# Patient Record
Sex: Female | Born: 1985 | Race: Black or African American | Hispanic: No | Marital: Single | Smoking: Current some day smoker
Health system: Southern US, Community
[De-identification: ages and names within clinical notes are randomized; demographics above are authoritative.]

## PROBLEM LIST (undated history)

## (undated) DIAGNOSIS — N920 Excessive and frequent menstruation with regular cycle: Secondary | ICD-10-CM

## (undated) DIAGNOSIS — N62 Hypertrophy of breast: Secondary | ICD-10-CM

## (undated) DIAGNOSIS — M545 Low back pain, unspecified: Secondary | ICD-10-CM

## (undated) DIAGNOSIS — Z1371 Encounter for nonprocreative screening for genetic disease carrier status: Secondary | ICD-10-CM

## (undated) DIAGNOSIS — Z9189 Other specified personal risk factors, not elsewhere classified: Secondary | ICD-10-CM

## (undated) DIAGNOSIS — U071 COVID-19: Secondary | ICD-10-CM

## (undated) DIAGNOSIS — F419 Anxiety disorder, unspecified: Secondary | ICD-10-CM

## (undated) DIAGNOSIS — D649 Anemia, unspecified: Secondary | ICD-10-CM

## (undated) DIAGNOSIS — G8929 Other chronic pain: Secondary | ICD-10-CM

## (undated) DIAGNOSIS — F329 Major depressive disorder, single episode, unspecified: Secondary | ICD-10-CM

## (undated) DIAGNOSIS — N83209 Unspecified ovarian cyst, unspecified side: Secondary | ICD-10-CM

## (undated) DIAGNOSIS — F32A Depression, unspecified: Secondary | ICD-10-CM

## (undated) HISTORY — DX: Low back pain, unspecified: M54.50

## (undated) HISTORY — DX: Anemia, unspecified: D64.9

## (undated) HISTORY — DX: Hypertrophy of breast: N62

## (undated) HISTORY — DX: Excessive and frequent menstruation with regular cycle: N92.0

## (undated) HISTORY — DX: Other chronic pain: G89.29

## (undated) HISTORY — DX: Other specified personal risk factors, not elsewhere classified: Z91.89

## (undated) HISTORY — DX: Encounter for nonprocreative screening for genetic disease carrier status: Z13.71

## (undated) HISTORY — DX: Anxiety disorder, unspecified: F41.9

## (undated) HISTORY — DX: Depression, unspecified: F32.A

---
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
## 2005-02-02 DIAGNOSIS — N83209 Unspecified ovarian cyst, unspecified side: Secondary | ICD-10-CM

## 2005-02-02 HISTORY — DX: Unspecified ovarian cyst, unspecified side: N83.209

## 2005-02-02 HISTORY — PX: LAPAROTOMY: SHX154

## 2005-12-14 ENCOUNTER — Inpatient Hospital Stay: Payer: Self-pay | Admitting: Unknown Physician Specialty

## 2005-12-14 IMAGING — US US OB < 14 WEEKS - US OB TV
1 series · 17 of 28 positions shown · non-contrast
Comparison: none

REASON FOR EXAM: Abdominal pain, eval ectopic pregnancy       rm 11
COMMENTS:

[Series 1: us ob < 14 weeks - us ob tv · 17 of 78 slices shown]
[im 1/78]
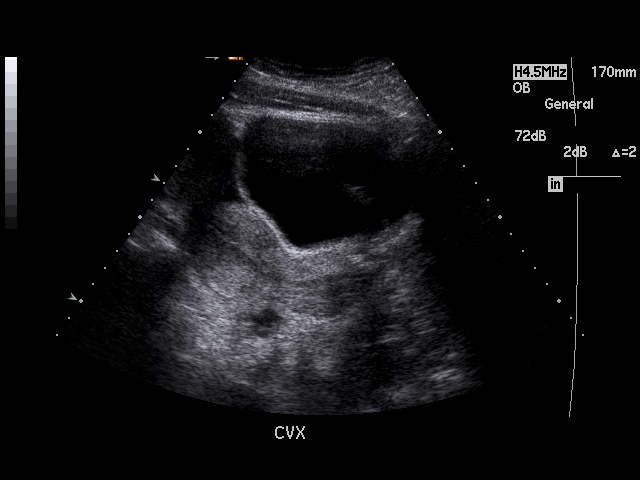
[im 6/78]
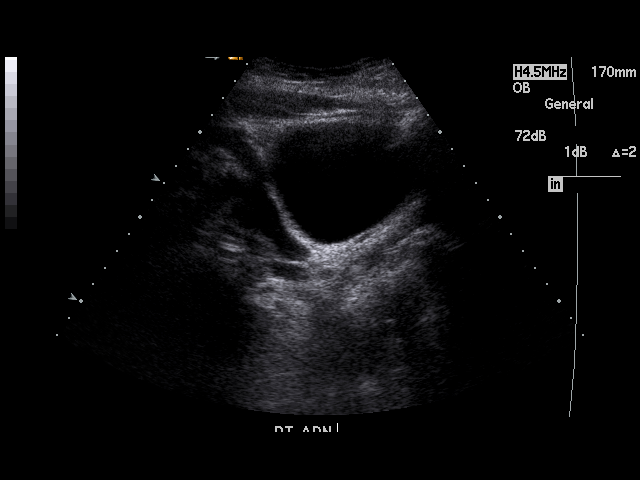
[im 12/78]
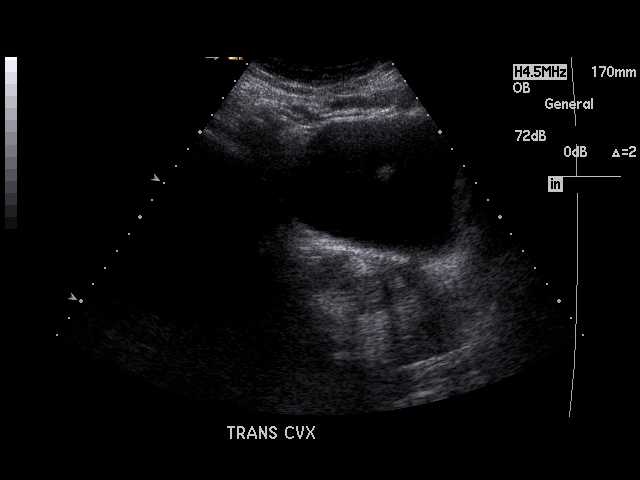
[im 15/78]
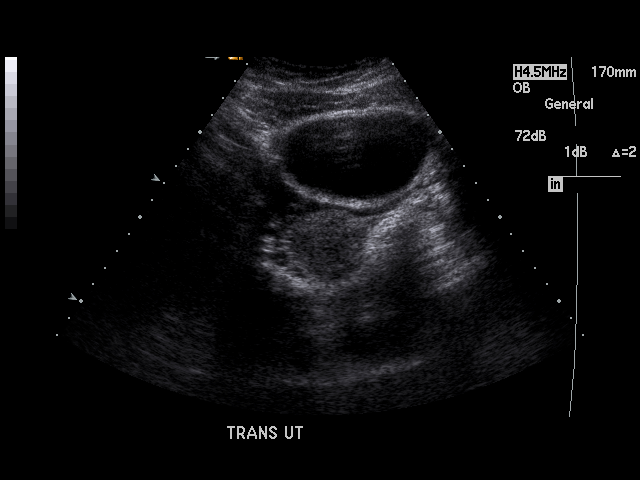
[im 20/78]
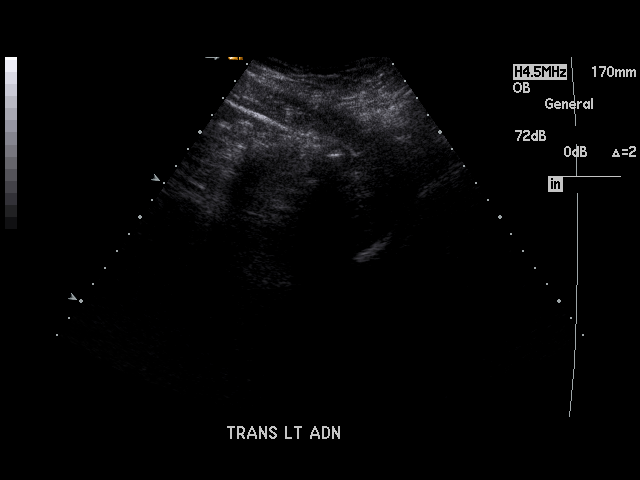
[im 26/78]
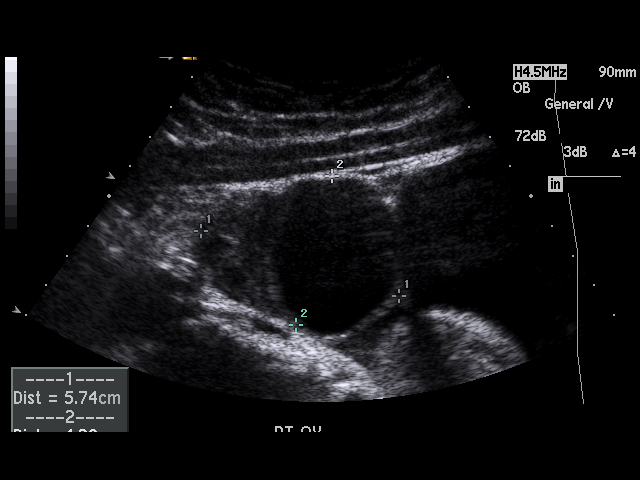
[im 29/78]
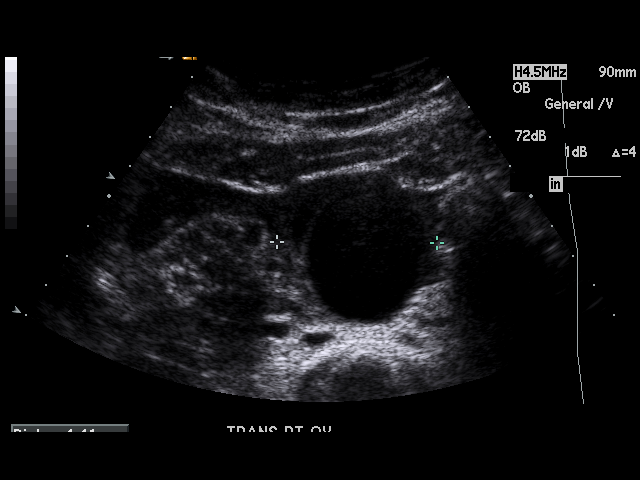
[im 35/78]
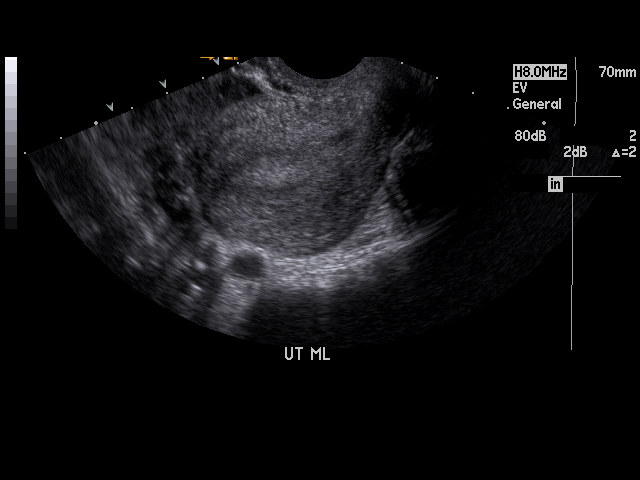
[im 40/78]
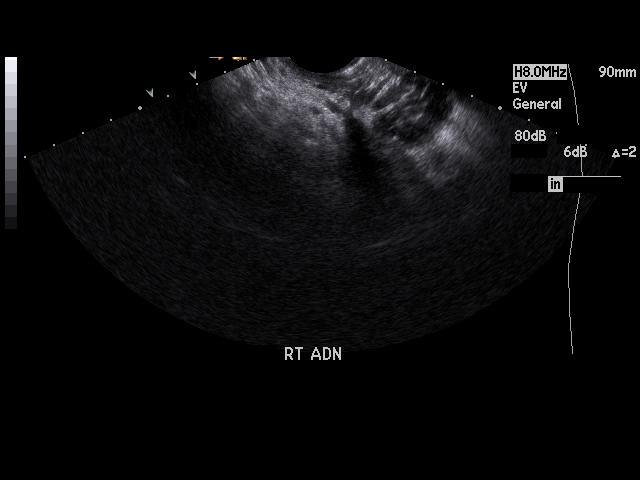
[im 43/78]
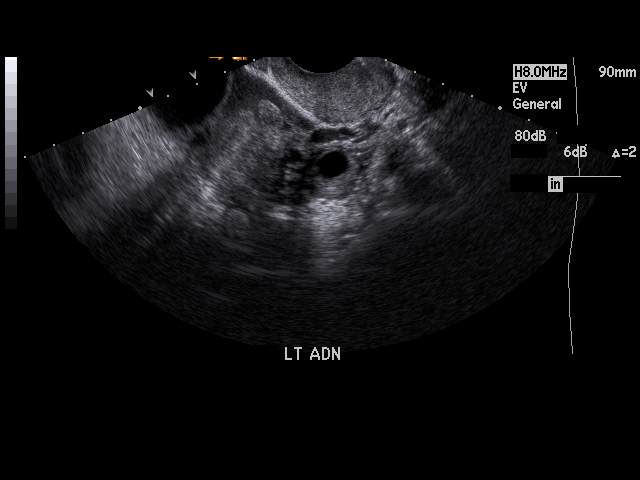
[im 49/78]
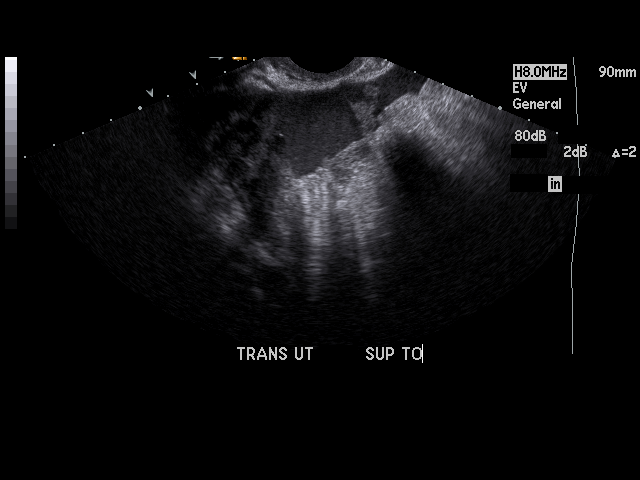
[im 52/78]
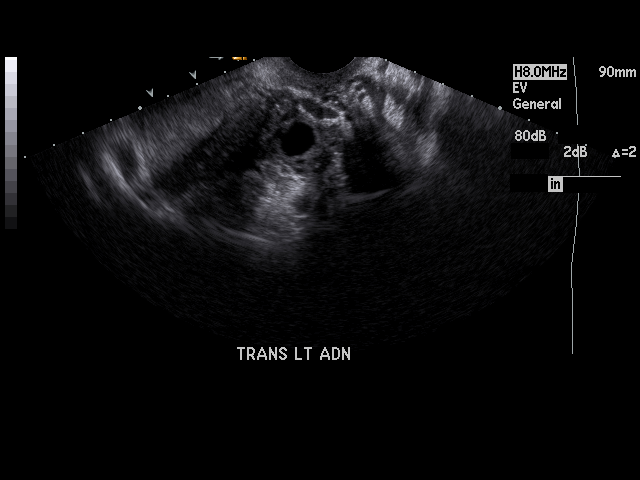
[im 58/78]
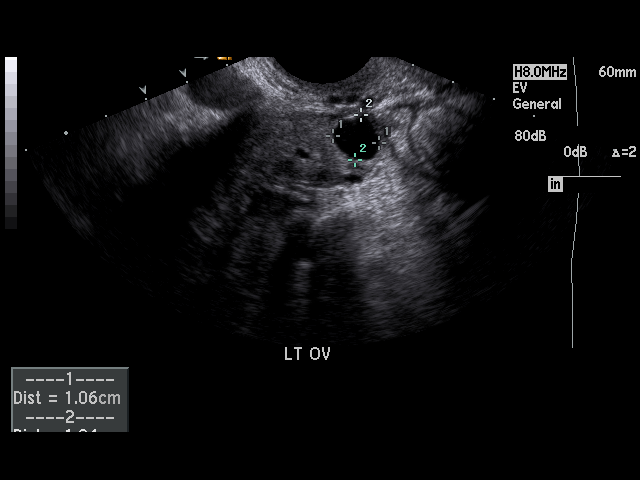
[im 63/78]
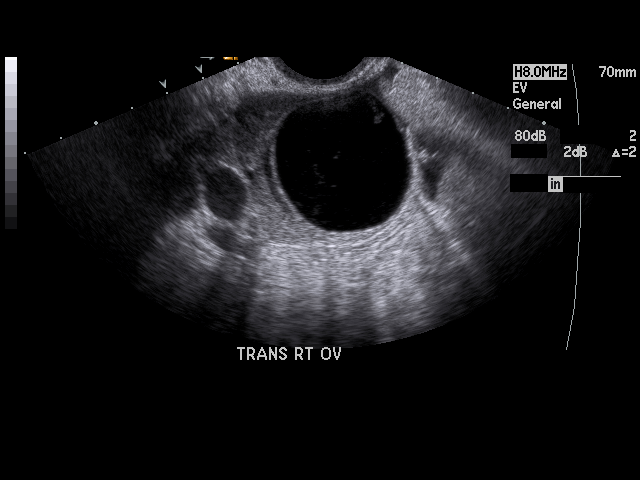
[im 66/78]
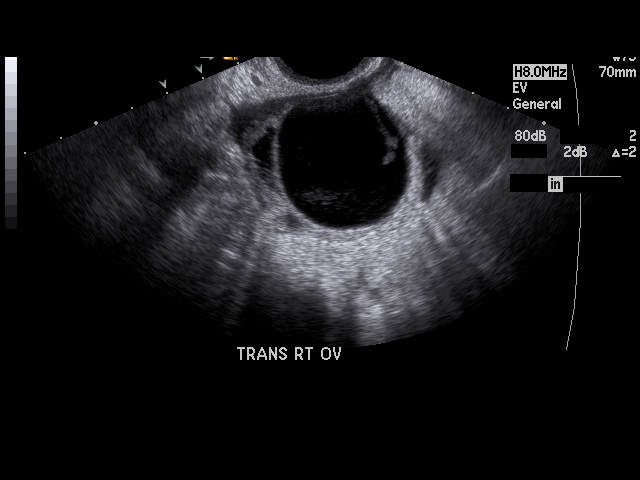
[im 72/78]
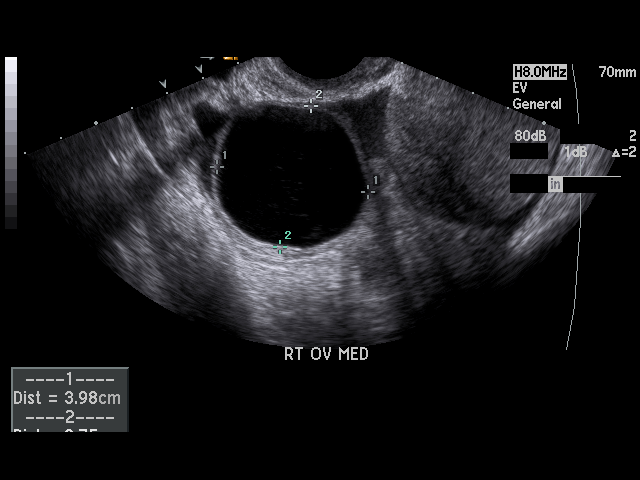
[im 78/78]
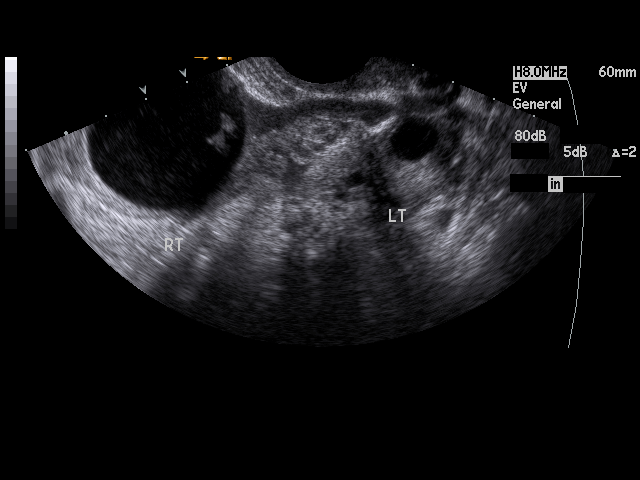

[17 of 28 positions shown; findings below may reference images not displayed]

PROCEDURE:     US  - US OB LESS THAN 14 WEEKS  - [DATE]  [DATE]

RESULT:        Real-time imaging was obtained on an emergency basis for
possible ectopic.  The patient has a positive pregnancy test.

The uterus is identified with no intrauterine gestational sac noted.  There
is seen a small LEFT ovarian cyst measuring 2.36 x 1.3 cm.  In the RIGHT
ovary is a cyst measuring 4.0 x 3.4 x 3.8 cm with some internal echo.  A
small amount of fluid is noted in the cul-de-sac region.  There is flow to
both ovaries.
IMPRESSION: 1.     Empty uterus.
2.     Large RIGHT ovarian cyst with hemorrhage or debris.  Follow-up
recommended.   Small amount of fluid in the cul-de-sac.  Cannot exclude an
ectopic and follow-up ultrasound with serial Beta HCG would be recommended.

## 2005-12-22 ENCOUNTER — Ambulatory Visit: Payer: Self-pay | Admitting: Unknown Physician Specialty

## 2006-09-03 ENCOUNTER — Observation Stay: Payer: Self-pay

## 2006-11-30 ENCOUNTER — Inpatient Hospital Stay: Payer: Self-pay

## 2007-04-15 ENCOUNTER — Emergency Department: Payer: Self-pay | Admitting: Unknown Physician Specialty

## 2008-01-08 ENCOUNTER — Emergency Department: Payer: Self-pay | Admitting: Emergency Medicine

## 2010-11-03 ENCOUNTER — Emergency Department: Payer: Self-pay

## 2010-11-03 IMAGING — US US PELV - US TRANSVAGINAL
1 series · 14 of 25 positions shown · non-contrast
Comparison: none

REASON FOR EXAM: pelvic pain
COMMENTS:

[Series 1: us pelv - us transvaginal · 0.35mm/px · 14 of 98 slices shown]
[im 1/98]
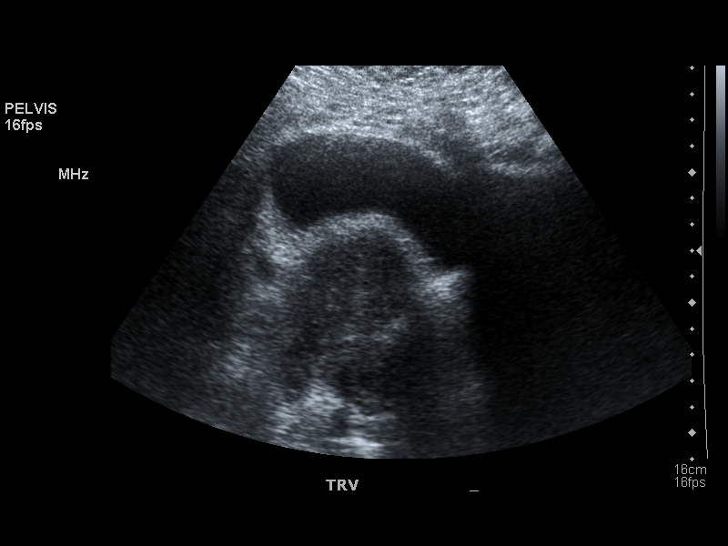
[im 9/98]
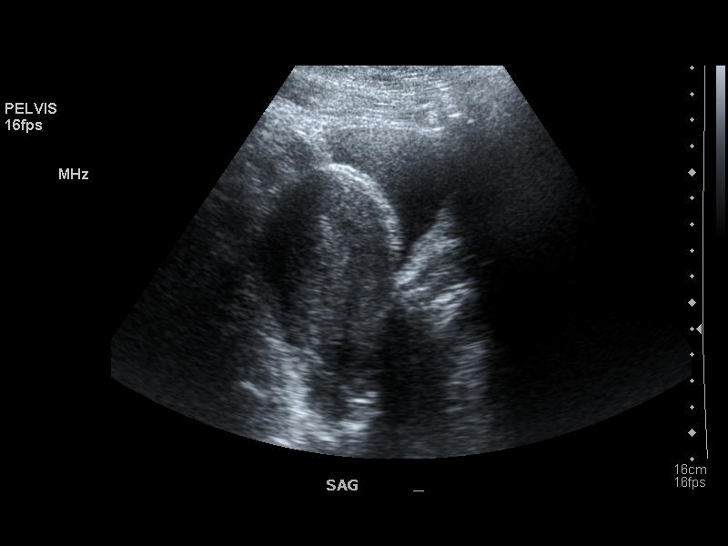
[im 17/98]
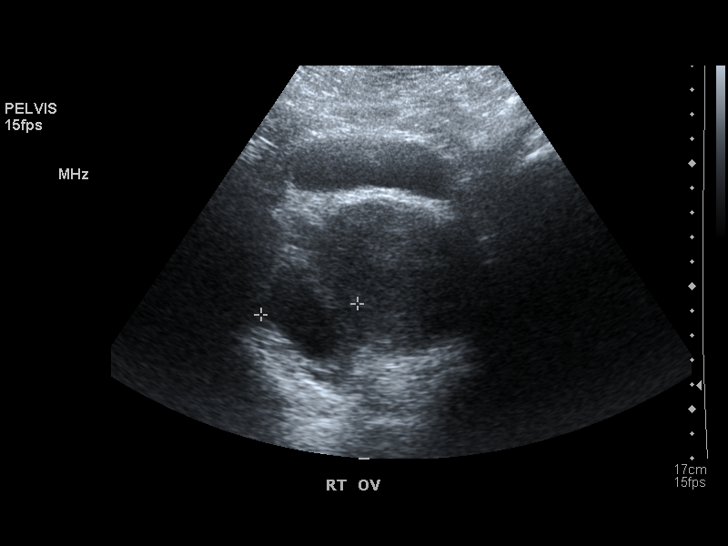
[im 25/98]
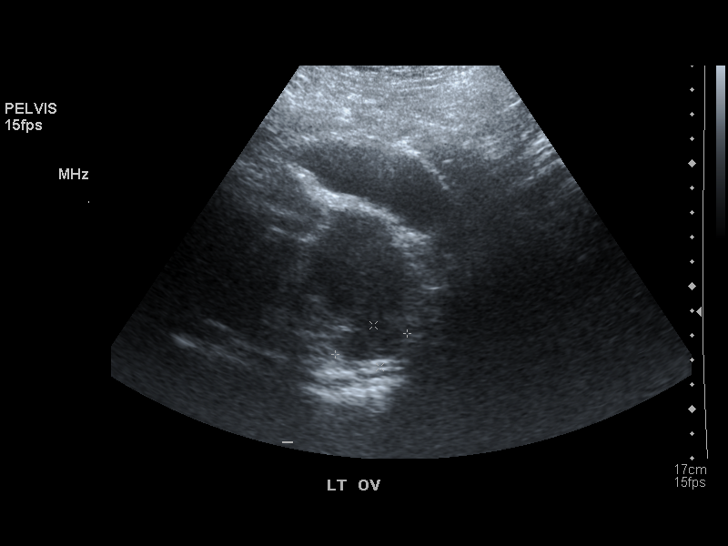
[im 33/98]
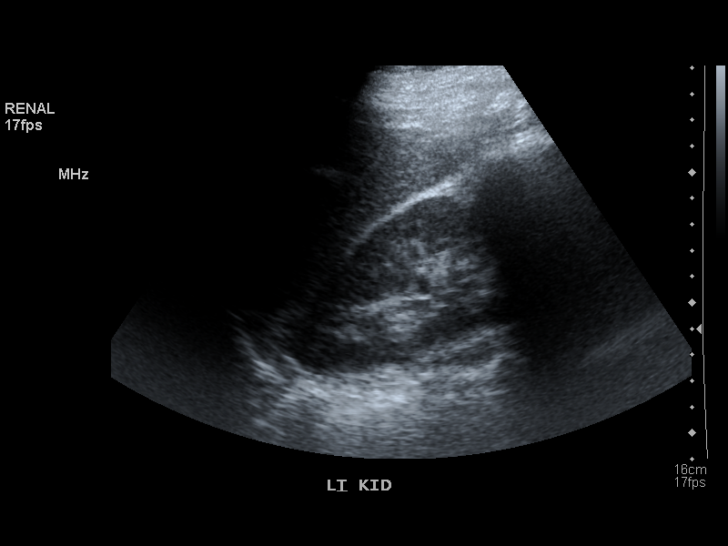
[im 37/98]
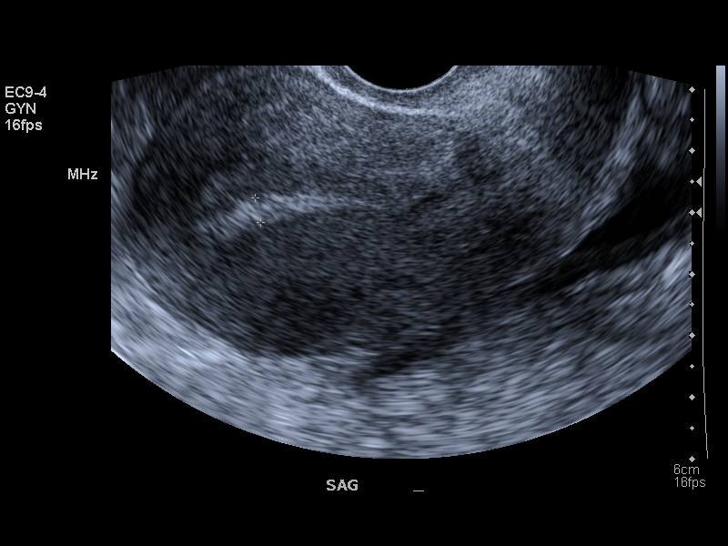
[im 45/98]
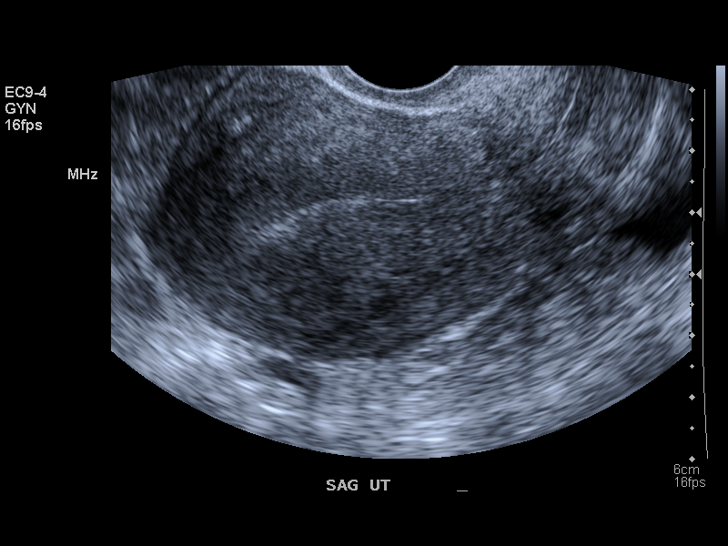
[im 53/98]
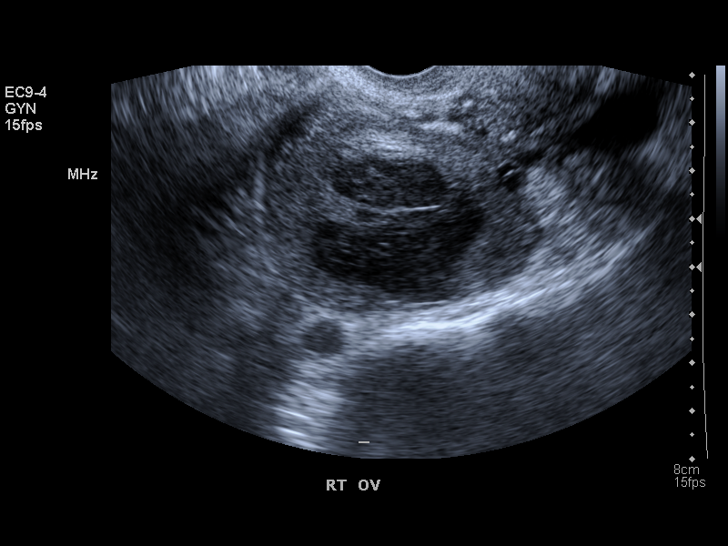
[im 61/98]
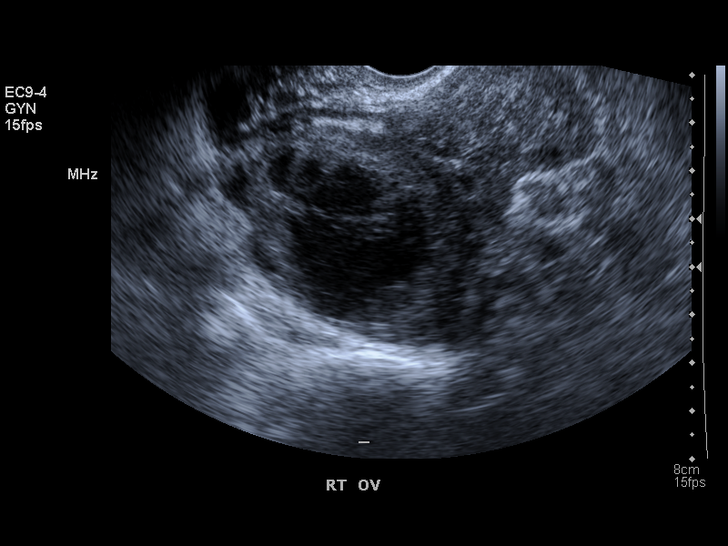
[im 65/98]
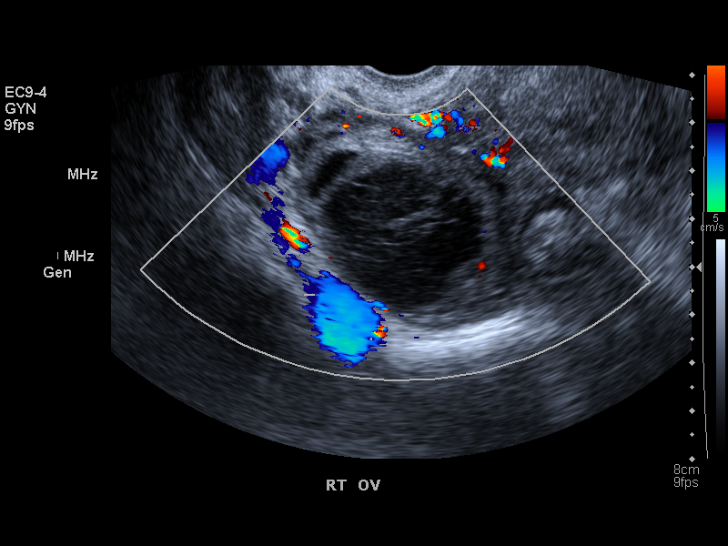
[im 73/98]
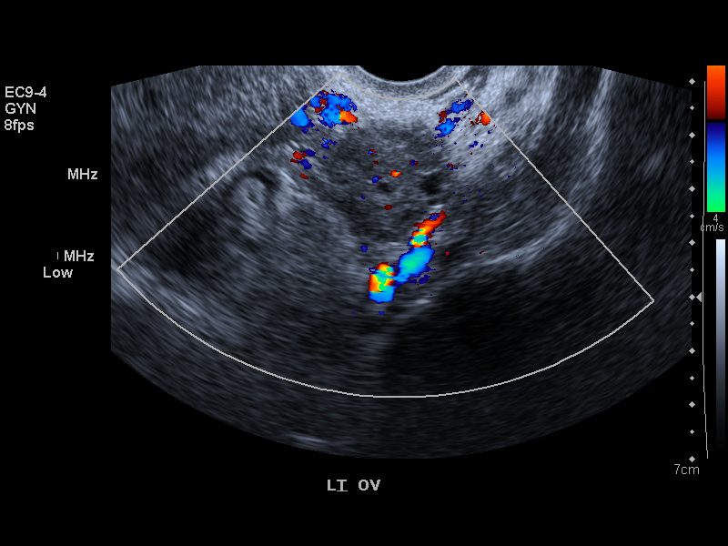
[im 81/98]
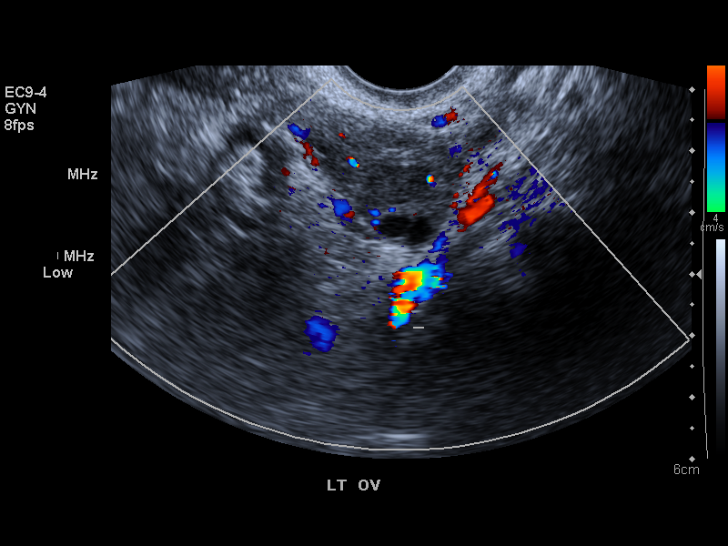
[im 89/98]
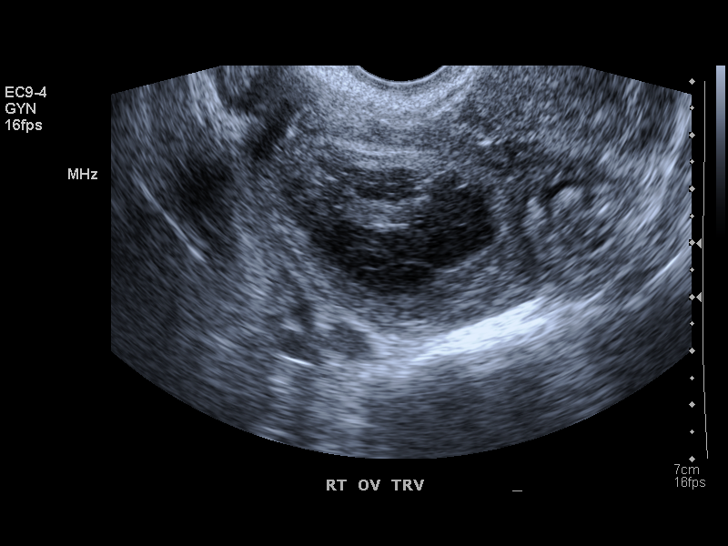
[im 98/98]
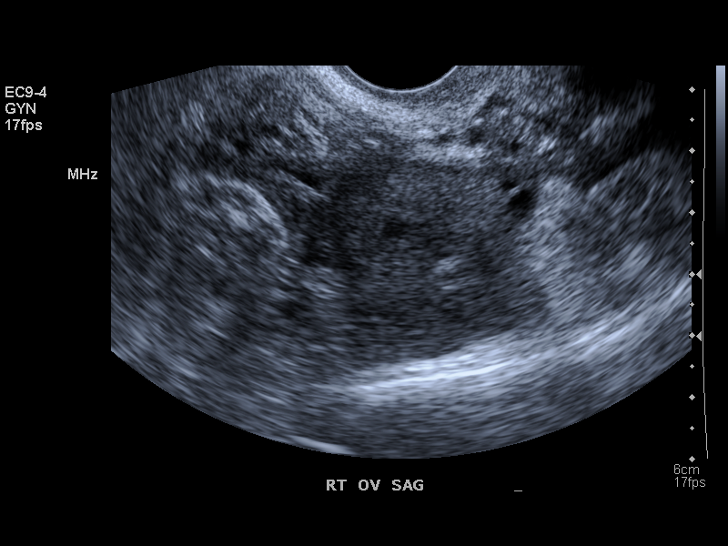

[14 of 25 positions shown; findings below may reference images not displayed]

PROCEDURE:     US  - US PELVIS EXAM W/TRANSVAGINAL  - [DATE]  [DATE]

RESULT:     Emergent pelvic sonogram is performed with transabdominal and
endovaginal scanning. The study demonstrates the uterus measures 8.67 x
x 5.10 cm transabdominally. There is a complex area of echotexture involving
the right adnexal region measuring 3.26 x 3.66 x 2.58 cm. The ovary overall
measures approximately 3.25 x 5.22 cm. The left ovary measures 3.13 x 2.2 x
1.73 cm and shows a hypoechoic 1.3 x 1.14 x 0.77 cm area area color and
spectral Doppler interrogation documents arterial and venous flow to both
ovaries. There is no free fluid or abnormal fluid collection other than a
trace of cul-de-sac fluid which is nonspecific. The ovaries are otherwise
unremarkable. The kidneys show a normal appearance on survey exam without
evidence of obstruction. The endometrial stripe thickness is 4.1 mm.
IMPRESSION: Complex predominately hypoechoic area involving the right
ovary. Small hypoechoic area left ovary. Trace cul-de-sac fluid. Followup is
available after clinical correlation if desired.

## 2011-01-09 ENCOUNTER — Emergency Department: Payer: Self-pay | Admitting: Emergency Medicine

## 2011-01-09 IMAGING — CT CT ABD-PELV W/ CM
1 of 2 series · 15 of 32 positions shown, 19 images · non-contrast
Comparison: none

REASON FOR EXAM: (1) RLQ pain; (2) RLQ pain
COMMENTS:   May transport without cardiac monitor

PROCEDURE:     CT  - CT ABDOMEN / PELVIS  W  - [DATE]  [DATE]
RESULT:     History: Right lower quadrant pain.
Comparison Study: No prior.

[Series 2: 3mm soft tissue · axial · 0.68mm/px · z∈[-442,+17]mm · 15 of 167 slices shown, 19 images]
[im 7/167  soft-tissue]
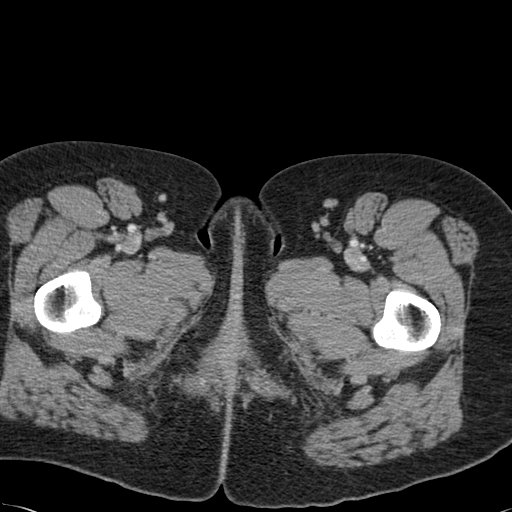
[im 7/167  bone]
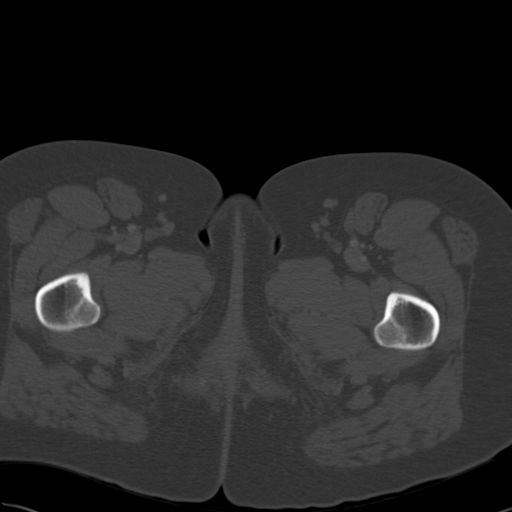
[im 21/167  soft-tissue]
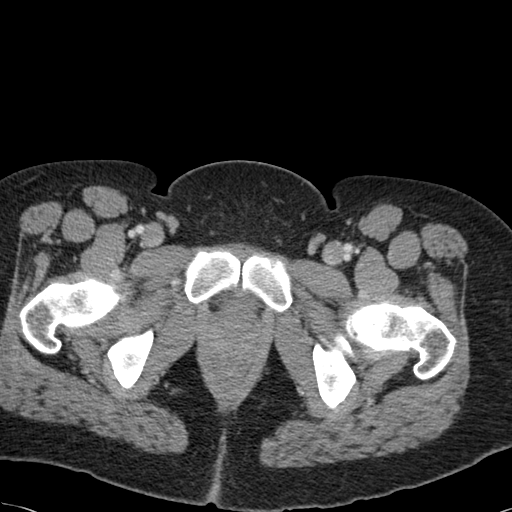
[im 35/167  soft-tissue]
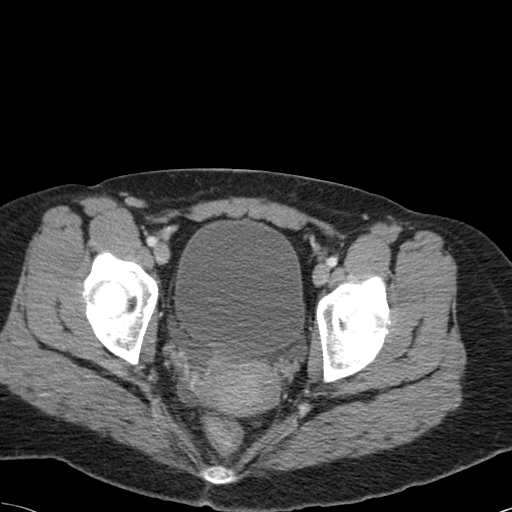
[im 49/167  soft-tissue]
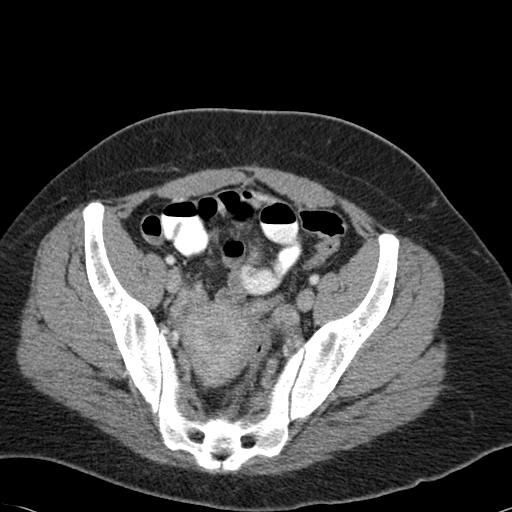
[im 56/167  soft-tissue]
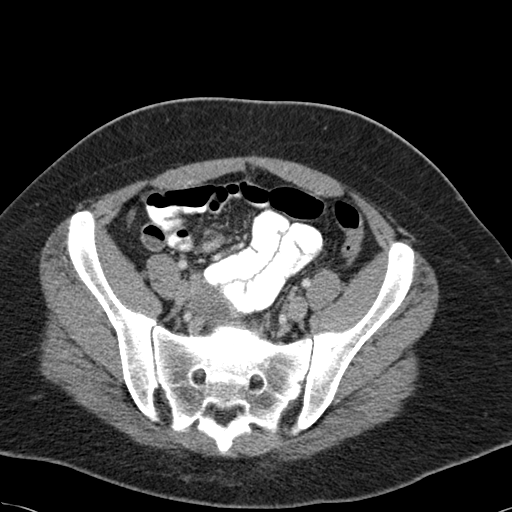
[im 70/167  soft-tissue]
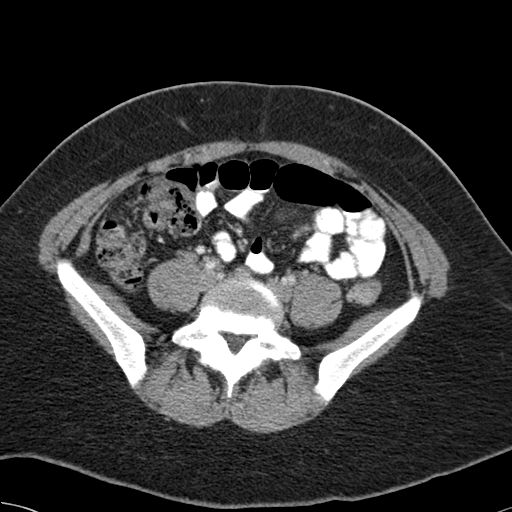
[im 84/167  soft-tissue]
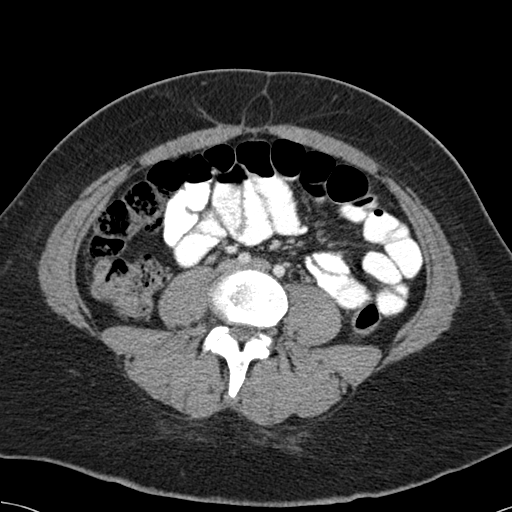
[im 97/167  soft-tissue]
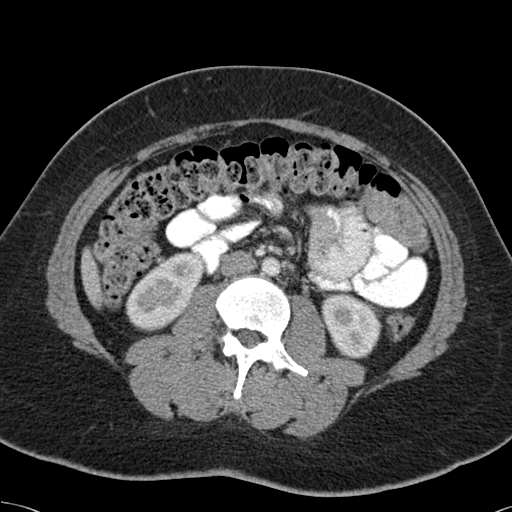
[im 111/167  soft-tissue]
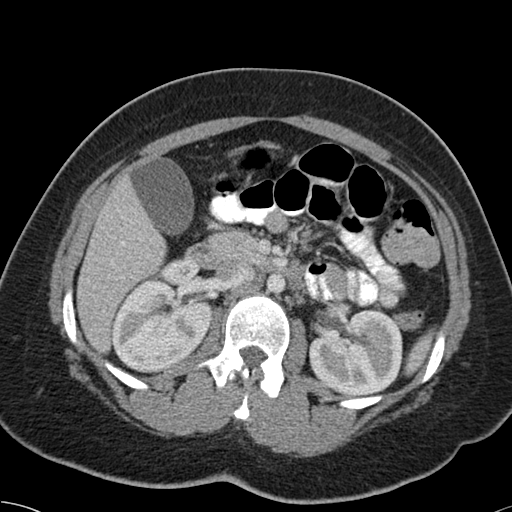
[im 111/167  bone]
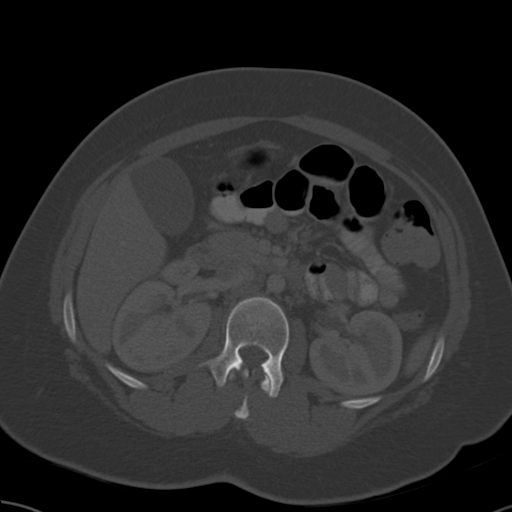
[im 118/167  soft-tissue]
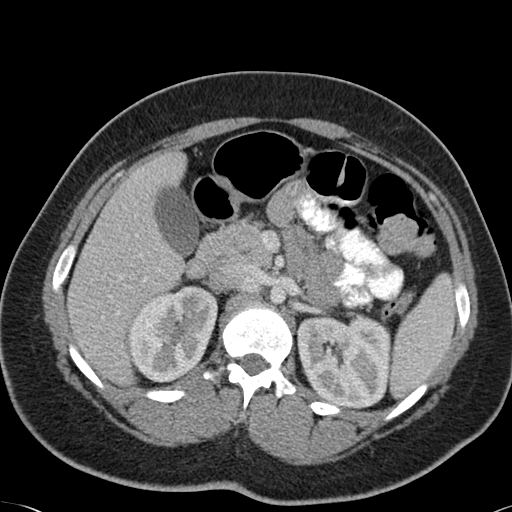
[im 132/167  soft-tissue]
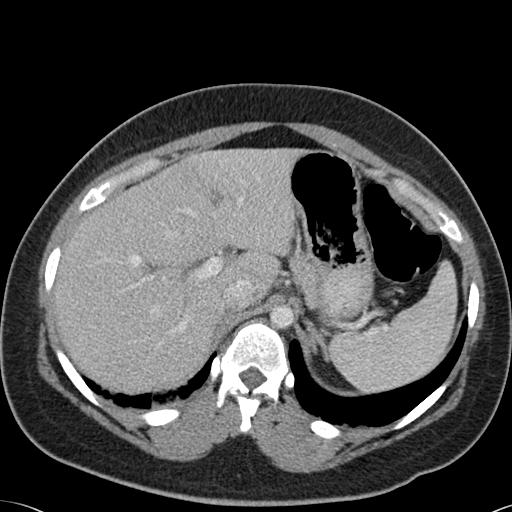
[im 139/167  lung]
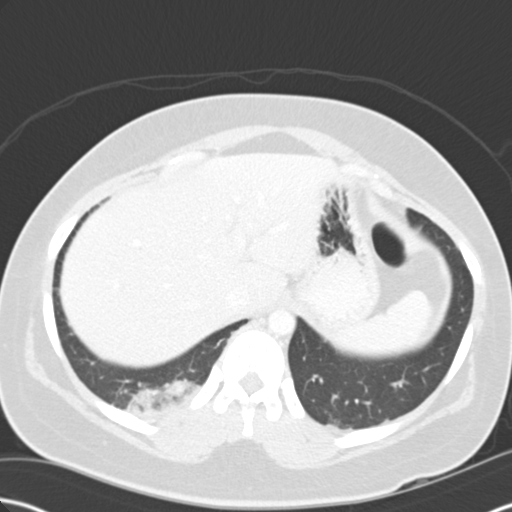
[im 146/167  soft-tissue]
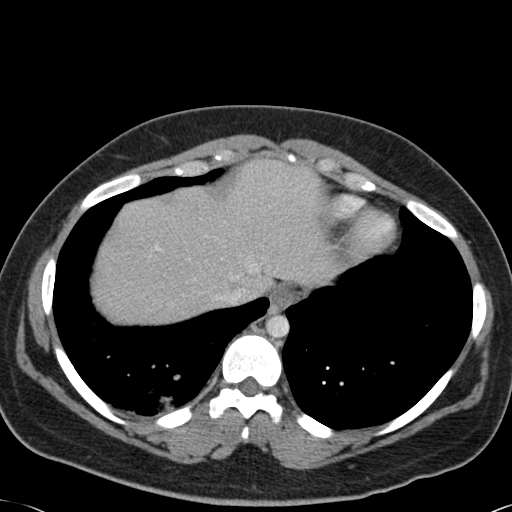
[im 146/167  lung]
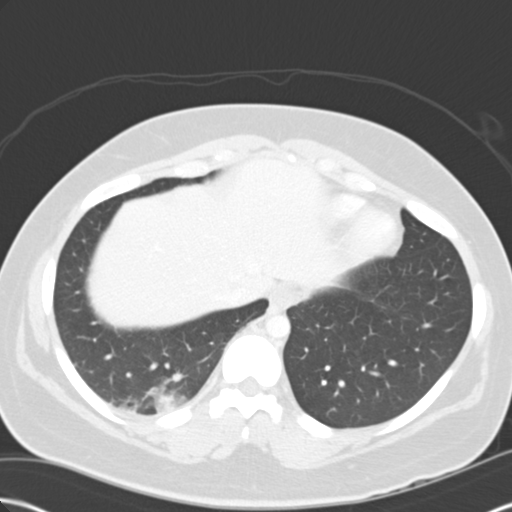
[im 153/167  lung]
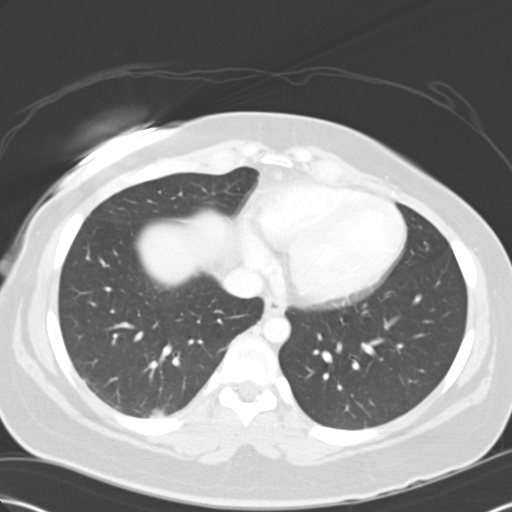
[im 160/167  soft-tissue]
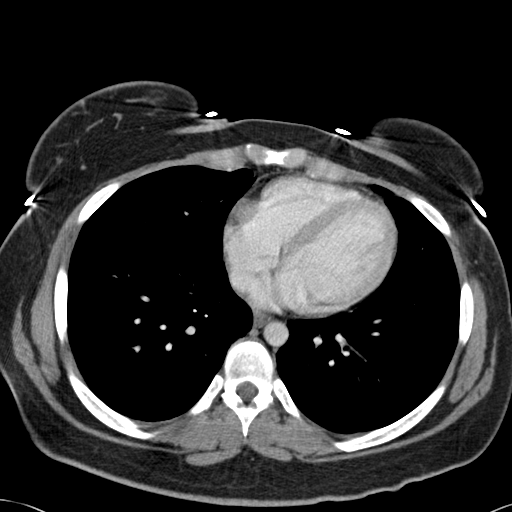
[im 160/167  lung]
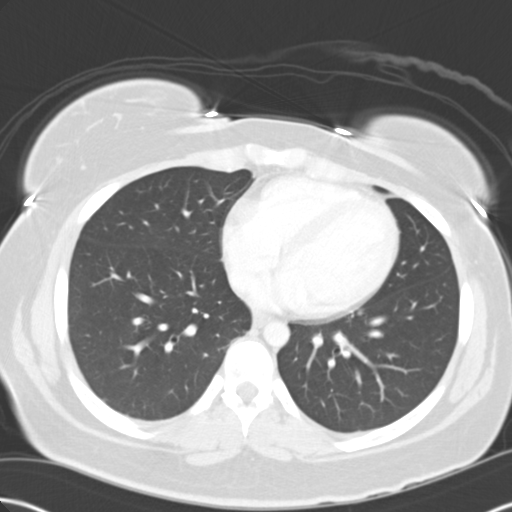

[15 of 32 positions shown; findings below may reference images not displayed]

FINDINGS: Standard CT obtained with 100 cc of [G1]. Liver normal.
Spleen normal. Gallbladder nondistended. Mild prominence of the pancreatic
duct noted. No pancreatic mass noted. It might be wise to perform ERCP with
endoscopic ultrasound of the pancreas for further evaluation. Adrenals
normal. Kidneys normal. Aorta nondistended. Appendix normal. Bilateral
adnexal cysts are present. Endometrial thickening is noted, this may be
cyclical in this patient. Bladder nondistended. Small inguinal lymph nodes
noted. Mild prominence of small and large bowel. Adynamic ileus cannot be
excluded. Right lower lobe infiltrate consistent with pneumonia.
IMPRESSION: 1. Bilateral adnexal cystic structures. Endometrial thickening. These
changes could be cyclical. Pelvic ultrasound and pregnancy test can be
obtained for further evaluation if clinically indicated.
2. Mild prominence of small large bowel. Mild adynamic ileus cannot be
excluded.
3. Small amount of free pelvic fluid.
4. Right lower lobe pneumonia.
5. Mild prominence of the pancreatic duct. No focal pancreatic mass noted.
However may be wide perform ERCP end endoscopic ultrasound of the pancreas
for further evaluation.

## 2012-01-19 ENCOUNTER — Emergency Department: Payer: Self-pay | Admitting: Emergency Medicine

## 2012-01-19 LAB — RAPID INFLUENZA A&B ANTIGENS

## 2012-01-21 LAB — BETA STREP CULTURE(ARMC)

## 2012-05-02 ENCOUNTER — Emergency Department: Payer: Self-pay | Admitting: Emergency Medicine

## 2012-05-02 LAB — URINALYSIS, COMPLETE
Bilirubin,UR: NEGATIVE
Blood: NEGATIVE
Glucose,UR: NEGATIVE mg/dL (ref 0–75)
Ketone: NEGATIVE
Nitrite: NEGATIVE
Protein: 30
RBC,UR: 7 /HPF (ref 0–5)
Squamous Epithelial: 2

## 2012-05-02 LAB — CBC
HCT: 40.8 % (ref 35.0–47.0)
HGB: 13.6 g/dL (ref 12.0–16.0)
MCHC: 33.4 g/dL (ref 32.0–36.0)
Platelet: 279 10*3/uL (ref 150–440)
RBC: 4.48 10*6/uL (ref 3.80–5.20)
RDW: 12.9 % (ref 11.5–14.5)
WBC: 13.3 10*3/uL — ABNORMAL HIGH (ref 3.6–11.0)

## 2012-05-02 LAB — COMPREHENSIVE METABOLIC PANEL
Alkaline Phosphatase: 71 U/L (ref 50–136)
Anion Gap: 4 — ABNORMAL LOW (ref 7–16)
BUN: 11 mg/dL (ref 7–18)
Bilirubin,Total: 0.2 mg/dL (ref 0.2–1.0)
Calcium, Total: 8.4 mg/dL — ABNORMAL LOW (ref 8.5–10.1)
Chloride: 109 mmol/L — ABNORMAL HIGH (ref 98–107)
Co2: 26 mmol/L (ref 21–32)
EGFR (Non-African Amer.): >60
Glucose: 88 mg/dL (ref 65–99)
SGOT(AST): 26 U/L (ref 15–37)
Total Protein: 8.3 g/dL — ABNORMAL HIGH (ref 6.4–8.2)

## 2012-05-02 LAB — WET PREP, GENITAL

## 2012-05-02 IMAGING — US US PELV - US TRANSVAGINAL
1 series · 14 of 25 positions shown · non-contrast
Comparison: none

REASON FOR EXAM: pain
COMMENTS:

[Series 1: us pelv - us transvaginal · 0.26mm/px · 14 of 111 slices shown]
[im 1/111]
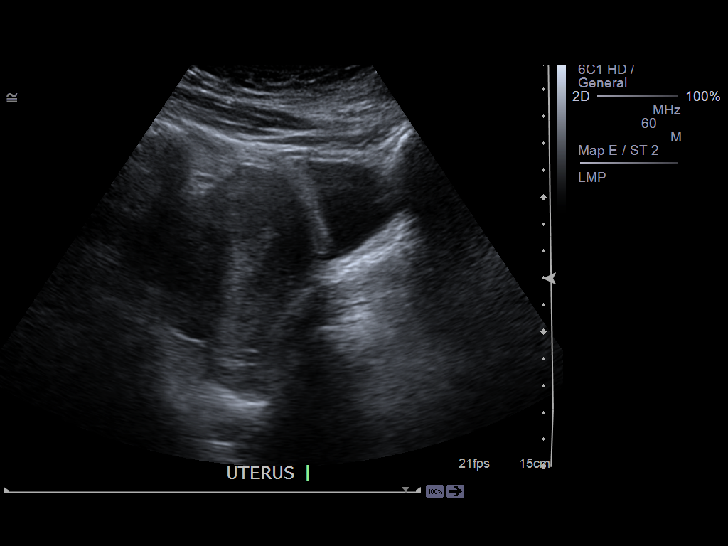
[im 10/111]
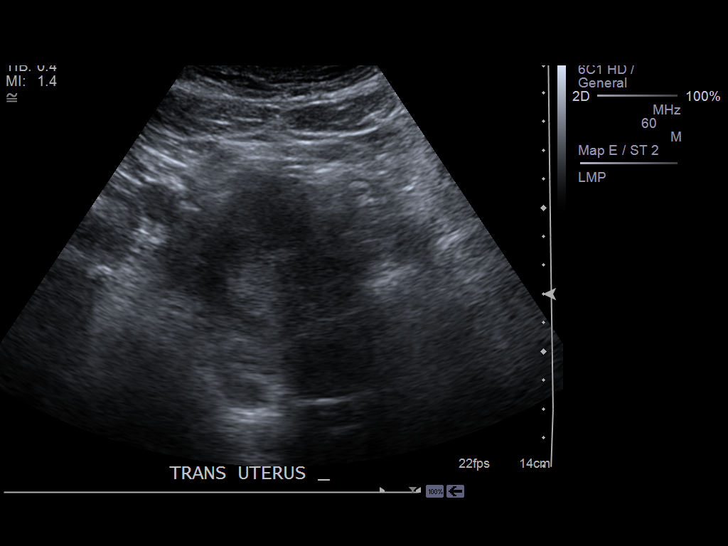
[im 19/111]
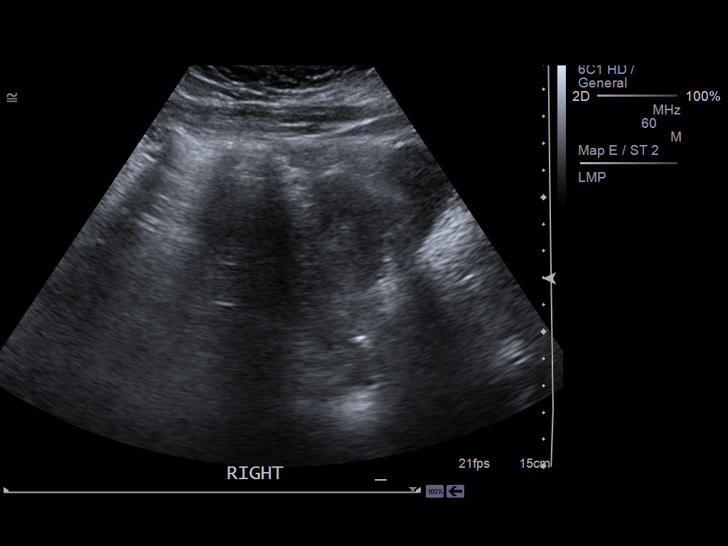
[im 28/111]
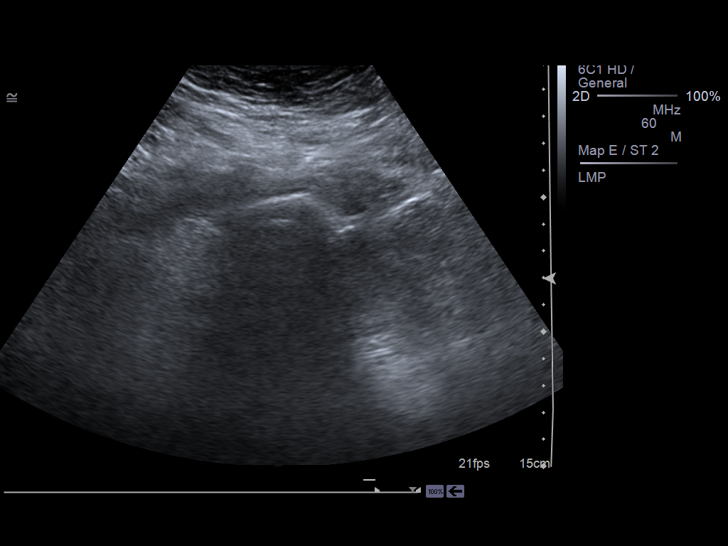
[im 37/111]
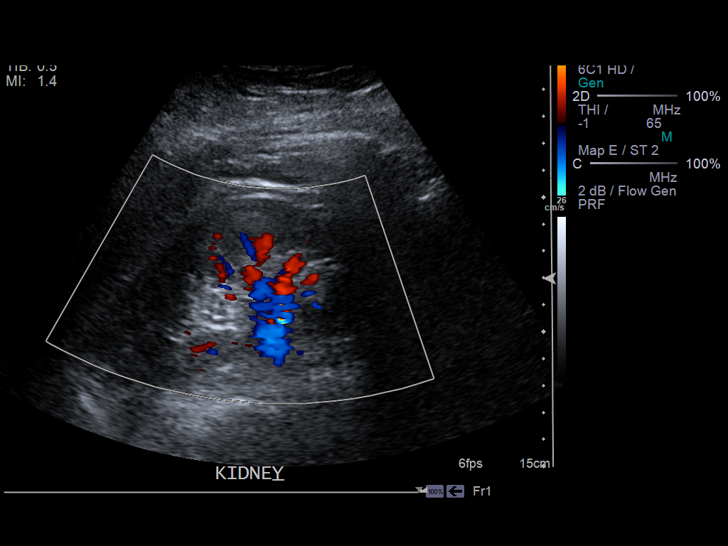
[im 42/111]
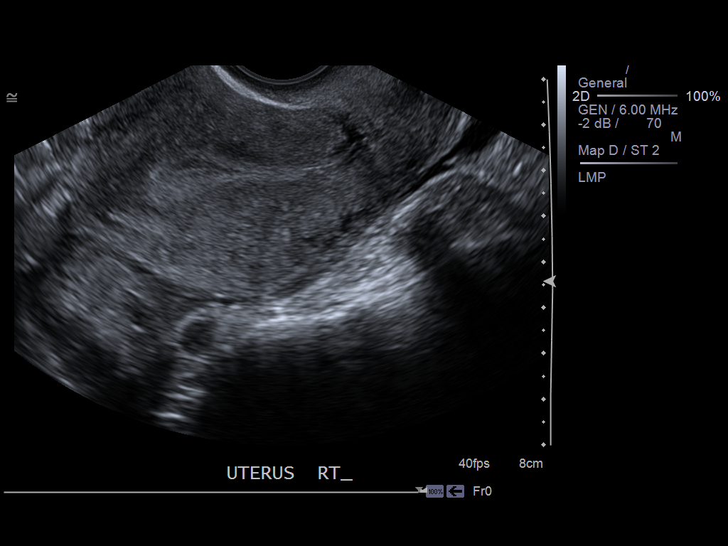
[im 51/111]
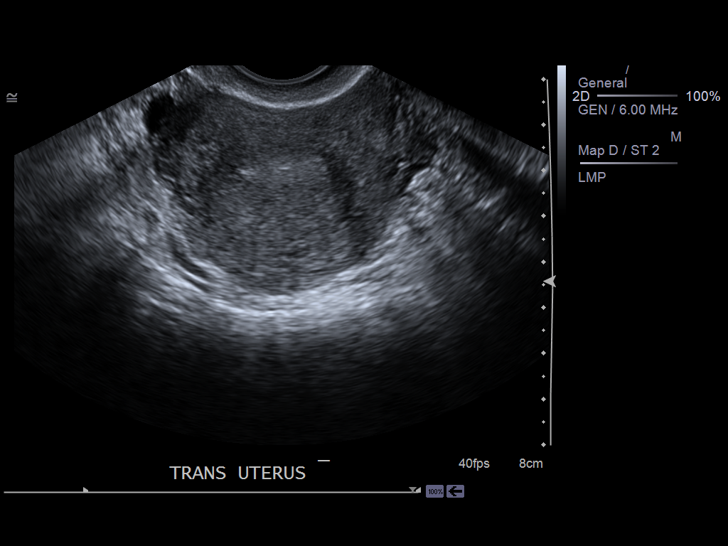
[im 60/111]
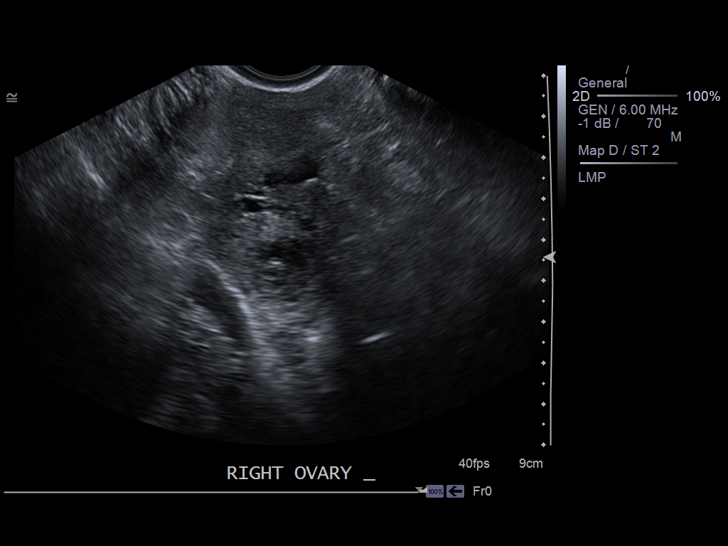
[im 69/111]
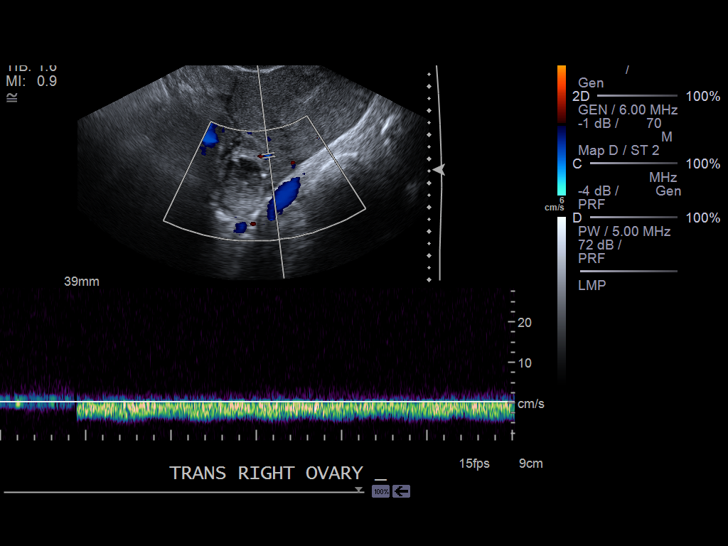
[im 74/111]
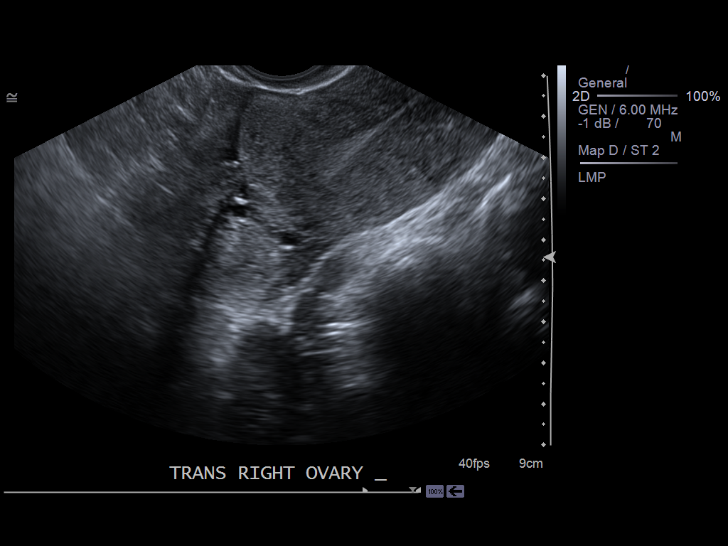
[im 83/111]
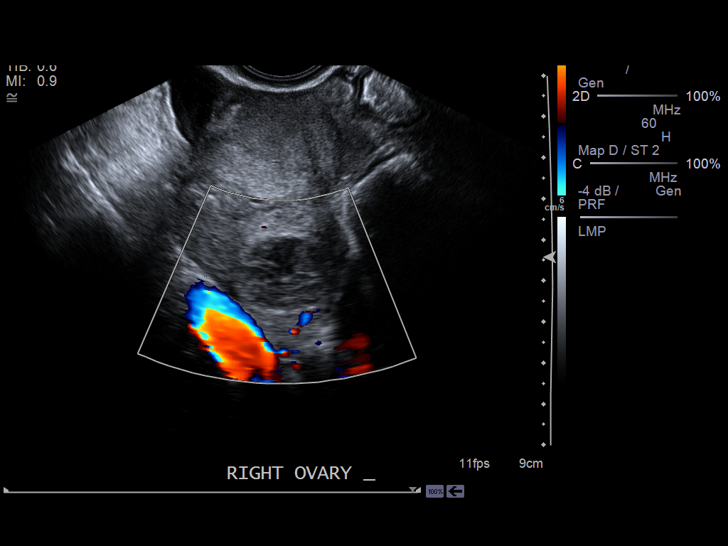
[im 92/111]
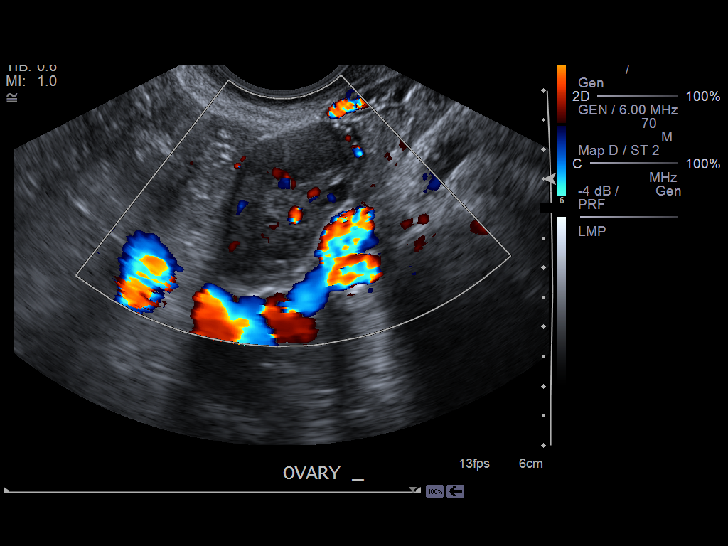
[im 101/111]
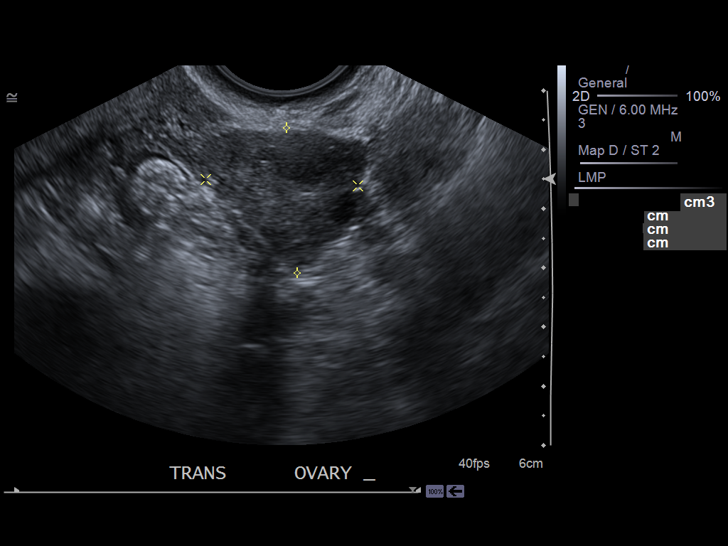
[im 111/111]
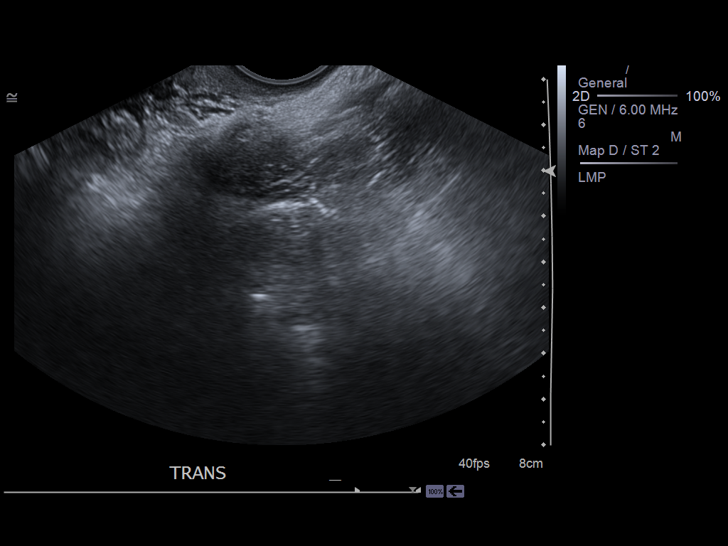

[14 of 25 positions shown; findings below may reference images not displayed]

PROCEDURE:     US  - US PELVIS EXAM W/TRANSVAGINAL  - [DATE]  [DATE]

RESULT:     Pelvic sonogram is performed with transabdominal and endovaginal
scanning. The uterus measures 8.51 x 7.00 x 6.06 cm. On transabdominal
images the endometrial stripe thickness is 7.9 mm. There is a trace amount
of free fluid in the rectouterine cul-de-sac. The kidneys are grossly
normal. The right ovary measures 4.43 I 2.74 x 3.19 cm. There is arterial
and venous flow with spectral Doppler interrogation of the right ovary. K
1.70 x 1.90 x 2.08 cm hypoechoic with internal echo structure may represent
hemorrhagic cyst. Left ovary measures 3.40 x 2.57 x 2.46 cm size with
spectral Doppler imaging showing arterial and venous flow. Small follicles
are present.
IMPRESSION: 1. Possible small hemorrhagic cyst in the right ovary. Otherwise,
unremarkable appearance. Small amount of free fluid present.

[REDACTED]

## 2012-08-26 ENCOUNTER — Ambulatory Visit: Payer: Self-pay

## 2012-09-02 ENCOUNTER — Ambulatory Visit: Payer: Self-pay

## 2013-01-07 ENCOUNTER — Emergency Department: Payer: Self-pay | Admitting: Emergency Medicine
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
## 2013-08-12 ENCOUNTER — Emergency Department: Payer: Self-pay | Admitting: Emergency Medicine

## 2013-08-12 LAB — CBC WITH DIFFERENTIAL/PLATELET
BASOS ABS: 0.1 10*3/uL (ref 0.0–0.1)
Basophil %: 0.5 %
Eosinophil #: 0.2 10*3/uL (ref 0.0–0.7)
Eosinophil %: 1.2 %
HCT: 37.4 % (ref 35.0–47.0)
HGB: 12.4 g/dL (ref 12.0–16.0)
LYMPHS PCT: 23.1 %
Lymphocyte #: 3.3 10*3/uL (ref 1.0–3.6)
MCH: 30.6 pg (ref 26.0–34.0)
MCHC: 33.2 g/dL (ref 32.0–36.0)
MCV: 92 fL (ref 80–100)
Monocyte #: 1.2 x10 3/mm — ABNORMAL HIGH (ref 0.2–0.9)
Monocyte %: 8.2 %
NEUTROS ABS: 9.5 10*3/uL — AB (ref 1.4–6.5)
Neutrophil %: 67 %
Platelet: 254 10*3/uL (ref 150–440)
RBC: 4.06 10*6/uL (ref 3.80–5.20)
RDW: 12.7 % (ref 11.5–14.5)
WBC: 14.2 10*3/uL — AB (ref 3.6–11.0)

## 2013-08-12 LAB — COMPREHENSIVE METABOLIC PANEL
ALBUMIN: 3.4 g/dL (ref 3.4–5.0)
ALT: 23 U/L (ref 12–78)
ANION GAP: 7 (ref 7–16)
Alkaline Phosphatase: 56 U/L
BUN: 5 mg/dL — AB (ref 7–18)
Bilirubin,Total: 0.2 mg/dL (ref 0.2–1.0)
CHLORIDE: 104 mmol/L (ref 98–107)
Calcium, Total: 8.6 mg/dL (ref 8.5–10.1)
Co2: 26 mmol/L (ref 21–32)
Creatinine: 0.58 mg/dL — ABNORMAL LOW (ref 0.60–1.30)
EGFR (Non-African Amer.): >60
Glucose: 98 mg/dL (ref 65–99)
OSMOLALITY: 271 (ref 275–301)
POTASSIUM: 3.5 mmol/L (ref 3.5–5.1)
SGOT(AST): 9 U/L — ABNORMAL LOW (ref 15–37)
SODIUM: 137 mmol/L (ref 136–145)
Total Protein: 7.4 g/dL (ref 6.4–8.2)

## 2013-08-12 LAB — URINALYSIS, COMPLETE
Bilirubin,UR: NEGATIVE
Blood: NEGATIVE
GLUCOSE, UR: NEGATIVE mg/dL (ref 0–75)
Nitrite: NEGATIVE
Ph: 5 (ref 4.5–8.0)
Protein: 30
RBC,UR: 7 /HPF (ref 0–5)
Specific Gravity: 1.029 (ref 1.003–1.030)
Squamous Epithelial: 66
WBC UR: 27 /HPF (ref 0–5)

## 2013-08-12 LAB — HCG, QUANTITATIVE, PREGNANCY: BETA HCG, QUANT.: 35000 m[IU]/mL — AB

## 2013-08-12 IMAGING — US US OB < 14 WEEKS
1 series · 14 of 28 positions shown · non-contrast
Comparison: [DATE]

CLINICAL DATA: Lower abdominal pain, 12 weeks pregnant

EXAM:
OBSTETRIC <14 WK ULTRASOUND
TECHNIQUE: Transabdominal ultrasound was performed for evaluation of the
gestation as well as the maternal uterus and adnexal regions.

[Series 1: us ob < 14 weeks · 0.22mm/px · 31 acquisitions, 14 frames shown]
[im 2/31]
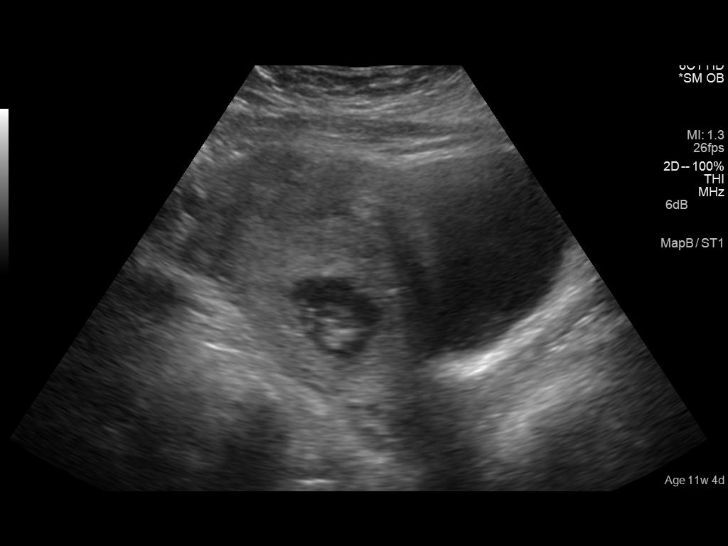
[im 4/31]
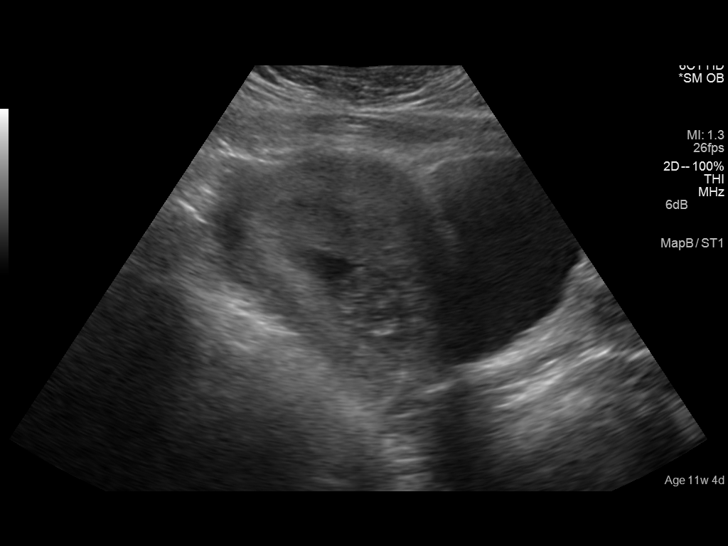
[im 6/31]
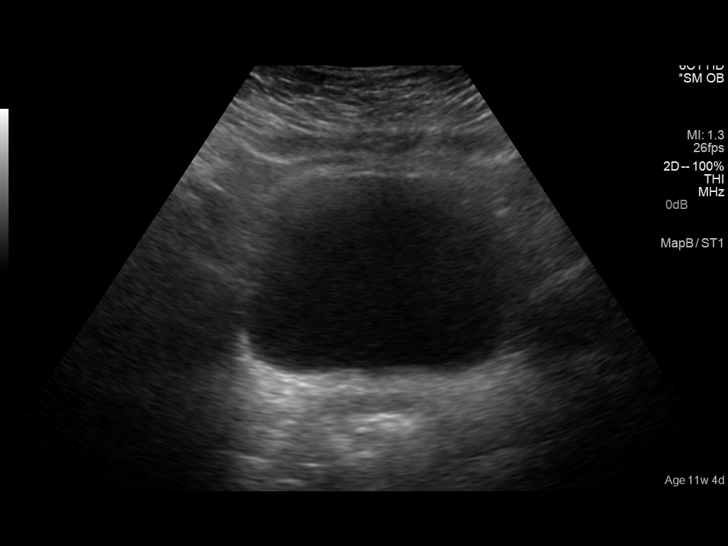
[im 8/31]
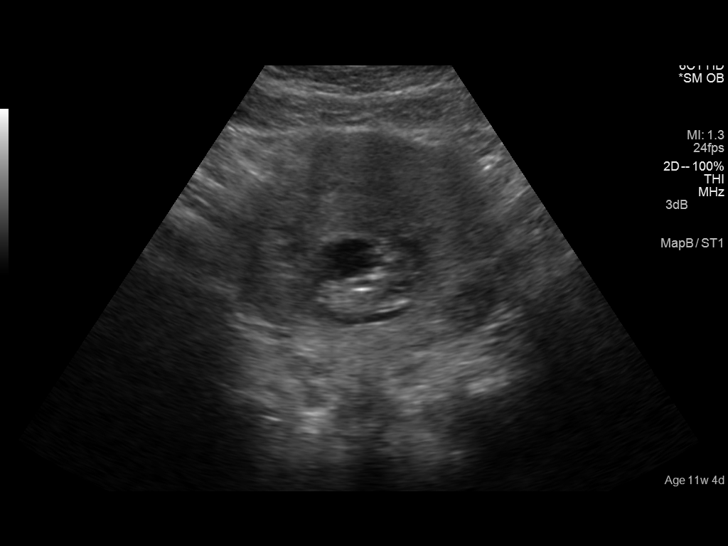
[im 11/31]
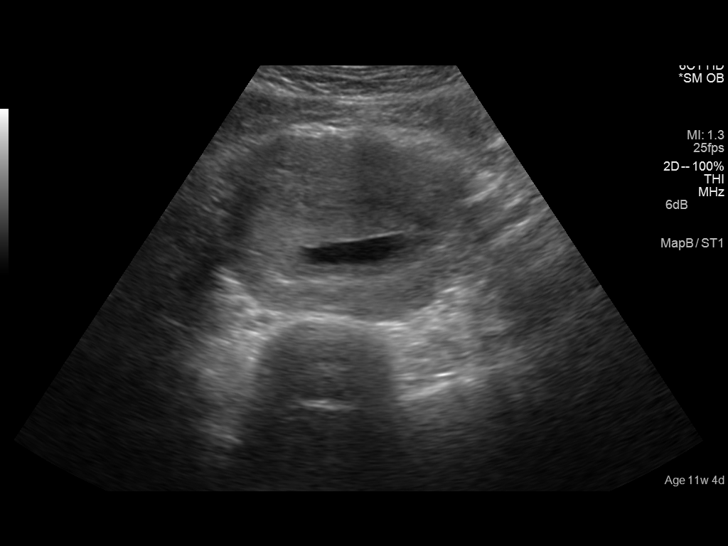
[im 13/31]
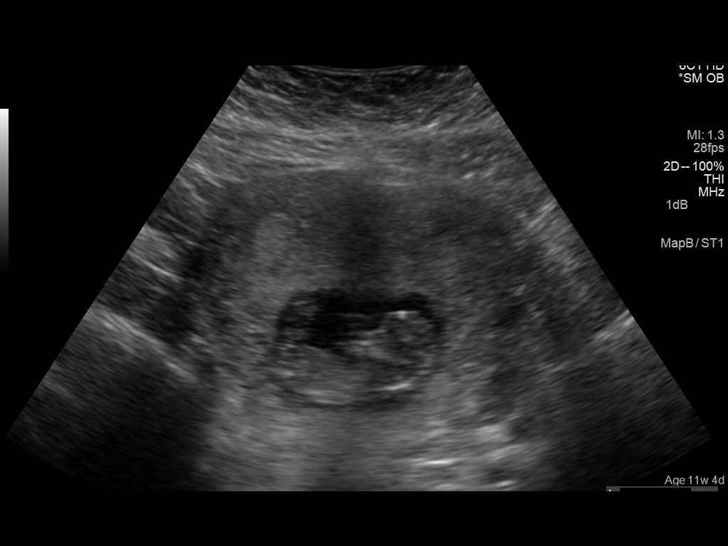
[im 15/31]
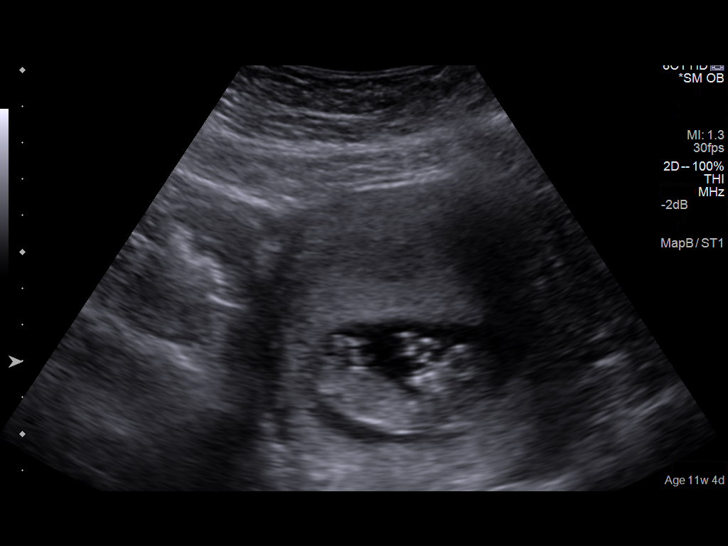
[im 17/31]
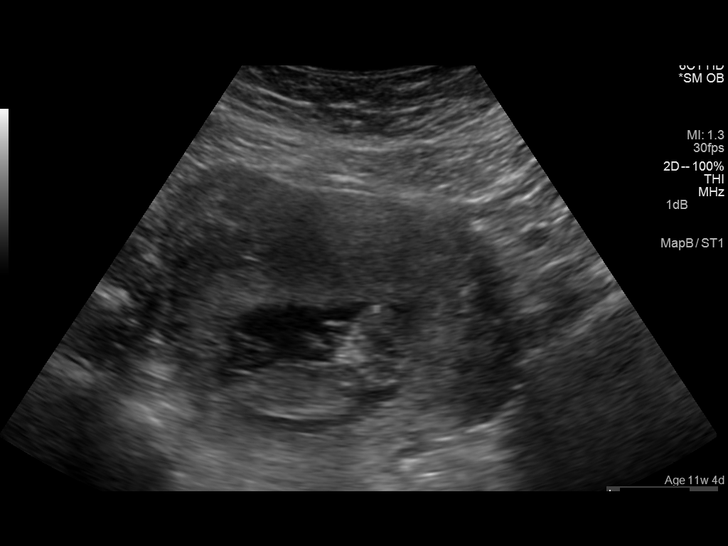
[im 19/31]
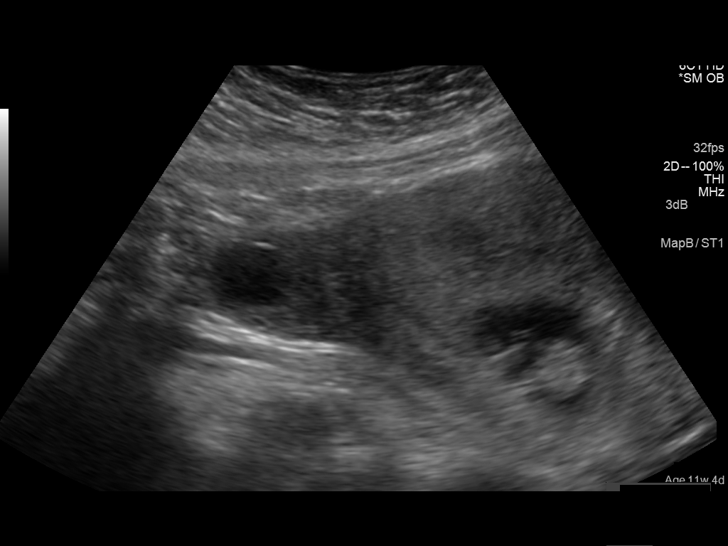
[im 22/31]
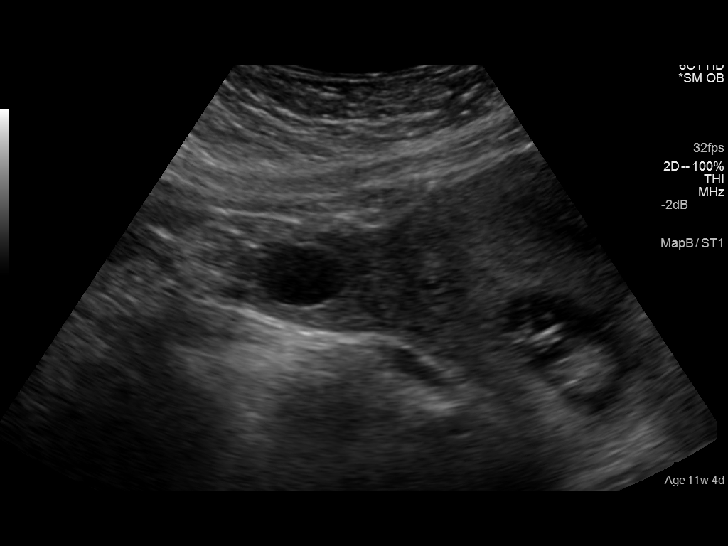
[im 24/31]
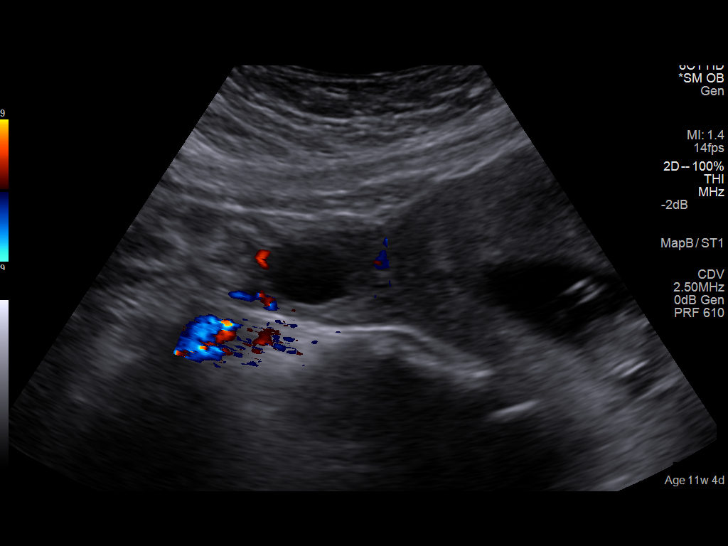
[im 26/31]
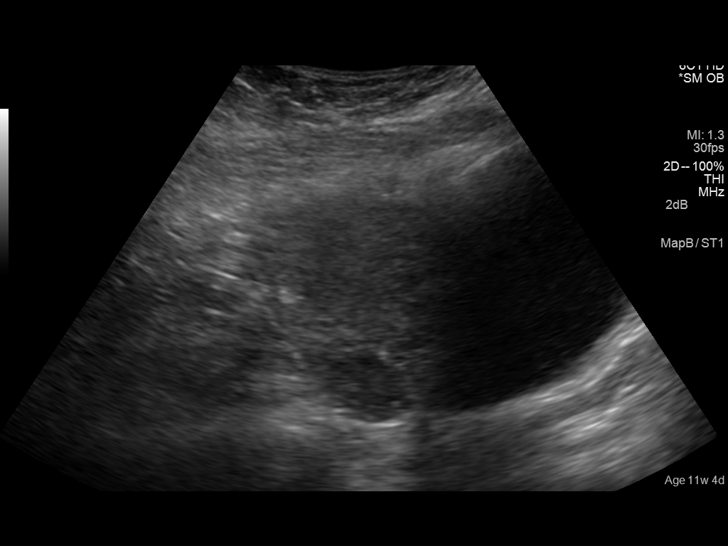
[im 28/31]
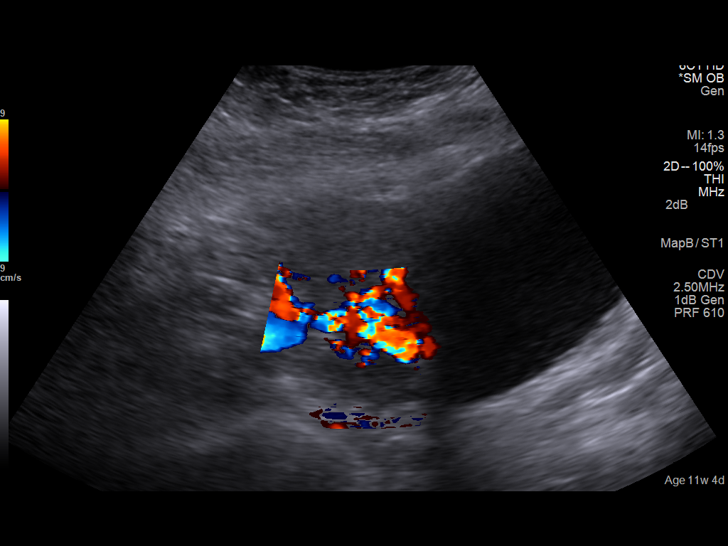
[im 31/31]
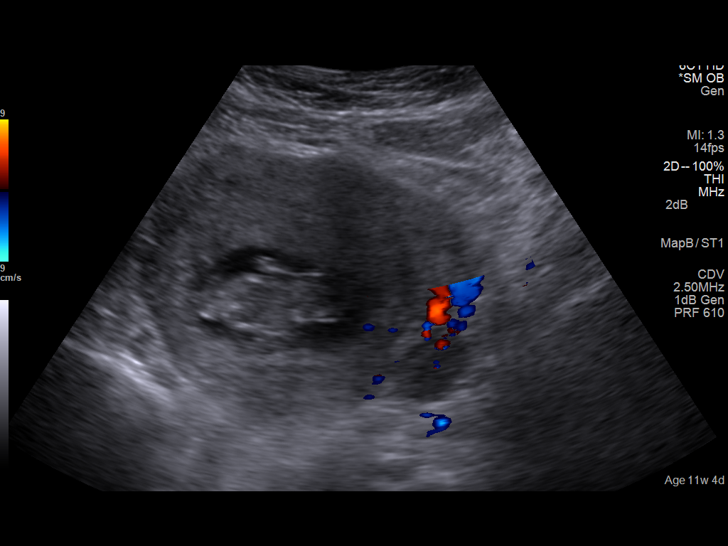

[14 of 28 positions shown; findings below may reference images not displayed]

FINDINGS: Intrauterine gestational sac: Present

Yolk sac:  Not identified

Embryo:  Present

Cardiac Activity: Present

Heart Rate: 150 bpm

CRL:   50  mm   11 w 5 d                  US EDC: [DATE]

Maternal uterus/adnexae: Normal ovaries with corpus luteum cyst in
the right ovary. No free fluid.
IMPRESSION: 1. Single intrauterine gestation with embryo and normal cardiac
activity.

2. Estimated gestational age by crown rump length equals 11 weeks 5
days.

## 2015-09-21 ENCOUNTER — Encounter: Payer: Self-pay | Admitting: Emergency Medicine

## 2015-09-21 DIAGNOSIS — O2342 Unspecified infection of urinary tract in pregnancy, second trimester: Secondary | ICD-10-CM | POA: Insufficient documentation

## 2015-09-21 DIAGNOSIS — Z3A20 20 weeks gestation of pregnancy: Secondary | ICD-10-CM | POA: Insufficient documentation

## 2015-09-21 DIAGNOSIS — R102 Pelvic and perineal pain: Secondary | ICD-10-CM | POA: Diagnosis present

## 2015-09-21 LAB — BASIC METABOLIC PANEL
ANION GAP: 9 (ref 5–15)
BUN: <5 mg/dL — ABNORMAL LOW (ref 6–20)
CHLORIDE: 105 mmol/L (ref 101–111)
CO2: 20 mmol/L — AB (ref 22–32)
Calcium: 8.6 mg/dL — ABNORMAL LOW (ref 8.9–10.3)
Creatinine, Ser: 0.35 mg/dL — ABNORMAL LOW (ref 0.44–1.00)
GFR calc non Af Amer: >60 mL/min (ref >60)
GLUCOSE: 87 mg/dL (ref 65–99)
Potassium: 3.2 mmol/L — ABNORMAL LOW (ref 3.5–5.1)
Sodium: 134 mmol/L — ABNORMAL LOW (ref 135–145)

## 2015-09-21 LAB — URINALYSIS COMPLETE WITH MICROSCOPIC (ARMC ONLY)
Bilirubin Urine: NEGATIVE
Glucose, UA: 50 mg/dL — AB
Ketones, ur: NEGATIVE mg/dL
Nitrite: NEGATIVE
PROTEIN: NEGATIVE mg/dL
SPECIFIC GRAVITY, URINE: 1.014 (ref 1.005–1.030)
pH: 5 (ref 5.0–8.0)

## 2015-09-21 LAB — CBC
HEMATOCRIT: 35.9 % (ref 35.0–47.0)
HEMOGLOBIN: 12.5 g/dL (ref 12.0–16.0)
MCH: 31.2 pg (ref 26.0–34.0)
MCHC: 34.7 g/dL (ref 32.0–36.0)
MCV: 90 fL (ref 80.0–100.0)
Platelets: 245 10*3/uL (ref 150–440)
RBC: 3.99 MIL/uL (ref 3.80–5.20)
RDW: 13.6 % (ref 11.5–14.5)
WBC: 12.5 10*3/uL — ABNORMAL HIGH (ref 3.6–11.0)

## 2015-09-21 LAB — POCT PREGNANCY, URINE: Preg Test, Ur: POSITIVE — AB

## 2015-09-21 LAB — HCG, QUANTITATIVE, PREGNANCY: HCG, BETA CHAIN, QUANT, S: 34420 m[IU]/mL — AB (ref <5)

## 2015-09-21 LAB — ABO/RH: ABO/RH(D): AB POS

## 2015-09-21 NOTE — ED Triage Notes (Signed)
Pt ambulatory to triage with no difficulty. Pt reports for several months noticing she is having pain in her pelvic bone that has at times prevented her from walking. Pt reports feeling weak, having hot flashes, feeling like her heart is racing and having headaches. Pt reports currently feeling like she is burning up and pt is diaphoretic during triage. Pt also noted to be tachycardic at 127. Pt denies shortness of breath at this time but does report feeling that way intermittently. Pt tearful during triage and states she has not been to see a doctor about this because of financial reasons.

## 2015-09-22 ENCOUNTER — Emergency Department
Admission: EM | Admit: 2015-09-22 | Discharge: 2015-09-22 | Disposition: A | Discharge status: Home / Self Care | Payer: Medicaid Other | Attending: Emergency Medicine | Admitting: Emergency Medicine

## 2015-09-22 ENCOUNTER — Emergency Department: Payer: Medicaid Other

## 2015-09-22 DIAGNOSIS — N390 Urinary tract infection, site not specified: Secondary | ICD-10-CM

## 2015-09-22 DIAGNOSIS — Z3490 Encounter for supervision of normal pregnancy, unspecified, unspecified trimester: Secondary | ICD-10-CM

## 2015-09-22 DIAGNOSIS — R102 Pelvic and perineal pain: Secondary | ICD-10-CM

## 2015-09-22 LAB — HEPATIC FUNCTION PANEL
ALK PHOS: 57 U/L (ref 38–126)
ALT: 21 U/L (ref 14–54)
AST: 25 U/L (ref 15–41)
Albumin: 3.3 g/dL — ABNORMAL LOW (ref 3.5–5.0)
Total Protein: 6.9 g/dL (ref 6.5–8.1)

## 2015-09-22 LAB — TSH: TSH: 1.956 u[IU]/mL (ref 0.350–4.500)

## 2015-09-22 MED ORDER — SODIUM CHLORIDE 0.9 % IV BOLUS (SEPSIS)
1000.0000 mL | Freq: Once | INTRAVENOUS | Status: AC
  Administered 2015-09-22: 1000 mL via INTRAVENOUS

## 2015-09-22 MED ORDER — CEPHALEXIN 500 MG PO CAPS: 500.0000 mg | ORAL_CAPSULE | Freq: Three times a day (TID) | ORAL | 0 refills | Status: DC

## 2015-09-22 MED ORDER — HYDROCODONE-ACETAMINOPHEN 5-325 MG PO TABS
1.0000 | ORAL_TABLET | Freq: Once | ORAL | Status: AC
  Administered 2015-09-22: 1 via ORAL
  Filled 2015-09-22: qty 1

## 2015-09-22 MED ORDER — ONDANSETRON 4 MG PO TBDP: 4.0000 mg | ORAL_TABLET | Freq: Three times a day (TID) | ORAL | 0 refills | Status: DC | PRN

## 2015-09-22 MED ORDER — DEXTROSE 5 % IV SOLN
1.0000 g | INTRAVENOUS | Status: DC
  Administered 2015-09-22: 1 g via INTRAVENOUS
  Filled 2015-09-22: qty 10

## 2015-09-22 MED ORDER — HYDROCODONE-ACETAMINOPHEN 5-325 MG PO TABS: 1.0000 | ORAL_TABLET | Freq: Four times a day (QID) | ORAL | 0 refills | Status: DC | PRN

## 2015-09-22 MED ORDER — ACETAMINOPHEN 325 MG PO TABS
650.0000 mg | ORAL_TABLET | Freq: Once | ORAL | Status: AC
  Administered 2015-09-22: 650 mg via ORAL
  Filled 2015-09-22: qty 2

## 2015-09-22 NOTE — Discharge Instructions (Signed)
1. Take antibiotic as prescribed (Keflex 500 mg 3 times daily 7 days). 2. You may take pain and nausea medicines as needed (Norco/Zofran #20). 3. Return to the ER for worsening symptoms, fever, persistent vomiting, vaginal bleeding or other concerns.

## 2015-09-22 NOTE — ED Provider Notes (Signed)
[facility] Emergency Department Provider Note   ____________________________________________   First MD Initiated Contact with Patient 09/22/15 [phone]     (approximate)  I have reviewed the triage vital signs and the nursing notes.   HISTORY  Chief Complaint Weakness; Dizziness; and Pelvic Pain    HPI Sandra Castaneda is a 30 y.o. female who presents to the ED from home with a chief complaint of generalized weakness, having hot flashes, feeling like her heart is racing, headaches and intermittent pelvic discomfort. Patient reports for this past several months she has intermittent pain in her pelvic bone that sometimes is so uncomfortable that it prevents her from walking. She has been feeling generally weak over the past week or so, having "hot flashes", feeling like her breasts are tender, heart racing and occasional mild headaches. Also notes occasional nausea and vomiting. Complains of suprapubic discomfort. Denies associated fever, chest pain, shortness of breath, dysuria, diarrhea.Denies vaginal bleeding. Denies recent travel or trauma. Nothing makes her symptoms better or worse.   Past medical history None  There are no active problems to display for this patient.   No past surgical history on file.  Prior to Admission medications   Not on File    Allergies Review of patient's allergies indicates no known allergies.  No family history on file.  Social History Social History  Substance Use Topics  . Smoking status: Not on file  . Smokeless tobacco: Not on file  . Alcohol use Not on file  Negative for recent alcohol  Review of Systems  Constitutional: Positive for generalized weakness and "hot flashes". No fever/chills. Eyes: No visual changes. ENT: No sore throat. Cardiovascular: Denies chest pain. Respiratory: Denies shortness of breath. Gastrointestinal: Positive for suprapubic abdominal pain.  Positive for occasional nausea  and vomiting.  No diarrhea.  No constipation. Genitourinary: Negative for dysuria. Musculoskeletal: Negative for back pain. Skin: Negative for rash. Neurological: Negative for headaches, focal weakness or numbness.  10-point ROS otherwise negative.  ____________________________________________   PHYSICAL EXAM:  VITAL SIGNS: ED Triage Vitals  Enc Vitals Group     BP 09/21/15 2240 (!) 146/93     Pulse Rate 09/21/15 2240 (!) 127     Resp 09/21/15 2240 18     Temp 09/21/15 2240 98.3 F (36.8 C)     Temp Source 09/21/15 2240 Oral     SpO2 09/21/15 2240 98 %     Weight 09/21/15 2242 250 lb (113.4 kg)     Height 09/21/15 2242 5\' 10"  (1.778 m)     Head Circumference --      Peak Flow --      Pain Score 09/21/15 2242 7     Pain Loc --      Pain Edu? --      Excl. in GC? --     Constitutional: Alert and oriented. Tearful appearing and in no acute distress. Eyes: Conjunctivae are normal. PERRL. EOMI. Head: Atraumatic. Nose: No congestion/rhinnorhea. Mouth/Throat: Mucous membranes are moist.  Oropharynx non-erythematous. Neck: No stridor.   Cardiovascular: Normal rate, regular rhythm. Grossly normal heart sounds.  Good peripheral circulation. Respiratory: Normal respiratory effort.  No retractions. Lungs CTAB. Gastrointestinal: Gravid. Soft and nontender. No distention. No abdominal bruits. No CVA tenderness. Musculoskeletal: No lower extremity tenderness nor edema.  No joint effusions. Neurologic:  Normal speech and language. No gross focal neurologic deficits are appreciated. No gait instability. Skin:  Skin is warm, dry and intact. No rash noted. Psychiatric: Mood  and affect are normal. Speech and behavior are normal.  ____________________________________________   LABS (all labs ordered are listed, but only abnormal results are displayed)  Labs Reviewed  BASIC METABOLIC PANEL - Abnormal; Notable for the following:       Result Value   Sodium 134 (*)    Potassium 3.2  (*)    CO2 20 (*)    BUN <5 (*)    Creatinine, Ser 0.35 (*)    Calcium 8.6 (*)    All other components within normal limits  CBC - Abnormal; Notable for the following:    WBC 12.5 (*)    All other components within normal limits  URINALYSIS COMPLETEWITH MICROSCOPIC ([facility] ONLY) - Abnormal; Notable for the following:    Color, Urine YELLOW (*)    APPearance HAZY (*)    Glucose, UA 50 (*)    Hgb urine dipstick 1+ (*)    Leukocytes, UA 3+ (*)    Bacteria, UA RARE (*)    Squamous Epithelial / LPF 0-5 (*)    All other components within normal limits  HCG, QUANTITATIVE, PREGNANCY - Abnormal; Notable for the following:    hCG, Beta Chain, Quant, S 34,420 (*)    All other components within normal limits  HEPATIC FUNCTION PANEL - Abnormal; Notable for the following:    Albumin 3.3 (*)    Total Bilirubin <0.1 (*)    Bilirubin, Direct <0.1 (*)    All other components within normal limits  POCT PREGNANCY, URINE - Abnormal; Notable for the following:    Preg Test, Ur POSITIVE (*)    All other components within normal limits  URINE CULTURE  TSH  POC URINE PREG, ED  CBG MONITORING, ED  ABO/RH   ____________________________________________  EKG  None ____________________________________________  RADIOLOGY  OB ultrasound interpreted per Dr. Grace IsaacWatts: 1. Single living intrauterine pregnancy measuring 20 weeks 1 day. No  pathologic finding.  2. This exam is performed on an emergent basis and does not  comprehensively evaluate fetal size, dating, or anatomy; follow-up  complete OB US should be considered if further fetal assessment is  warranted.    ____________________________________________   PROCEDURES  Procedure(s) performed: None  Procedures  Critical Care performed: No  ____________________________________________   INITIAL IMPRESSION / ASSESSMENT AND PLAN / ED COURSE  Pertinent labs & imaging results that were available during my care of the patient were reviewed  by me and considered in my medical decision making (see chart for details).  30 year old female who presents with generalized weakness, "hot flashes", tender breasts and occasional nausea/vomiting who is found to have a 20 week pregnancy. This will be G3 P1 Ab1. Patient is tearful because this is a surprise to her. IV Rocephin administered for UTI. Heart rate has normalized. Noted mildly elevated blood pressure without laboratory evidence for HELLP syndrome. Feel this is more likely related to her tearfulness and discomfort. Will administer Tylenol, obtain fetal heart tones and reassess.  Clinical Course  Comment By Time  Updated patient of hepatic panel and TSH results. She is overall feeling better and less tearful. Will continue patient on this for UTI. [facility]. Strict return precautions given. Patient verbalizes understanding and agrees with plan of care. Irean HongJade J Westlynn Fifer, MD 08/20 0400     ____________________________________________   FINAL CLINICAL IMPRESSION(S) / ED DIAGNOSES  Final diagnoses:  UTI (lower urinary tract infection)  Pregnancy      NEW MEDICATIONS STARTED DURING  THIS VISIT:  New Prescriptions   No medications on file     Note:  This document was prepared using Dragon voice recognition software and may include unintentional dictation errors.    Irean HongJade J Herminio Kniskern, MD 09/22/15 [phone]

## 2015-09-23 LAB — URINE CULTURE
Culture: NO GROWTH
SPECIAL REQUESTS: NORMAL

## 2015-10-01 ENCOUNTER — Other Ambulatory Visit: Payer: Self-pay | Admitting: Advanced Practice Midwife

## 2015-10-01 DIAGNOSIS — Z3482 Encounter for supervision of other normal pregnancy, second trimester: Secondary | ICD-10-CM

## 2015-10-02 LAB — OB RESULTS CONSOLE GC/CHLAMYDIA
Chlamydia: NEGATIVE
Gonorrhea: NEGATIVE

## 2015-10-04 ENCOUNTER — Ambulatory Visit
Admission: RE | Admit: 2015-10-04 | Discharge: 2015-10-04 | Disposition: A | Discharge status: Home / Self Care | Payer: Medicaid Other | Source: Ambulatory Visit | Attending: Advanced Practice Midwife | Admitting: Advanced Practice Midwife

## 2015-10-04 DIAGNOSIS — O321XX0 Maternal care for breech presentation, not applicable or unspecified: Secondary | ICD-10-CM | POA: Diagnosis not present

## 2015-10-04 DIAGNOSIS — Z3A21 21 weeks gestation of pregnancy: Secondary | ICD-10-CM | POA: Diagnosis not present

## 2015-10-04 DIAGNOSIS — Z3492 Encounter for supervision of normal pregnancy, unspecified, second trimester: Secondary | ICD-10-CM | POA: Diagnosis present

## 2015-10-04 DIAGNOSIS — Z3482 Encounter for supervision of other normal pregnancy, second trimester: Secondary | ICD-10-CM

## 2015-11-12 LAB — OB RESULTS CONSOLE HIV ANTIBODY (ROUTINE TESTING): HIV: NONREACTIVE

## 2015-11-12 LAB — OB RESULTS CONSOLE HEPATITIS B SURFACE ANTIGEN: HEP B S AG: NEGATIVE

## 2015-11-13 LAB — OB RESULTS CONSOLE RPR: RPR: NONREACTIVE

## 2015-11-18 IMAGING — US US OB LIMITED
1 series · 14 of 28 positions shown · non-contrast
Comparison: none

CLINICAL DATA: Pelvic pain in second trimester pregnancy. Unsure
dating.

EXAM:
LIMITED OBSTETRIC ULTRASOUND

[Series 1: us ob limited · 0.26mm/px · 34 acquisitions, 14 frames shown]
[im 2/34]
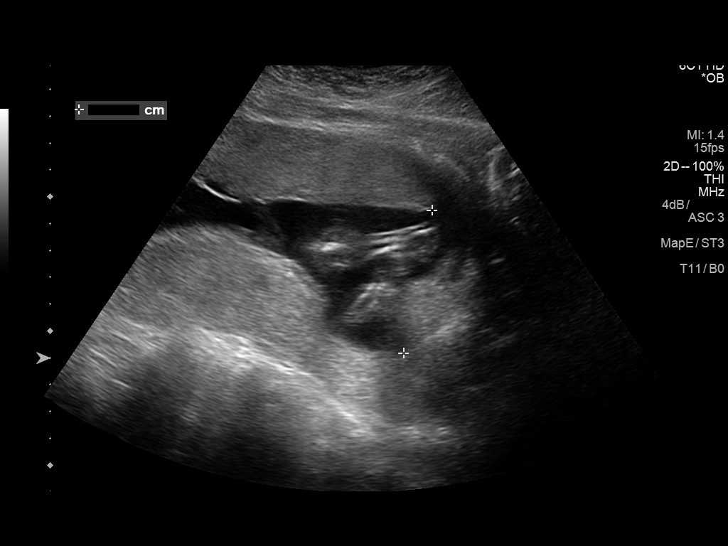
[im 4/34]
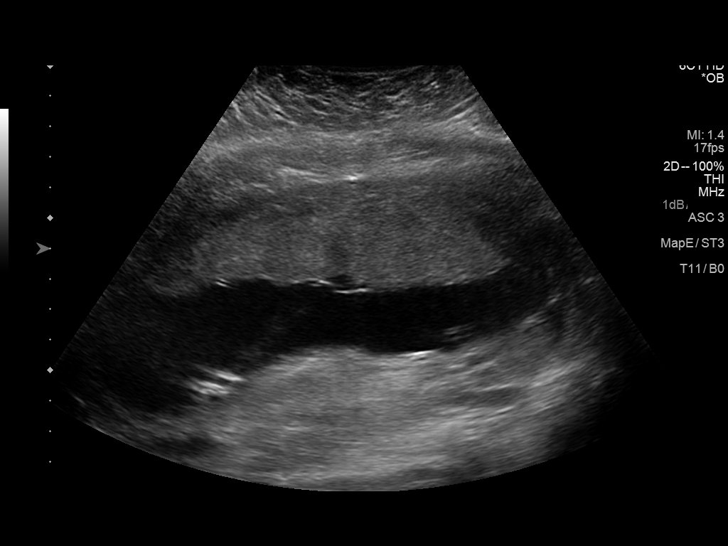
[im 7/34]
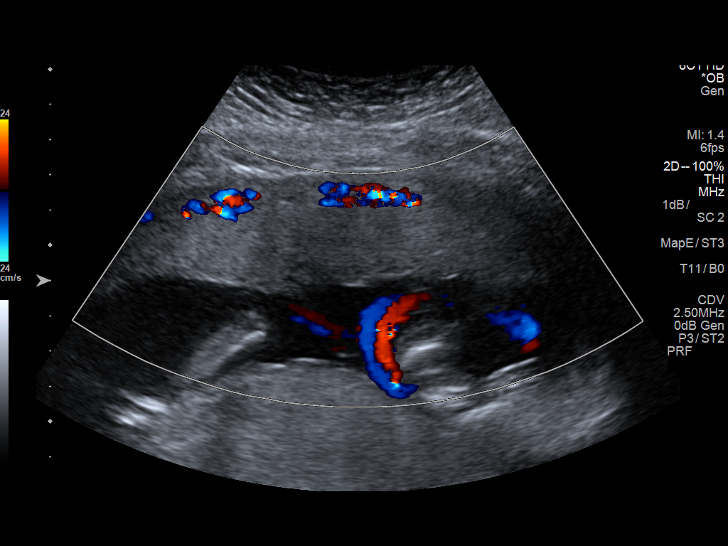
[im 9/34]
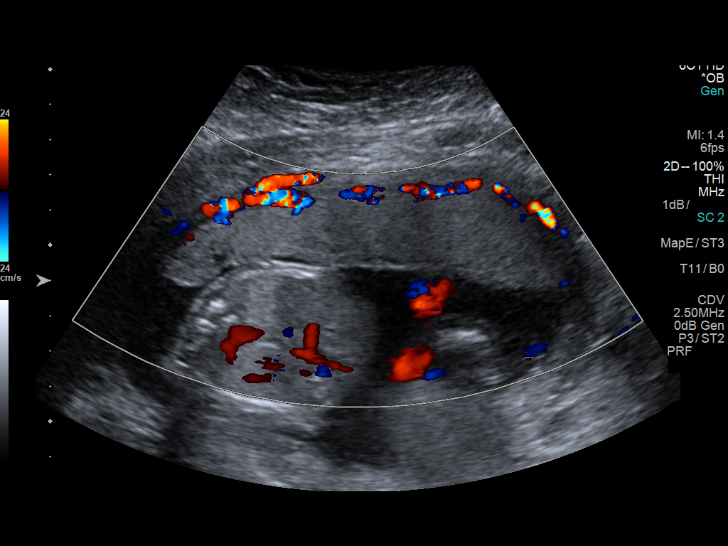
[im 12/34]
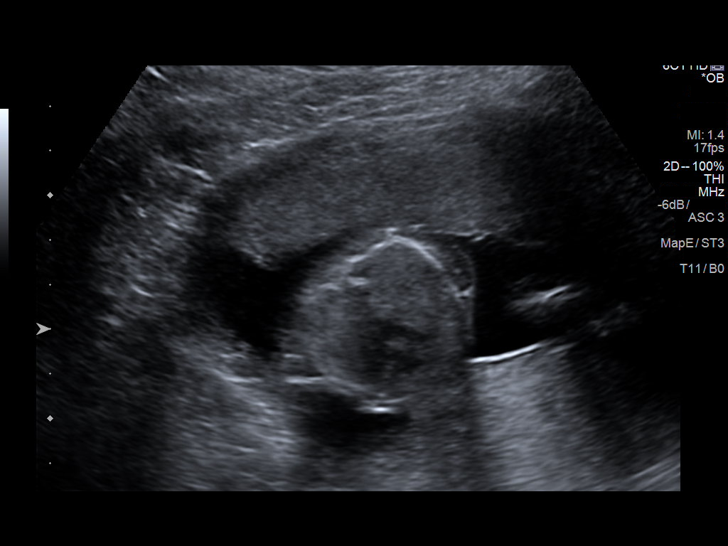
[im 14/34]
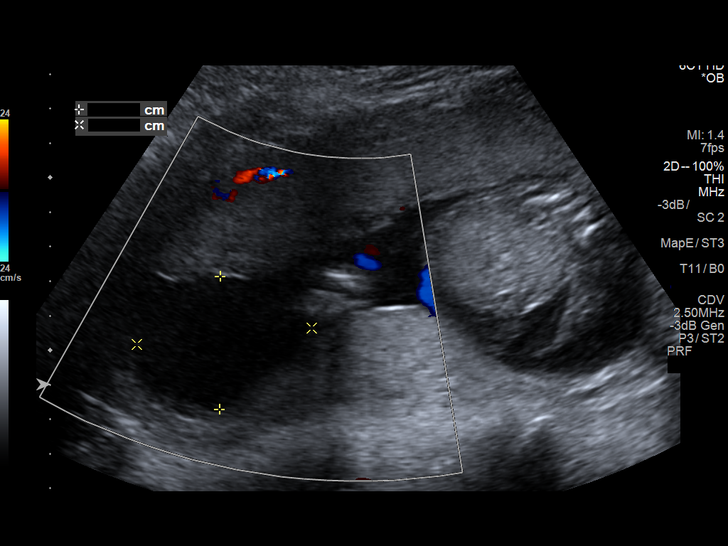
[im 16/34]
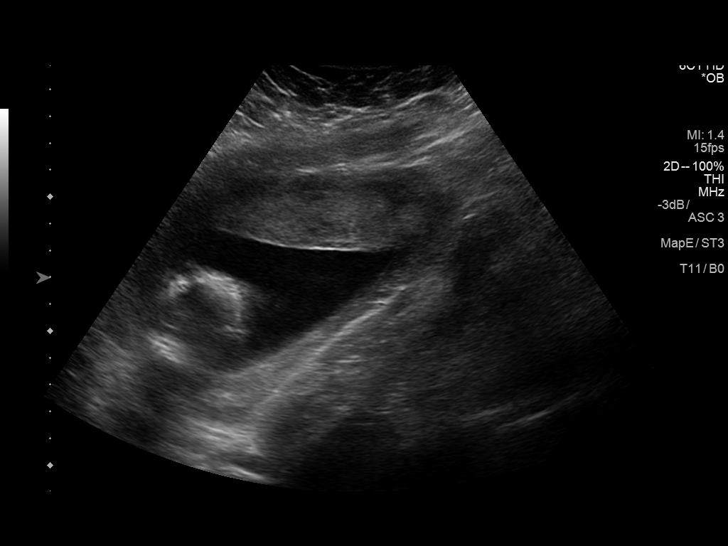
[im 19/34]
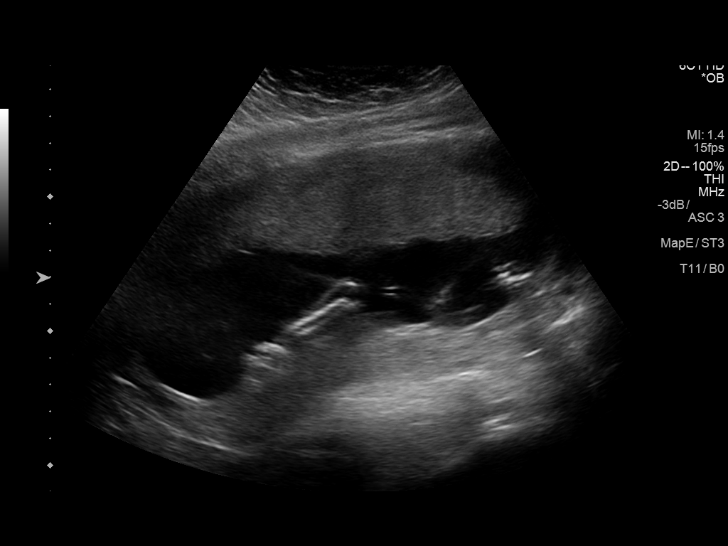
[im 21/34]
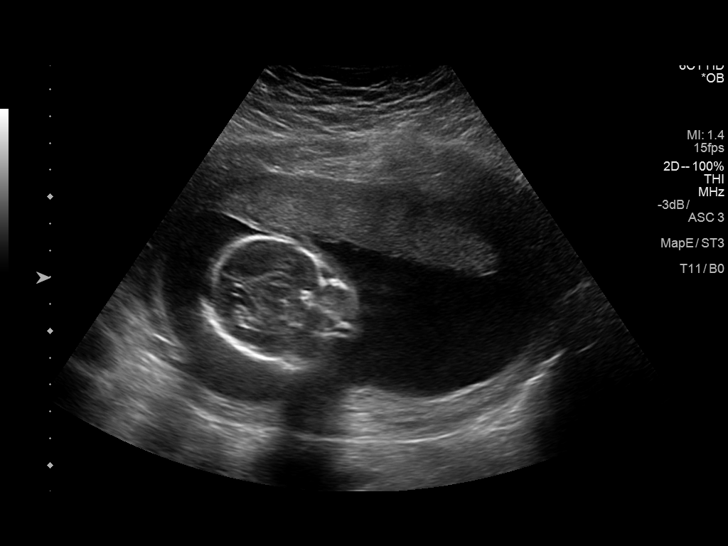
[im 24/34]
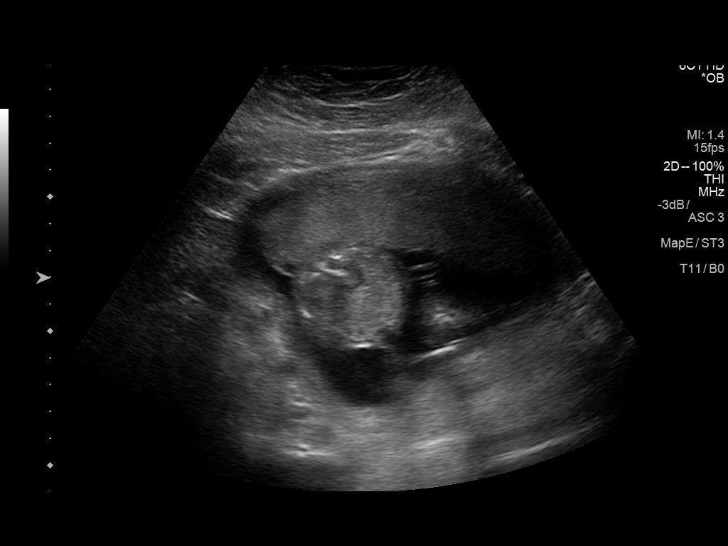
[im 26/34]
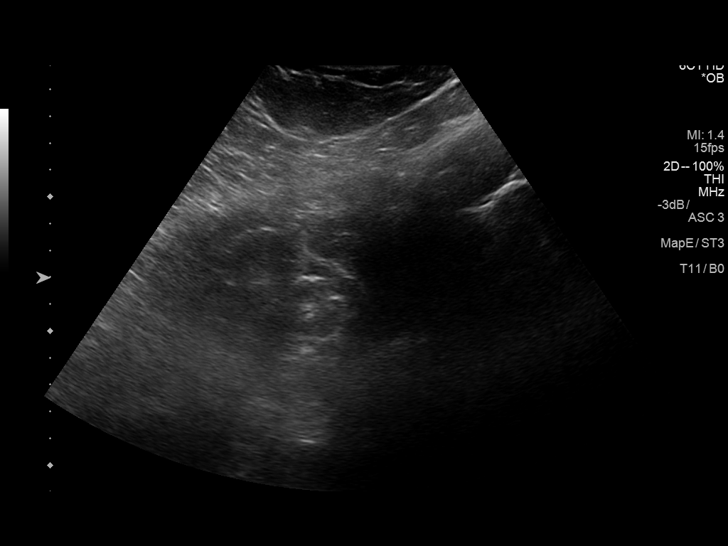
[im 29/34]
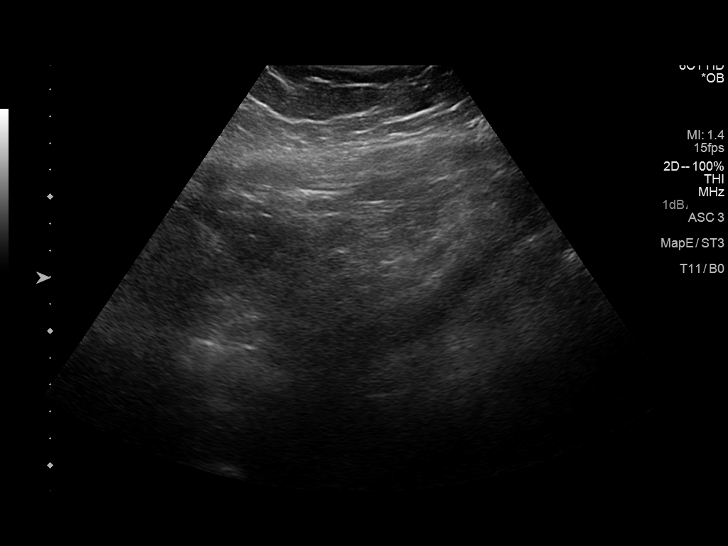
[im 31/34]
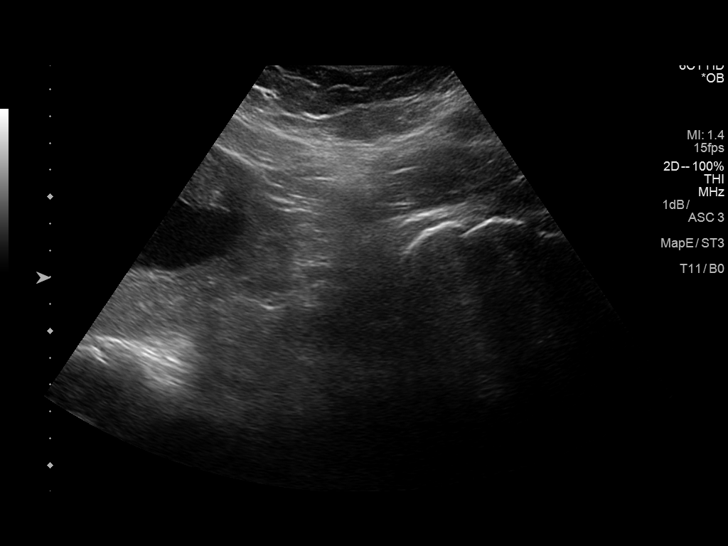
[im 34/34]
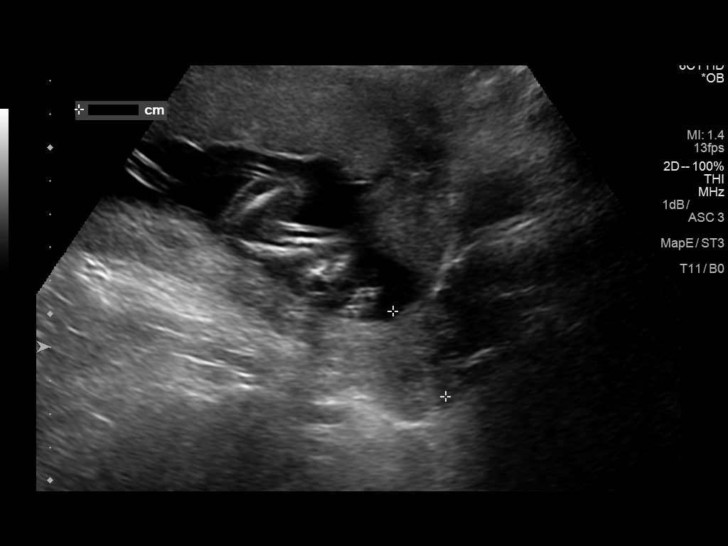

[14 of 28 positions shown; findings below may reference images not displayed]

FINDINGS: Number of Fetuses:  1

Heart Rate:  125 bpm

Movement:  Yes per sonographer exam

Presentation: Variable

Placental Location: Anterior. No evidence of subplacental
collection.

Previa: None.

Amniotic Fluid (Subjective):  Within normal limits.

BPD:  3.29cm 20w  1d

MATERNAL FINDINGS:

Cervix:  Appears closed.

Uterus/Adnexae:  No abnormality visualized.
IMPRESSION: 1. Single living intrauterine pregnancy measuring 20 weeks 1 day. No
pathologic finding.
2. This exam is performed on an emergent basis and does not
comprehensively evaluate fetal size, dating, or anatomy; follow-up
complete OB US should be considered if further fetal assessment is
warranted.

## 2015-11-30 IMAGING — US US OB COMP +14 WK
1 series · 14 of 28 positions shown · non-contrast
Comparison: none

CLINICAL DATA: Pregnancy.

EXAM:
OBSTETRICAL ULTRASOUND >14 WKS

[Series 1: us ob comp +14 wk · 0.25mm/px · 14 of 108 slices shown]
[im 4/108]
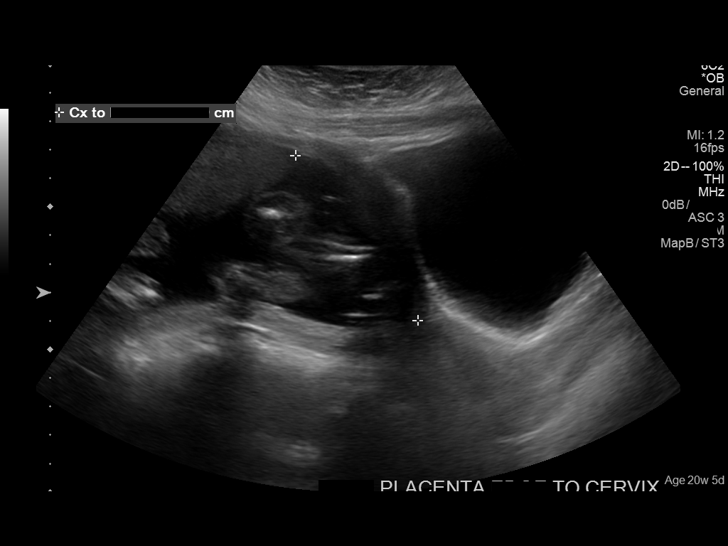
[im 12/108]
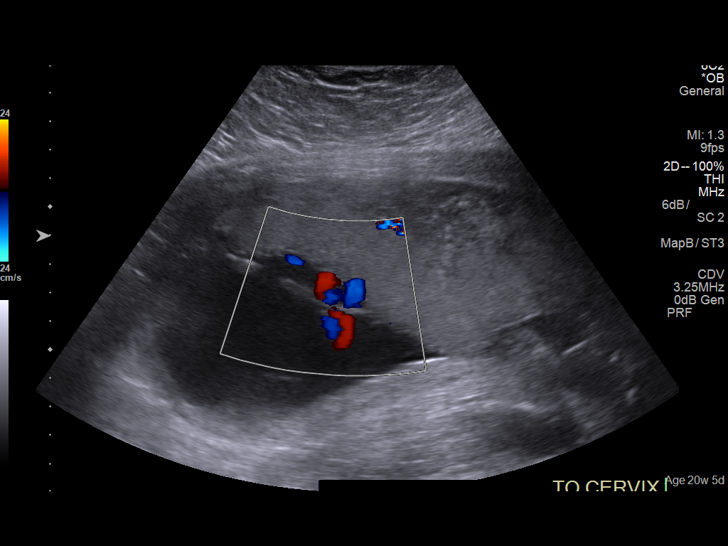
[im 20/108]
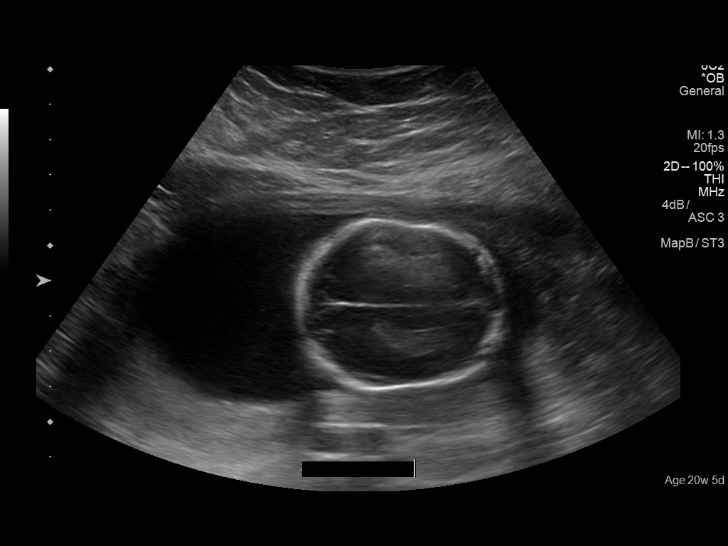
[im 28/108]
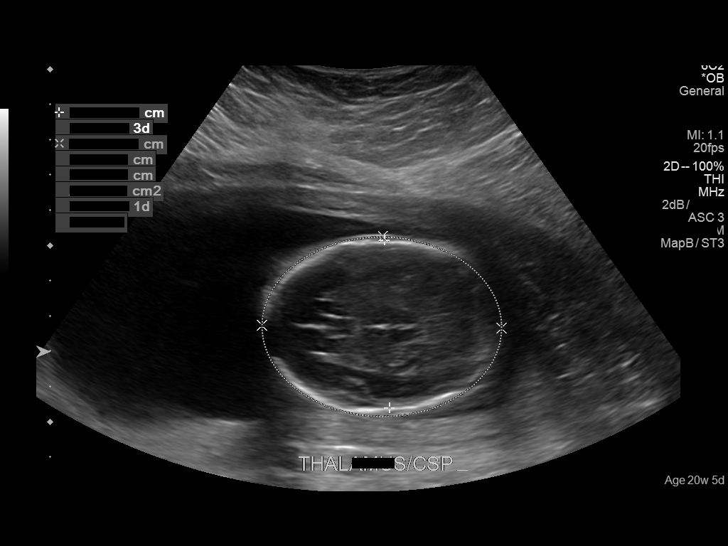
[im 36/108]
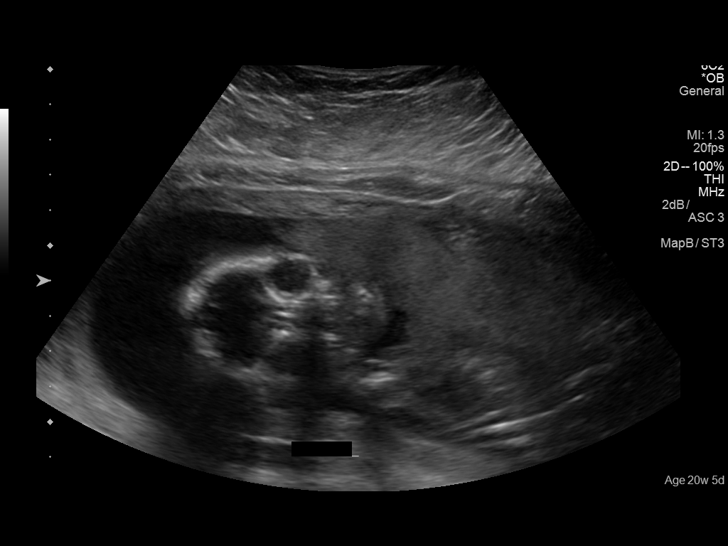
[im 44/108]
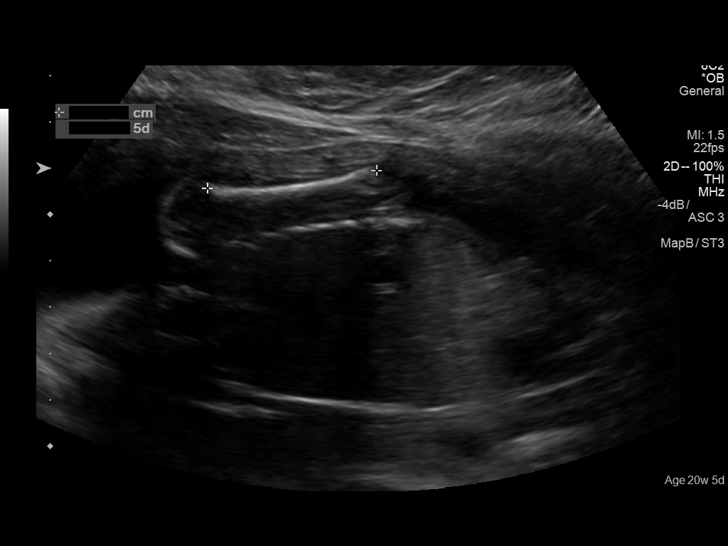
[im 52/108]
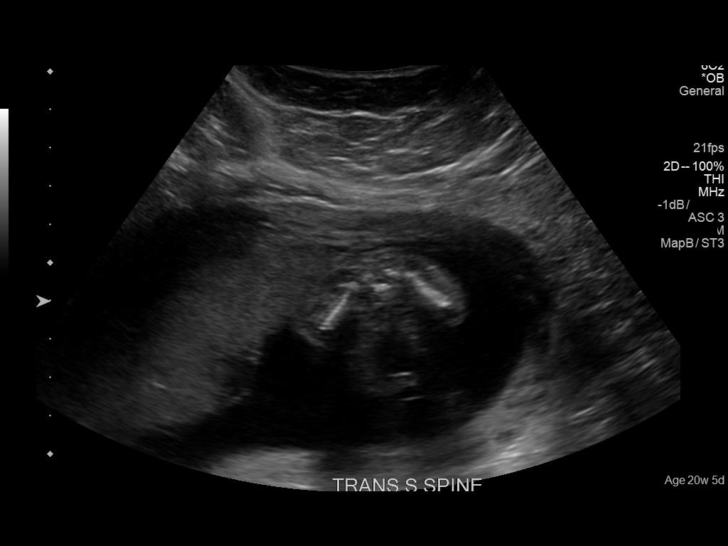
[im 60/108]
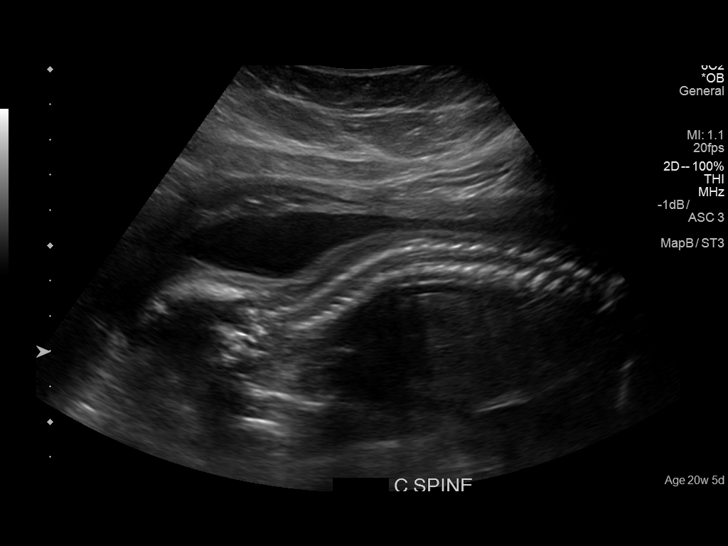
[im 68/108]
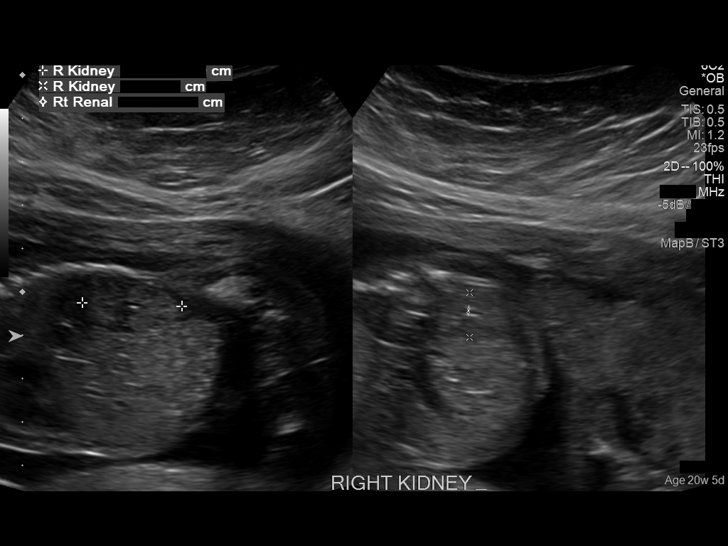
[im 76/108]
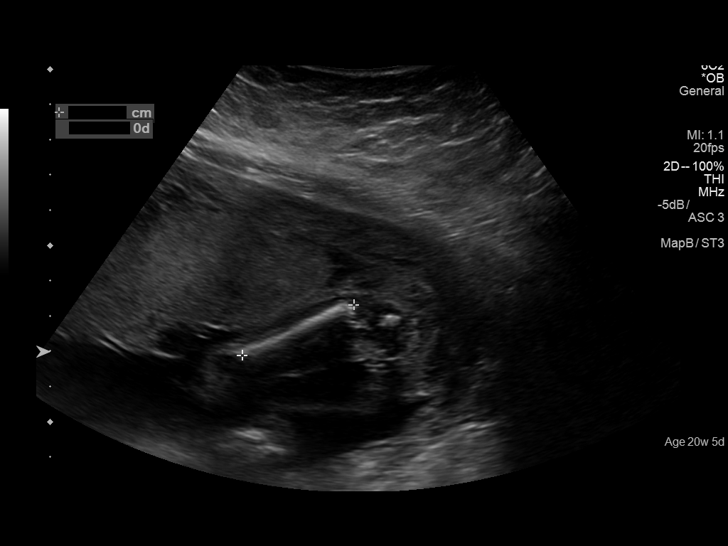
[im 84/108]
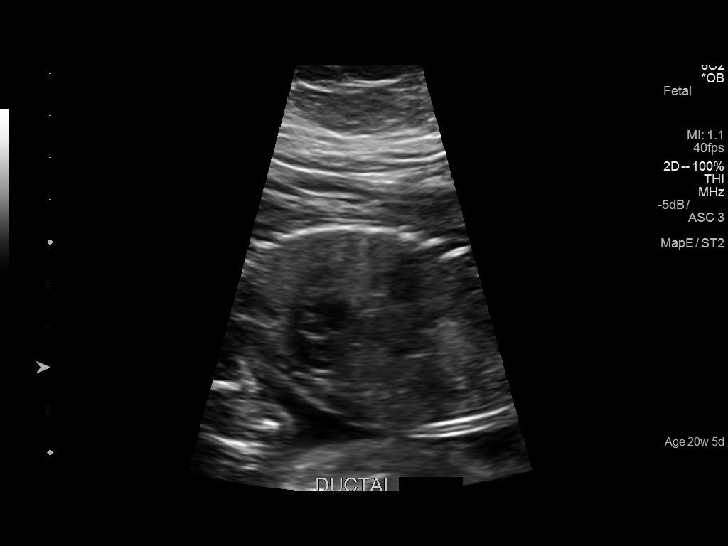
[im 92/108]
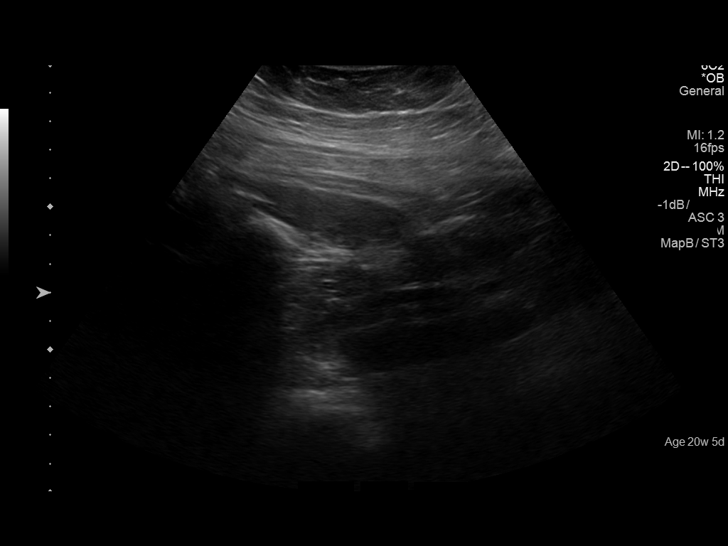
[im 100/108]
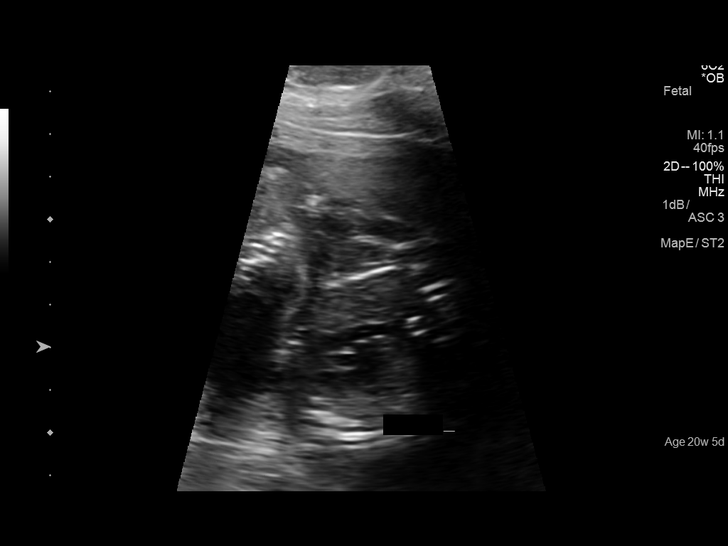
[im 108/108]
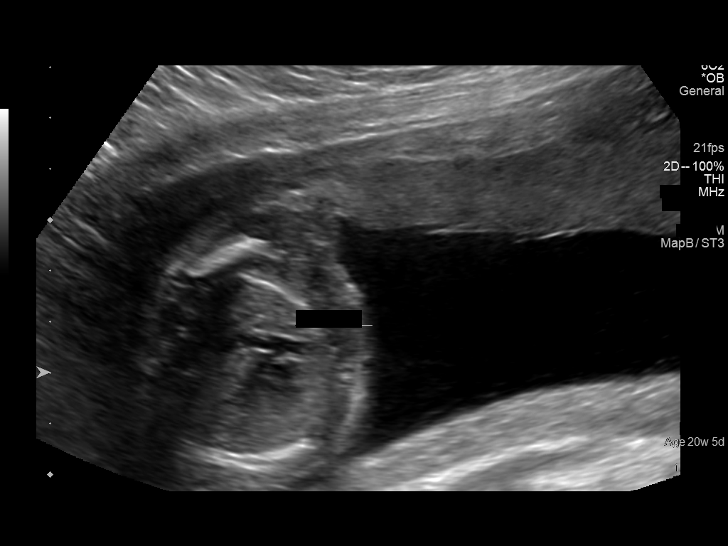

[14 of 28 positions shown; findings below may reference images not displayed]

FINDINGS: Number of Fetuses: 1

Heart Rate:  147 bpm

Movement: Present

Presentation: Breech

Previa: None

Placental Location: Anterior

Amniotic Fluid (Subjective): Normal

Amniotic Fluid (Objective):

Vertical pocket of 5.4cm

FETAL BIOMETRY

BPD:  4.8cm 20w 3d

HC:    18.5cm  20w   6d

AC:   16.9cm  21w   6d

FL:   3.5cm  21w   0d

Current Mean GA: 21w 3d              US EDC: [DATE]

FETAL ANATOMY

Lateral Ventricles: Visualized

Thalami/CSP: Visualized

Posterior Fossa:  Visualized

Nuchal Region: Visualized

Upper Lip: Visualized

Spine: Visualized

4 Chamber Heart on Left: Visualized

LVOT: Visualized

RVOT: Visualized

Stomach on Left: Visualized

3 Vessel Cord: Visualized

Cord Insertion site: Visualized

Kidneys: Visualized

Bladder: Visualized

Extremities: Visualized

Maternal Findings:

Cervix:  4.4 cm and closed.
IMPRESSION: Single viable intrauterine pregnancy in breech presentation at 21
weeks 3 days.

## 2015-12-20 LAB — OB RESULTS CONSOLE RUBELLA ANTIBODY, IGM: RUBELLA: IMMUNE

## 2015-12-20 LAB — OB RESULTS CONSOLE VARICELLA ZOSTER ANTIBODY, IGG: VARICELLA IGG: IMMUNE

## 2016-01-31 ENCOUNTER — Encounter
Admission: RE | Admit: 2016-01-31 | Discharge: 2016-01-31 | Disposition: A | Discharge status: Home / Self Care | Payer: Medicaid Other | Source: Ambulatory Visit | Attending: Obstetrics and Gynecology | Admitting: Obstetrics and Gynecology

## 2016-01-31 DIAGNOSIS — Z3A39 39 weeks gestation of pregnancy: Secondary | ICD-10-CM | POA: Diagnosis not present

## 2016-01-31 DIAGNOSIS — O09893 Supervision of other high risk pregnancies, third trimester: Secondary | ICD-10-CM | POA: Insufficient documentation

## 2016-01-31 DIAGNOSIS — Z01812 Encounter for preprocedural laboratory examination: Secondary | ICD-10-CM | POA: Insufficient documentation

## 2016-01-31 HISTORY — DX: Unspecified ovarian cyst, unspecified side: N83.209

## 2016-01-31 LAB — RAPID HIV SCREEN (HIV 1/2 AB+AG)
HIV 1/2 ANTIBODIES: NONREACTIVE
HIV-1 P24 ANTIGEN - HIV24: NONREACTIVE

## 2016-01-31 LAB — TYPE AND SCREEN
ABO/RH(D): AB POS
ANTIBODY SCREEN: NEGATIVE
Extend sample reason: UNDETERMINED

## 2016-01-31 LAB — CBC
HCT: 33.7 % — ABNORMAL LOW (ref 35.0–47.0)
HEMOGLOBIN: 11.5 g/dL — AB (ref 12.0–16.0)
MCH: 30.6 pg (ref 26.0–34.0)
MCHC: 34.3 g/dL (ref 32.0–36.0)
MCV: 89.4 fL (ref 80.0–100.0)
Platelets: 268 10*3/uL (ref 150–440)
RBC: 3.77 MIL/uL — AB (ref 3.80–5.20)
RDW: 13.8 % (ref 11.5–14.5)
WBC: 12.1 10*3/uL — AB (ref 3.6–11.0)

## 2016-01-31 NOTE — Patient Instructions (Signed)
  Your procedure is scheduled ZO:XWRUEAVon:Tuesday Jan. 2 , 2018. Report to ER at 5:30 am.  Remember: Instructions that are not followed completely may result in serious medical risk, up to and including death, or upon the discretion of your surgeon and anesthesiologist your surgery may need to be rescheduled.    _x___ 1. Do not eat food or drink liquids after midnight. No gum chewing or hard candies.     ____ 2. No Alcohol for 24 hours before or after surgery.   ____ 3. Bring all medications with you on the day of surgery if instructed.    __x__ 4. Notify your doctor if there is any change in your medical condition     (cold, fever, infections).    __x___ 5. No smoking 24 hours prior to surgery.     Do not wear jewelry, make-up, hairpins, clips or nail polish.  Do not wear lotions, powders, or perfumes.   Do not shave 48 hours prior to surgery. Men may shave face and neck.  Do not bring valuables to the hospital.    [facility] is not responsible for any belongings or valuables.               Contacts, dentures or bridgework may not be worn into surgery.  Leave your suitcase in the car. After surgery it may be brought to your room.  For patients admitted to the hospital, discharge time is determined by your treatment team.   Patients discharged the day of surgery will not be allowed to drive home.    Please read over the following fact sheets that you were given:   [facility] Pre[facility]  ____ Take these medicines the morning of surgery with A SIP OF WATER: None    ____ Fleet Enema (as directed)   _x___ Use CHG Soap as directed on instruction sheet  ____ Use inhalers on the day of surgery and bring to hospital day of surgery  ____ Stop metformin 2 days prior to surgery    ____ Take 1/2 of usual insulin dose the night before surgery and none on the morning of surgery.   ____ Stop Coumadin/Plavix/aspirin on Does not apply.  _x__ Stop Anti-inflammatories such as Advil,  Aleve, Ibuprofen, Motrin, Naproxen,  Naprosyn, Goodies powders or aspirin products. OK to take Tylenol.   ____ Stop supplements until after surgery.    ____ Bring C-Pap to the hospital.

## 2016-02-01 LAB — RPR: RPR: NONREACTIVE

## 2016-02-04 ENCOUNTER — Encounter
Admission: RE | Disposition: A | Discharge status: Home / Self Care | Payer: Self-pay | Source: Ambulatory Visit | Attending: Obstetrics and Gynecology

## 2016-02-04 ENCOUNTER — Inpatient Hospital Stay: Payer: Medicaid Other | Admitting: Anesthesiology

## 2016-02-04 ENCOUNTER — Inpatient Hospital Stay
Admission: RE | Admit: 2016-02-04 | Discharge: 2016-02-07 | DRG: 765 | Disposition: A | Discharge status: Home / Self Care | Payer: Medicaid Other | Source: Ambulatory Visit | Attending: Obstetrics and Gynecology | Admitting: Obstetrics and Gynecology

## 2016-02-04 DIAGNOSIS — O9962 Diseases of the digestive system complicating childbirth: Secondary | ICD-10-CM | POA: Diagnosis present

## 2016-02-04 DIAGNOSIS — Z3A39 39 weeks gestation of pregnancy: Secondary | ICD-10-CM | POA: Diagnosis not present

## 2016-02-04 DIAGNOSIS — Z302 Encounter for sterilization: Secondary | ICD-10-CM

## 2016-02-04 DIAGNOSIS — Z98891 History of uterine scar from previous surgery: Secondary | ICD-10-CM

## 2016-02-04 DIAGNOSIS — O0993 Supervision of high risk pregnancy, unspecified, third trimester: Secondary | ICD-10-CM

## 2016-02-04 DIAGNOSIS — O34211 Maternal care for low transverse scar from previous cesarean delivery: Principal | ICD-10-CM | POA: Diagnosis present

## 2016-02-04 DIAGNOSIS — Z0000 Encounter for general adult medical examination without abnormal findings: Secondary | ICD-10-CM

## 2016-02-04 DIAGNOSIS — O34219 Maternal care for unspecified type scar from previous cesarean delivery: Secondary | ICD-10-CM | POA: Diagnosis present

## 2016-02-04 DIAGNOSIS — O9081 Anemia of the puerperium: Secondary | ICD-10-CM | POA: Diagnosis not present

## 2016-02-04 DIAGNOSIS — D62 Acute posthemorrhagic anemia: Secondary | ICD-10-CM | POA: Diagnosis not present

## 2016-02-04 DIAGNOSIS — K219 Gastro-esophageal reflux disease without esophagitis: Secondary | ICD-10-CM | POA: Diagnosis present

## 2016-02-04 DIAGNOSIS — O99213 Obesity complicating pregnancy, third trimester: Secondary | ICD-10-CM | POA: Diagnosis present

## 2016-02-04 LAB — CBC WITH DIFFERENTIAL/PLATELET
BASOS ABS: 0.1 10*3/uL (ref 0–0.1)
Basophils Relative: 1 %
Eosinophils Absolute: 0.1 10*3/uL (ref 0–0.7)
Eosinophils Relative: 1 %
HEMATOCRIT: 33.8 % — AB (ref 35.0–47.0)
Hemoglobin: 11.6 g/dL — ABNORMAL LOW (ref 12.0–16.0)
LYMPHS ABS: 3.5 10*3/uL (ref 1.0–3.6)
LYMPHS PCT: 26 %
MCH: 30.6 pg (ref 26.0–34.0)
MCHC: 34.3 g/dL (ref 32.0–36.0)
MCV: 89 fL (ref 80.0–100.0)
Monocytes Absolute: 1.3 10*3/uL — ABNORMAL HIGH (ref 0.2–0.9)
Monocytes Relative: 9 %
NEUTROS ABS: 8.8 10*3/uL — AB (ref 1.4–6.5)
Neutrophils Relative %: 63 %
Platelets: 277 10*3/uL (ref 150–440)
RBC: 3.8 MIL/uL (ref 3.80–5.20)
RDW: 13.6 % (ref 11.5–14.5)
WBC: 13.8 10*3/uL — AB (ref 3.6–11.0)

## 2016-02-04 LAB — TYPE AND SCREEN
ABO/RH(D): AB POS
Antibody Screen: NEGATIVE

## 2016-02-04 SURGERY — Surgical Case
Anesthesia: Spinal

## 2016-02-04 MED ORDER — BUPIVACAINE HCL (PF) 0.5 % IJ SOLN
5.0000 mL | Freq: Once | INTRAMUSCULAR | Status: DC
  Filled 2016-02-04: qty 30

## 2016-02-04 MED ORDER — NALBUPHINE HCL 10 MG/ML IJ SOLN: 5.0000 mg | INTRAMUSCULAR | Status: DC | PRN

## 2016-02-04 MED ORDER — EPHEDRINE SULFATE-NACL 50-0.9 MG/10ML-% IV SOSY
PREFILLED_SYRINGE | INTRAVENOUS | Status: DC | PRN
  Administered 2016-02-04: 10 mg via INTRAVENOUS

## 2016-02-04 MED ORDER — NALBUPHINE HCL 10 MG/ML IJ SOLN: 5.0000 mg | Freq: Once | INTRAMUSCULAR | Status: DC | PRN

## 2016-02-04 MED ORDER — IBUPROFEN 600 MG PO TABS: 600.0000 mg | ORAL_TABLET | Freq: Four times a day (QID) | ORAL | Status: DC

## 2016-02-04 MED ORDER — OXYTOCIN 10 UNIT/ML IJ SOLN
INTRAMUSCULAR | Status: AC
  Filled 2016-02-04: qty 1

## 2016-02-04 MED ORDER — KETOROLAC TROMETHAMINE 30 MG/ML IJ SOLN: 30.0000 mg | Freq: Four times a day (QID) | INTRAMUSCULAR | Status: DC | PRN

## 2016-02-04 MED ORDER — MENTHOL 3 MG MT LOZG
1.0000 | LOZENGE | OROMUCOSAL | Status: DC | PRN
  Filled 2016-02-04: qty 9

## 2016-02-04 MED ORDER — PHENYLEPHRINE 40 MCG/ML (10ML) SYRINGE FOR IV PUSH (FOR BLOOD PRESSURE SUPPORT)
PREFILLED_SYRINGE | INTRAVENOUS | Status: AC
  Filled 2016-02-04: qty 10

## 2016-02-04 MED ORDER — DIPHENHYDRAMINE HCL 25 MG PO CAPS: 25.0000 mg | ORAL_CAPSULE | Freq: Four times a day (QID) | ORAL | Status: DC | PRN

## 2016-02-04 MED ORDER — OXYCODONE-ACETAMINOPHEN 5-325 MG PO TABS
2.0000 | ORAL_TABLET | ORAL | Status: DC | PRN
  Administered 2016-02-05 – 2016-02-07 (×12): 2 via ORAL
  Filled 2016-02-04 (×12): qty 2

## 2016-02-04 MED ORDER — WITCH HAZEL-GLYCERIN EX PADS: 1.0000 "application " | MEDICATED_PAD | CUTANEOUS | Status: DC | PRN

## 2016-02-04 MED ORDER — FENTANYL CITRATE (PF) 100 MCG/2ML IJ SOLN
INTRAMUSCULAR | Status: DC | PRN
  Administered 2016-02-04: 15 ug via INTRAVENOUS

## 2016-02-04 MED ORDER — FENTANYL CITRATE (PF) 100 MCG/2ML IJ SOLN
INTRAMUSCULAR | Status: AC
  Filled 2016-02-04: qty 2

## 2016-02-04 MED ORDER — SODIUM CHLORIDE 0.9% FLUSH: 3.0000 mL | INTRAVENOUS | Status: DC | PRN

## 2016-02-04 MED ORDER — HYDROCODONE-ACETAMINOPHEN 5-325 MG PO TABS
1.0000 | ORAL_TABLET | ORAL | Status: DC | PRN
  Administered 2016-02-04 – 2016-02-05 (×3): 1 via ORAL
  Filled 2016-02-04 (×3): qty 1

## 2016-02-04 MED ORDER — EPHEDRINE 5 MG/ML INJ
INTRAVENOUS | Status: AC
  Filled 2016-02-04: qty 10

## 2016-02-04 MED ORDER — LIDOCAINE HCL (PF) 2 % IJ SOLN
INTRAMUSCULAR | Status: AC
  Filled 2016-02-04: qty 2

## 2016-02-04 MED ORDER — MORPHINE SULFATE (PF) 0.5 MG/ML IJ SOLN
INTRAMUSCULAR | Status: DC | PRN
  Administered 2016-02-04: .1 mg via EPIDURAL

## 2016-02-04 MED ORDER — FERROUS SULFATE 325 (65 FE) MG PO TABS
325.0000 mg | ORAL_TABLET | Freq: Two times a day (BID) | ORAL | Status: DC
  Administered 2016-02-04 – 2016-02-07 (×6): 325 mg via ORAL
  Filled 2016-02-04 (×6): qty 1

## 2016-02-04 MED ORDER — DIPHENHYDRAMINE HCL 50 MG/ML IJ SOLN: 12.5000 mg | INTRAMUSCULAR | Status: DC | PRN

## 2016-02-04 MED ORDER — BUPIVACAINE 0.25 % ON-Q PUMP DUAL CATH 400 ML
INJECTION | Status: DC
  Filled 2016-02-04: qty 400

## 2016-02-04 MED ORDER — FENTANYL CITRATE (PF) 100 MCG/2ML IJ SOLN: 25.0000 ug | INTRAMUSCULAR | Status: DC | PRN

## 2016-02-04 MED ORDER — ONDANSETRON HCL 4 MG/2ML IJ SOLN: 4.0000 mg | Freq: Three times a day (TID) | INTRAMUSCULAR | Status: DC | PRN

## 2016-02-04 MED ORDER — SOD CITRATE-CITRIC ACID 500-334 MG/5ML PO SOLN
30.0000 mL | ORAL | Status: AC
  Administered 2016-02-04: 30 mL via ORAL
  Filled 2016-02-04: qty 30

## 2016-02-04 MED ORDER — LACTATED RINGERS IV SOLN
INTRAVENOUS | Status: DC
  Administered 2016-02-04: 07:00:00 via INTRAVENOUS

## 2016-02-04 MED ORDER — MORPHINE SULFATE (PF) 0.5 MG/ML IJ SOLN
INTRAMUSCULAR | Status: AC
  Filled 2016-02-04: qty 10

## 2016-02-04 MED ORDER — BUPIVACAINE HCL (PF) 0.5 % IJ SOLN
INTRAMUSCULAR | Status: DC | PRN
  Administered 2016-02-04: 10 mL

## 2016-02-04 MED ORDER — PHENYLEPHRINE 40 MCG/ML (10ML) SYRINGE FOR IV PUSH (FOR BLOOD PRESSURE SUPPORT)
PREFILLED_SYRINGE | INTRAVENOUS | Status: DC | PRN
  Administered 2016-02-04: 40 ug via INTRAVENOUS
  Administered 2016-02-04: 120 ug via INTRAVENOUS
  Administered 2016-02-04 (×2): 80 ug via INTRAVENOUS

## 2016-02-04 MED ORDER — ONDANSETRON HCL 4 MG/2ML IJ SOLN
INTRAMUSCULAR | Status: AC
  Filled 2016-02-04: qty 2

## 2016-02-04 MED ORDER — OXYCODONE HCL 5 MG PO TABS
5.0000 mg | ORAL_TABLET | Freq: Once | ORAL | Status: AC | PRN
  Administered 2016-02-04: 5 mg via ORAL
  Filled 2016-02-04: qty 1

## 2016-02-04 MED ORDER — DIPHENHYDRAMINE HCL 25 MG PO CAPS: 25.0000 mg | ORAL_CAPSULE | ORAL | Status: DC | PRN

## 2016-02-04 MED ORDER — OXYCODONE HCL 5 MG/5ML PO SOLN
5.0000 mg | Freq: Once | ORAL | Status: AC | PRN
  Filled 2016-02-04: qty 5

## 2016-02-04 MED ORDER — LACTATED RINGERS IV SOLN
INTRAVENOUS | Status: DC
Start: 2016-02-04 — End: 2016-02-04
  Administered 2016-02-04: 06:00:00 via INTRAVENOUS

## 2016-02-04 MED ORDER — COCONUT OIL OIL: 1.0000 "application " | TOPICAL_OIL | Status: DC | PRN

## 2016-02-04 MED ORDER — OXYTOCIN 40 UNITS IN LACTATED RINGERS INFUSION - SIMPLE MED
2.5000 [IU]/h | INTRAVENOUS | Status: DC
  Administered 2016-02-04: 2.5 [IU]/h via INTRAVENOUS

## 2016-02-04 MED ORDER — OXYCODONE-ACETAMINOPHEN 5-325 MG PO TABS: 1.0000 | ORAL_TABLET | ORAL | Status: DC | PRN

## 2016-02-04 MED ORDER — PROPOFOL 10 MG/ML IV BOLUS
INTRAVENOUS | Status: AC
  Filled 2016-02-04: qty 20

## 2016-02-04 MED ORDER — DIBUCAINE 1 % RE OINT: 1.0000 "application " | TOPICAL_OINTMENT | RECTAL | Status: DC | PRN

## 2016-02-04 MED ORDER — CEFAZOLIN SODIUM-DEXTROSE 2-4 GM/100ML-% IV SOLN
2.0000 g | INTRAVENOUS | Status: AC
  Administered 2016-02-04: 2 g via INTRAVENOUS
  Filled 2016-02-04 (×2): qty 100

## 2016-02-04 MED ORDER — SENNOSIDES-DOCUSATE SODIUM 8.6-50 MG PO TABS
2.0000 | ORAL_TABLET | ORAL | Status: DC
  Administered 2016-02-04 – 2016-02-06 (×3): 2 via ORAL
  Filled 2016-02-04 (×3): qty 2

## 2016-02-04 MED ORDER — NALOXONE HCL 2 MG/2ML IJ SOSY
1.0000 ug/kg/h | PREFILLED_SYRINGE | INTRAVENOUS | Status: DC | PRN
  Filled 2016-02-04: qty 2

## 2016-02-04 MED ORDER — LACTATED RINGERS IV SOLN
INTRAVENOUS | Status: DC
  Administered 2016-02-04 – 2016-02-05 (×2): via INTRAVENOUS

## 2016-02-04 MED ORDER — ONDANSETRON HCL 4 MG/2ML IJ SOLN
INTRAMUSCULAR | Status: DC | PRN
  Administered 2016-02-04: 4 mg via INTRAVENOUS

## 2016-02-04 MED ORDER — BUPIVACAINE IN DEXTROSE 0.75-8.25 % IT SOLN
INTRATHECAL | Status: DC | PRN
  Administered 2016-02-04: 1.8 mL via INTRATHECAL

## 2016-02-04 MED ORDER — OXYTOCIN 40 UNITS IN LACTATED RINGERS INFUSION - SIMPLE MED
INTRAVENOUS | Status: DC | PRN
  Administered 2016-02-04: 500 mL via INTRAVENOUS

## 2016-02-04 MED ORDER — PRENATAL MULTIVITAMIN CH
1.0000 | ORAL_TABLET | Freq: Every day | ORAL | Status: DC
  Administered 2016-02-05 – 2016-02-07 (×3): 1 via ORAL
  Filled 2016-02-04 (×3): qty 1

## 2016-02-04 MED ORDER — ACETAMINOPHEN 500 MG PO TABS: 1000.0000 mg | ORAL_TABLET | Freq: Four times a day (QID) | ORAL | Status: DC

## 2016-02-04 MED ORDER — SIMETHICONE 80 MG PO CHEW
80.0000 mg | CHEWABLE_TABLET | Freq: Three times a day (TID) | ORAL | Status: DC
  Administered 2016-02-04 – 2016-02-07 (×9): 80 mg via ORAL
  Filled 2016-02-04 (×9): qty 1

## 2016-02-04 MED ORDER — BUPIVACAINE 0.25 % ON-Q PUMP DUAL CATH 400 ML
400.0000 mL | INJECTION | Status: DC
  Filled 2016-02-04 (×3): qty 400

## 2016-02-04 MED ORDER — OXYCODONE HCL 5 MG PO TABS: 5.0000 mg | ORAL_TABLET | Freq: Four times a day (QID) | ORAL | Status: DC | PRN

## 2016-02-04 MED ORDER — NALOXONE HCL 0.4 MG/ML IJ SOLN: 0.4000 mg | INTRAMUSCULAR | Status: DC | PRN

## 2016-02-04 MED ORDER — KETOROLAC TROMETHAMINE 30 MG/ML IJ SOLN
30.0000 mg | Freq: Four times a day (QID) | INTRAMUSCULAR | Status: DC | PRN
  Administered 2016-02-04 – 2016-02-05 (×3): 30 mg via INTRAVENOUS
  Filled 2016-02-04 (×3): qty 1

## 2016-02-04 SURGICAL SUPPLY — 30 items
CANISTER SUCT 3000ML (MISCELLANEOUS) ×2 IMPLANT
CATH KIT ON-Q SILVERSOAK 5IN (CATHETERS) ×4 IMPLANT
CHLORAPREP W/TINT 26ML (MISCELLANEOUS) ×4 IMPLANT
CUP MEDICINE 2OZ PLAST GRAD ST (MISCELLANEOUS) ×2 IMPLANT
DRSG OPSITE POSTOP 4X10 (GAUZE/BANDAGES/DRESSINGS) ×2 IMPLANT
DRSG TELFA 3X8 NADH (GAUZE/BANDAGES/DRESSINGS) ×2 IMPLANT
ELECT REM PT RETURN 9FT ADLT (ELECTROSURGICAL) ×2
ELECTRODE REM PT RTRN 9FT ADLT (ELECTROSURGICAL) ×1 IMPLANT
GAUZE SPONGE 4X4 12PLY STRL (GAUZE/BANDAGES/DRESSINGS) ×2 IMPLANT
GLOVE BIO SURGEON STRL SZ8 (GLOVE) ×8 IMPLANT
GLOVE BIOGEL PI IND STRL 7.5 (GLOVE) ×4 IMPLANT
GLOVE BIOGEL PI INDICATOR 7.5 (GLOVE) ×4
GLOVE SKINSENSE NS SZ7.0 (GLOVE) ×4
GLOVE SKINSENSE STRL SZ7.0 (GLOVE) ×4 IMPLANT
GOWN STRL REUS W/ TWL LRG LVL3 (GOWN DISPOSABLE) ×4 IMPLANT
GOWN STRL REUS W/TWL LRG LVL3 (GOWN DISPOSABLE) ×4
LIQUID BAND (GAUZE/BANDAGES/DRESSINGS) ×2 IMPLANT
NDL SAFETY 22GX1.5 (NEEDLE) ×2 IMPLANT
NS IRRIG 1000ML POUR BTL (IV SOLUTION) ×2 IMPLANT
PACK C SECTION AR (MISCELLANEOUS) ×2 IMPLANT
PAD OB MATERNITY 4.3X12.25 (PERSONAL CARE ITEMS) ×4 IMPLANT
PAD PREP 24X41 OB/GYN DISP (PERSONAL CARE ITEMS) ×2 IMPLANT
STRIP CLOSURE SKIN 1/2X4 (GAUZE/BANDAGES/DRESSINGS) ×2 IMPLANT
SUT 2-0 PL GUT LIGAPAK (SUTURE) IMPLANT
SUT MNCRL AB 4-0 PS2 18 (SUTURE) ×2 IMPLANT
SUT PDS AB 1 TP1 96 (SUTURE) ×4 IMPLANT
SUT VIC AB 0 CT1 36 (SUTURE) ×8 IMPLANT
SUT VIC AB 1 CT1 36 (SUTURE) ×2 IMPLANT
SWABSTK COMLB BENZOIN TINCTURE (MISCELLANEOUS) ×2 IMPLANT
SYRINGE 10CC LL (SYRINGE) ×2 IMPLANT
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
## 2016-02-04 NOTE — Anesthesia Procedure Notes (Signed)
Spinal  Patient location during procedure: OR Start time: 02/04/2016 7:37 AM End time: 02/04/2016 8:02 AM Staffing Anesthesiologist: Andria Frames Resident/CRNA: Doreen Salvage Other anesthesia staff: Emmie Niemann Performed: anesthesiologist, resident/CRNA and other anesthesia staff  Preanesthetic Checklist Completed: patient identified, site marked, surgical consent, pre-op evaluation, timeout performed, IV checked, risks and benefits discussed and monitors and equipment checked Spinal Block Patient position: sitting Prep: ChloraPrep Patient monitoring: heart rate, continuous pulse ox, blood pressure and cardiac monitor Approach: midline Location: L3-4 Injection technique: single-shot Needle Needle type: Whitacre and Introducer  Needle gauge: 24 G Needle length: 9 cm Assessment Sensory level: T3 Additional Notes First attempts by Vicente Serene, then Penda Venturi, then [place].  Patient very deep.  Multiple paresthesias that resolved with needle retraction and redirection.    Negative paresthesia with final attempt. Negative blood return. Positive free-flowing CSF. Expiration date of kit checked and confirmed. Patient tolerated procedure well, without complications.

## 2016-02-04 NOTE — Transfer of Care (Signed)
Immediate Anesthesia Transfer of Care Note  Patient: Sandra Castaneda  Procedure(s) Performed: Procedure(s): CESAREAN SECTION WITH BILATERAL TUBAL LIGATION (N/A)  Patient Location: PACU  Anesthesia Type:Spinal  Level of Consciousness: awake, alert  and oriented  Airway & Oxygen Therapy: Patient Spontanous Breathing and Patient connected to face mask oxygen  Post-op Assessment: Report given to RN and Post -op Vital signs reviewed and stable  Post vital signs: Reviewed and stable  Last Vitals:  Vitals:   02/04/16 0721 02/04/16 0930  BP: 126/65 (!) 99/54  Pulse: 79 60  Resp: 16 12  Temp: 36.7 C 36.3 C    Complications: No apparent anesthesia complications

## 2016-02-04 NOTE — Op Note (Signed)
Cesarean Section Operative Note    Sandra Castaneda   02/04/2016   Pre-operative Diagnosis:  1) intrauterine pregnancy at 6881w3d  2) history of cesarean delivery, desires repeat 3) desires permanent sterility  Post-operative Diagnosis:  1) intrauterine pregnancy at 6781w3d  2) history of cesarean delivery, desires repeat 3) desires permanent sterility   Procedure:  1) Repeat low transverse cesarean section via Pfannenstiel incision with double layer uterine closure 2) bilateral tubal ligation via Pomeroy method  Surgeon: Surgeon(s) and Role:    * Conard NovakStephen D Jackson, MD - Primary   Anesthesia: spinal   Findings:  1) normal appearing gravid uterus, fallopian tubes, and ovaries 2) viable female infant with weight of 6 lb 13 oz (3,080 grams), APGARs 8 at 1 minute and 9 at 5 minutes   Estimated Blood Loss: 900 mL  Total IV Fluids: 1,000 ml   Urine Output: 100 mL clear urine at the end of the procedure  Specimens: segment of right and left fallopian tubes  Complications: no complications  Disposition: PACU - hemodynamically stable.   Maternal Condition: stable   Baby condition / location:  Couplet care / Skin to Skin  Procedure Details:  The patient was seen in the Holding Room. The risks, benefits, complications, treatment options, and expected outcomes were discussed with the patient. The patient concurred with the proposed plan, giving informed consent. identified as Sandra Castaneda and the procedure verified as C-Section Delivery. A Time Out was held and the above information confirmed.   After induction of anesthesia, the patient was draped and prepped in the usual sterile manner. A Pfannenstiel incision was made and carried down through the subcutaneous tissue to the fascia. Fascial incision was made and extended transversely. The fascia was separated from the underlying rectus tissue superiorly and inferiorly. There rectus muscles were adherent to each other and were  carefully separated with great care taken to avoid the bladder.  The peritoneum was identified and entered. Peritoneal incision was extended longitudinally. The bladder flap sharply freed from the lower uterine segment. A low transverse uterine incision was made and the hysterotomy was extended with cranial-caudal tension. Delivered from cephalic presentation was a 3,080 gram Living newborn infant(s) or Female with Apgar scores of 8 at one minute and 9 at five minutes. Cord ph was not sent the umbilical cord was clamped and cut cord blood was not obtained for evaluation. The placenta was removed Intact and appeared normal. The uterine outline, tubes and ovaries appeared normal. The uterine incision was closed with running locked sutures of 0 Vicryl.  A second layer of the same suture was thrown in an imbricating fashion.  Hemostasis was assured.    The tubal ligation portion of the procedure was performed at this point.  The left fallopian tube was identified and followed out to the fimbriated end.  A Babcock clamp was used to grasp the tube in the mid-isthmic portion and two 2-0 plain gut sutures were used to ligate the tube.  An approximately 3cm segment of tube was removed with hemostasis assured.  The same procedure was performed on the right fallopian tube with hemostasis noted.    The uterus was returned to the abdomen and the paracolic gutters were cleared of all clots and debris.  The rectus muscles were inspected and found to be hemostatic.  The On-Q catheter pumps were inserted in accordance with the manufacturer's recommendations.  The catheters were inserted approximately 4cm cephelad to the incision line, approximately 1cm apart, straddling  the midline.  They were inserted to a depth of the 4th mark. They were positioned superficial to the rectus abdominus muscles and deep to the rectus fascia.    The fascia was then reapproximated with running sutures of 1-0 PDS, looped. The subcutaneous tissue  was reapproximated using 2-0 plain gut such that no greater than 2cm of dead space remained. The subcuticular closure was performed using 4-0 monocryl. The skin closure was reinforced using benzoin and 1/2" steri-strips.  The On-Q catheters were bolused with 5 mL of 0.5% marcaine plain for a total of 10 mL.  The catheters were affixed to the skin with surgical skin glue, steri-strips, and tegaderm.    Instrument, sponge, and needle counts were correct prior the abdominal closure and were correct at the conclusion of the case.  The patient received Ancef 2 gram IV prior to skin incision (within 30 minutes). For VTE prophylaxis she was wearing SCDs throughout the case.  Signed: Conard Novak, MD 02/04/2016 9:22 AM

## 2016-02-04 NOTE — Discharge Summary (Signed)
OB Discharge Summary     Patient Name: Sandra Castaneda DOB: Mar 27, 1985 MRN: [id number]  Date of admission: 02/04/2016 Delivering MD: Conard Novak, MD  Date of Delivery: 02/04/2016  Date of discharge: 02/07/2016  Admitting diagnosis: PRIOR CSECTION, DESIRE FOR PERMANENT STERILITY Intrauterine pregnancy: 100w3d     Secondary diagnosis: None     Discharge diagnosis: Term Pregnancy Delivered                                                                                                Post partum procedures:None  Augmentation: n/a  Complications: None  Hospital course:  Sceduled C/S   31 y.o. yo G2P1001 at [redacted]w[redacted]d was admitted to the hospital 02/04/2016 for scheduled cesarean section with the following indication:Elective Repeat.  Membrane Rupture Time/Date:   ,    Patient delivered a Viable infant.02/04/2016  Details of operation can be found in separate operative note.  Pateint had an uncomplicated postpartum course.  She is ambulating, tolerating a regular diet, passing flatus, and urinating well. Patient is discharged home in stable condition on  02/07/16          Physical exam  Vitals:   02/06/16 2003 02/06/16 2300 02/07/16 0347 02/07/16 0828  BP: (!) 117/59   (!) 121/59  Pulse: 77   73  Resp: 20   17  Temp: 98 F (36.7 C) 98 F (36.7 C) 98.8 F (37.1 C) 97.8 F (36.6 C)  TempSrc: Oral Oral Oral Oral  SpO2:    99%  Weight:      Height:       General: alert, cooperative and no distress Lochia: appropriate Uterine Fundus: firm Incision: Healing well with no significant drainage, Dressing is clean, dry, and intact DVT Evaluation: No evidence of DVT seen on physical exam. No cords or calf tenderness. No significant calf/ankle edema.  Labs: Lab Results  Component Value Date   WBC 11.0 02/05/2016   HGB 9.6 (L) 02/05/2016   HCT 27.6 (L) 02/05/2016   MCV 91.0 02/05/2016   PLT 216 02/05/2016   CMP Latest Ref Rng & Units 09/21/2015  Glucose 65 - 99 mg/dL 87  BUN 6 -  20 mg/dL <1(X)  Creatinine 9.14 - 1.00 mg/dL 7.82(N)  Sodium 562 - 130 mmol/L 134(L)  Potassium 3.5 - 5.1 mmol/L 3.2(L)  Chloride 101 - 111 mmol/L 105  CO2 22 - 32 mmol/L 20(L)  Calcium 8.9 - 10.3 mg/dL 8.6(V)  Total Protein 6.5 - 8.1 g/dL 6.9  Total Bilirubin 0.3 - 1.2 mg/dL <7.8(I)  Alkaline Phos 38 - 126 U/L 57  AST 15 - 41 U/L 25  ALT 14 - 54 U/L 21    Discharge instruction: per After Visit Summary.  Medications:  Allergies as of 02/07/2016   No Known Allergies     Medication List    STOP taking these medications   cephALEXin 500 MG capsule Commonly known as:  KEFLEX   ondansetron 4 MG disintegrating tablet Commonly known as:  ZOFRAN ODT     TAKE these medications   ibuprofen 600 MG tablet Commonly known as:  ADVIL,MOTRIN Take  1 tablet (600 mg total) by mouth every 6 (six) hours.   oxyCODONE-acetaminophen 5-325 MG tablet Commonly known as:  PERCOCET/ROXICET Take 2 tablets by mouth every 4 (four) hours as needed for severe pain (pain score >7/10).   prenatal vitamin w/FE, FA 27-1 MG Tabs tablet Take 1 tablet by mouth daily at 12 noon.   ranitidine 150 MG tablet Commonly known as:  ZANTAC Take 150 mg by mouth 2 (two) times daily.       Diet: routine diet  Activity: Advance as tolerated. Pelvic rest for 6 weeks.   Outpatient follow up: Follow-up Information    Conard Novak, MD Follow up in 1 week(s).   Specialty:  Obstetrics and Gynecology Why:  postop incision check Contact information: [address] [place] [address] [phone]             Postpartum contraception: Tubal Ligation Rhogam Given postpartum: no Rubella vaccine given postpartum: no Varicella vaccine given postpartum: no TDaP given antepartum or postpartum: AP  Newborn Data: Live born female  Birth Weight: 6 lb 12.6 oz (3080 g) APGAR: 8, 9   Baby Feeding: formula  Disposition:home with mother  SIGNED: Thomasene Mohair, MD 02/07/2016 10:14 AM

## 2016-02-04 NOTE — Lactation Note (Signed)
This note was copied from a baby's chart. Lactation Consultation Note  Patient Name: Sandra Castaneda JYNWG'NToday's Date: 02/04/2016 Reason for consult: Follow-up assessment Baby was able to latch and nurse well this time if I helped to shape nipple and help baby maintain deep latch.  Mom unable to due to large breasts, IVs and overall recovery from C/S. Sensing her frustration and challenge I offered (and then applied after she agreed), a 24 mm nipple shield. Baby stayed latched on better and had more swallows with it on, but Mom said it didn't feel good" despite everything looking fine and hearing baby swallow.  When BF support person in room with her, she is willing to try breastfeeding, but now that she has friend in room discussing formula, she is no longer interested in trying to breastfeed now. Mom verbalized understanding options to BFing such as pump/bottle or just bottle formula, etc and pros/cons issues with each. For now, she has decided to pump and offer bottle of colostrum/formula. I gave her 30mm flanges and stayed with her this first session, explaining each step. Due to breast size, etc she chooses to pump one side at a time. She obtained 2 ml. I added 8ml of Similac to make 10 ml total for Mom to feed to baby with slow flow when RN is done assessing her. I just learned that Mom had 900 EBL. There is potential for milk supply issues R/T to that loss, but each scenario is unique. After first successful breastfeeding session after delivery, Baby has had 3 stools and a large void and has been content.   Maternal Data    Feeding    [facility]                      Lactation Tools Discussed/Used     Consult Status      Sandra Castaneda 02/04/2016, 4:47 PM

## 2016-02-04 NOTE — H&P (Signed)
History and Physical Interval Note:  Sandra Castaneda  has presented today for surgery, with the diagnosis of PRIOR CSECTION, DESIRE FOR PERMANENT STERILITY  The various methods of treatment have been discussed with the patient and family. After consideration of risks, benefits and other options for treatment, the patient has consented to  Procedure(s): CESAREAN SECTION WITH BILATERAL TUBAL LIGATION (N/A) as a surgical intervention .  The patient's history has been reviewed, patient examined, no change in status, stable for surgery.  I have reviewed the patient's chart and labs.  Questions were answered to the patient's satisfaction.    Conard NovakJackson, Tyaire Odem D, MD 02/04/2016 7:19 AM

## 2016-02-05 LAB — CBC
HCT: 27.6 % — ABNORMAL LOW (ref 35.0–47.0)
Hemoglobin: 9.6 g/dL — ABNORMAL LOW (ref 12.0–16.0)
MCH: 31.5 pg (ref 26.0–34.0)
MCHC: 34.6 g/dL (ref 32.0–36.0)
MCV: 91 fL (ref 80.0–100.0)
PLATELETS: 216 10*3/uL (ref 150–440)
RBC: 3.04 MIL/uL — ABNORMAL LOW (ref 3.80–5.20)
RDW: 14 % (ref 11.5–14.5)
WBC: 11 10*3/uL (ref 3.6–11.0)

## 2016-02-05 LAB — SURGICAL PATHOLOGY

## 2016-02-05 MED ORDER — IBUPROFEN 600 MG PO TABS
600.0000 mg | ORAL_TABLET | Freq: Four times a day (QID) | ORAL | Status: DC
  Administered 2016-02-05 – 2016-02-07 (×9): 600 mg via ORAL
  Filled 2016-02-05 (×9): qty 1

## 2016-02-05 NOTE — Anesthesia Postprocedure Evaluation (Signed)
Anesthesia Post Note  Patient: Sandra Castaneda  Procedure(s) Performed: Procedure(s) (LRB): CESAREAN SECTION WITH BILATERAL TUBAL LIGATION (N/A)  Patient location during evaluation: Mother Baby Anesthesia Type: Spinal Level of consciousness: awake, awake and alert and oriented Pain management: pain level controlled Vital Signs Assessment: post-procedure vital signs reviewed and stable Respiratory status: spontaneous breathing Cardiovascular status: blood pressure returned to baseline Postop Assessment: no headache, no signs of nausea or vomiting, no backache and adequate PO intake Anesthetic complications: no     Last Vitals:  Vitals:   02/04/16 2329 02/05/16 0339  BP: (!) 112/44 (!) 112/52  Pulse: 66 62  Resp: 18 18  Temp: 36.4 C 36.8 C    Last Pain:  Vitals:   02/05/16 0339  TempSrc: Oral  PainSc:                  Sandra Castaneda

## 2016-02-05 NOTE — Lactation Note (Signed)
Lactation Consultation Note  Patient Name: Sandra Castaneda ZOXWR'UToday's Date: 02/05/2016   Update:   Mom never used the cabbage leaves. She just called for LC to help her start pumping again. She claims she was just too tired and feeling bad to pump earlier today. I reviewed what it takes to pump well and have a good supply and avoid engorgement. She agreed to pump and [facility] pump when she is discharged. [facility] and am waiting for a reply. My verbalized understanding of pumping plan. I notified RN of change in feeding/pumping plan.   Maternal Data    Feeding    LATCH Score/Interventions                      Lactation Tools Discussed/Used     Consult Status      Sandra Castaneda Sandra Castaneda 02/05/2016, 1:11 PM

## 2016-02-05 NOTE — Lactation Note (Signed)
This note was copied from a baby's chart. Lactation Consultation Note  Patient Name: Sandra Castaneda Today's Date: 02/05/2016  Today Mom c/o "breasts are sore and full". They are heavier than yesterday, but not engorged. I told her the care plan for her breasts depend on her decision to breastfeed, pump or just bottle feed formula (she has not pumped since yesterday). All choices explained to her. She told RN Sandra Castaneda that she just wants to bottle feed formula and no pumping. I brought her cabbage leaves. I RN gave her pain medicine.    Maternal Data    Feeding    LATCH Score/Interventions                      Lactation Tools Discussed/Used     Consult Status      Sandra Castaneda 02/05/2016, 11:11 AM

## 2016-02-05 NOTE — Progress Notes (Signed)
Admit Date: 02/04/2016 Today's Date: 02/05/2016  Subjective: Postpartum Day 1: Cesarean Delivery Patient reports tolerating PO, + flatus and no problems voiding.    Objective: Vital signs in last 24 hours: Temp:  [97.4 F (36.3 C)-98.4 F (36.9 C)] 98.2 F (36.8 C) (01/03 0339) Pulse Rate:  [50-66] 62 (01/03 0339) Resp:  [11-18] 18 (01/03 0339) BP: (92-112)/(44-64) 112/52 (01/03 0339) SpO2:  [98 %-100 %] 98 % (01/03 0339)  Physical Exam:  General: alert, cooperative and no distress Lochia: appropriate Uterine Fundus: firm Incision: healing well DVT Evaluation: No evidence of DVT seen on physical exam.   Recent Labs  02/04/16 0602 02/05/16 0513  HGB 11.6* 9.6*  HCT 33.8* 27.6*    Assessment/Plan: Status post Cesarean section. Doing well postoperatively.  Continue current care. S/p BTL. Monitor anemia  Sandra Castaneda 02/05/2016, 7:58 AM

## 2016-02-05 NOTE — Anesthesia Post-op Follow-up Note (Signed)
  Anesthesia Pain Follow-up Note  Patient: Sandra Castaneda  Day #: 1  Date of Follow-up: 02/05/2016 Time: 7:20 AM  Last Vitals:  Vitals:   02/04/16 2329 02/05/16 0339  BP: (!) 112/44 (!) 112/52  Pulse: 66 62  Resp: 18 18  Temp: 36.4 C 36.8 C    Level of Consciousness: alert  Pain: none   Side Effects:None  Catheter Site Exam:clean, dry, no drainage     Plan: D/C from anesthesia care at surgeon's request  [name]

## 2016-02-06 NOTE — Progress Notes (Signed)
Subjective:  Doing well, pain well controlled.  Tolerating general diet.  Minimal lochia   Objective:  Blood pressure 107/66, pulse 65, temperature 98 F (36.7 C), temperature source Oral, resp. rate 18, height 5\' 10"  (1.778 m), weight 252 lb (114.3 kg), last menstrual period 09/14/2015, SpO2 99 %.  General: NAD Pulmonary: no increased work of breathing Abdomen: non-distended, non-tender, fundus firm at level of umbilicus Incision: D/C/I Extremities: no edema, no erythema, no tenderness  Results for orders placed or performed during the hospital encounter of 02/04/16 (from the past 72 hour(s))  Type and screen [facility]     Status: None   Collection Time: 02/04/16  6:02 AM  Result Value Ref Range   ABO/RH(D) AB POS    Antibody Screen NEG    Sample Expiration 02/07/2016   CBC with Differential/Platelet     Status: Abnormal   Collection Time: 02/04/16  6:02 AM  Result Value Ref Range   WBC 13.8 (H) 3.6 - 11.0 K/uL   RBC 3.80 3.80 - 5.20 MIL/uL   Hemoglobin 11.6 (L) 12.0 - 16.0 g/dL   HCT 14.7 (L) 82.9 - 56.2 %   MCV 89.0 80.0 - 100.0 fL   MCH 30.6 26.0 - 34.0 pg   MCHC 34.3 32.0 - 36.0 g/dL   RDW 13.0 86.5 - 78.4 %   Platelets 277 150 - 440 K/uL   Neutrophils Relative % 63 %   Neutro Abs 8.8 (H) 1.4 - 6.5 K/uL   Lymphocytes Relative 26 %   Lymphs Abs 3.5 1.0 - 3.6 K/uL   Monocytes Relative 9 %   Monocytes Absolute 1.3 (H) 0.2 - 0.9 K/uL   Eosinophils Relative 1 %   Eosinophils Absolute 0.1 0 - 0.7 K/uL   Basophils Relative 1 %   Basophils Absolute 0.1 0 - 0.1 K/uL  Surgical pathology     Status: None   Collection Time: 02/04/16  8:45 AM  Result Value Ref Range   SURGICAL PATHOLOGY      Surgical Pathology CASE: [id number] PATIENT: Sandra Castaneda Surgical Pathology Report     SPECIMEN SUBMITTED: A. Tube segment, right B. Tube segment, left  CLINICAL HISTORY: None provided  PRE-OPERATIVE DIAGNOSIS: Prior C-section, desire for  permanent sterility  POST-OPERATIVE DIAGNOSIS: Same as pre-op     DIAGNOSIS: A. FALLOPIAN TUBE, RIGHT; PARTIAL SALPINGECTOMY FOR STERILIZATION: - NO PATHOLOGIC CHANGES; COMPLETE CROSS SECTION OF LUMEN PRESENT.  B. FALLOPIAN TUBE, LEFT; PARTIAL SALPINGECTOMY FOR STERILIZATION: - NO PATHOLOGIC CHANGES; COMPLETE CROSS SECTION OF LUMEN PRESENT.   GROSS DESCRIPTION: A. Labeled: right tube segment Size: 2.0 x 0.9 x 0.6 cm Other findings: purple tubular fragment, one end possibly fimbria  Block summary: 1- representative cross-sections and longitudinal possible fimbria   B. Labeled: left tube segment Size: 1.6 x 0.9 x 0.8 cm Other findings: pink tubular fragment  Block summary: 1- representative cross-se ctions  Final Diagnosis performed by Ronald Lobo, MD.  Electronically signed 02/05/2016 6:59:48PM    The electronic signature indicates that the named Attending Pathologist has evaluated the specimen  Technical component performed at [facility], [address], [place], [place] [address] Lab: [phone] Dir: Titus Dubin. Cato Mulligan, MD  Professional component performed at [facility], [place], [facility], [address] [place], [place], [place] [address] Lab: [phone] Dir: Georgiann Cocker. Rubinas, MD    CBC     Status: Abnormal   Collection Time: 02/05/16  5:13 AM  Result Value Ref Range   WBC 11.0 3.6 - 11.0 K/uL  RBC 3.04 (L) 3.80 - 5.20 MIL/uL   Hemoglobin 9.6 (L) 12.0 - 16.0 g/dL   HCT 16.127.6 (L) 09.635.0 - 04.547.0 %   MCV 91.0 80.0 - 100.0 fL   MCH 31.5 26.0 - 34.0 pg   MCHC 34.6 32.0 - 36.0 g/dL   RDW 40.914.0 81.111.5 - 91.414.5 %   Platelets 216 150 - 440 K/uL     Assessment:   31 y.o. G2P1001 postoperativeday # 2 RLTCS & BTL   Plan:  1) Acute blood loss anemia - hemodynamically stable and asymptomatic - po ferrous sulfate  2) --/--/AB POS (01/02 0602) / Rubella Immune (11/17 0000) / Varicella Immune  3) Disposition - anticipate discharge POD3

## 2016-02-07 MED ORDER — OXYCODONE-ACETAMINOPHEN 5-325 MG PO TABS: 2.0000 | ORAL_TABLET | ORAL | 0 refills | Status: DC | PRN

## 2016-02-07 MED ORDER — IBUPROFEN 600 MG PO TABS: 600.0000 mg | ORAL_TABLET | Freq: Four times a day (QID) | ORAL | 0 refills | Status: DC

## 2016-02-07 NOTE — Progress Notes (Signed)
Patient discharged home with infant. Discharge instructions, prescriptions and follow up appointment given to and reviewed with patient. Patient verbalized understanding. Escorted out via wheelchair by auxiliary.

## 2016-07-21 ENCOUNTER — Ambulatory Visit: Payer: Self-pay | Admitting: Obstetrics and Gynecology

## 2016-07-22 ENCOUNTER — Ambulatory Visit (INDEPENDENT_AMBULATORY_CARE_PROVIDER_SITE_OTHER): Payer: Commercial Managed Care - PPO | Admitting: Obstetrics and Gynecology

## 2016-07-22 ENCOUNTER — Encounter: Payer: Self-pay | Admitting: Obstetrics and Gynecology

## 2016-07-22 VITALS — BP 118/74 | Ht 70.0 in | Wt 246.0 lb

## 2016-07-22 DIAGNOSIS — N946 Dysmenorrhea, unspecified: Secondary | ICD-10-CM | POA: Diagnosis not present

## 2016-07-22 DIAGNOSIS — N921 Excessive and frequent menstruation with irregular cycle: Secondary | ICD-10-CM | POA: Insufficient documentation

## 2016-07-22 MED ORDER — LEVONORGEST-ETH ESTRAD 91-DAY 0.15-0.03 MG PO TABS: 1.0000 | ORAL_TABLET | Freq: Every day | ORAL | 1 refills | Status: DC

## 2016-07-22 NOTE — Progress Notes (Signed)
Obstetrics & Gynecology Office Visit   Chief Complaint  Patient presents with  . Menstrual Problem    History of Present Illness: 31 y.o. 932P2002 female who is 6 months status post repeat c-sectin and BTL who presents for issues of painful, irregular menses, associated with generalized body aches. She denies fevers and chills. She has aching in her back, her head, legs, etc. This was present to a small extent prior to her most recent pregnancy.  She has tried ibuprofen and reports taking 800-1,000 mg ibuprofen frequently. This does not help the pain.  She has seen other providers for similar symptoms.  She state she has been told she might have endometriosis.    Past Medical History:  Diagnosis Date  . Ovarian cyst 2007    Past Surgical History:  Procedure Laterality Date  . CESAREAN SECTION  2008   FTP  . CESAREAN SECTION WITH BILATERAL TUBAL LIGATION N/A 02/04/2016   Procedure: CESAREAN SECTION WITH BILATERAL TUBAL LIGATION;  Surgeon: Conard NovakStephen D Zamira Hickam, MD;  Location: [facility] ORS;  Service: Obstetrics;  Laterality: N/A;  . LAPAROTOMY  2007    Gynecologic History: Patient's last menstrual period was 07/04/2016.  Obstetric History: G2P1001  Family History  Problem Relation Age of Onset  . Brain cancer Mother   . Hypertension Mother   . Ovarian cancer Mother   . Lung cancer Mother   . Diabetes Mellitus I Father   . Hypertension Brother   . Ovarian cancer Maternal Aunt   . Diabetes Mellitus II Paternal Aunt   . Diabetes Mellitus II Paternal Grandmother     Social History   Social History  . Marital status: Single    Spouse name: N/A  . Number of children: N/A  . Years of education: N/A   Occupational History  . Not on file.   Social History Main Topics  . Smoking status: Current Every Day Smoker    Packs/day: 0.25  . Smokeless tobacco: Never Used  . Alcohol use No  . Drug use: No  . Sexual activity: Yes    Birth control/ protection: Surgical   Other Topics  Concern  . Not on file   Social History Narrative  . No narrative on file    No Known Allergies  Medications: none   Review of Systems  Constitutional: Negative.   HENT: Negative.   Eyes: Negative.   Respiratory: Negative.   Cardiovascular: Negative.   Gastrointestinal: Negative.   Genitourinary: Negative.   Musculoskeletal: Positive for back pain and myalgias.  Skin: Negative.   Neurological: Negative.   Psychiatric/Behavioral: Negative.      Physical Exam BP 118/74   Ht 5\' 10"  (1.778 m)   Wt 246 lb (111.6 kg)   LMP 07/04/2016   BMI 35.30 kg/m  Patient's last menstrual period was 07/04/2016. Physical Exam  Constitutional: She is oriented to person, place, and time. She appears well-developed and well-nourished. No distress.  HENT:  Head: Normocephalic and atraumatic.  No nuchal rigidity. Is able to touch her chin to her chest.   Eyes: EOM are normal. No scleral icterus.  Neck: Normal range of motion. Neck supple.  Cardiovascular: Normal rate and regular rhythm.  Exam reveals no gallop and no friction rub.   No murmur heard. Pulmonary/Chest: Effort normal and breath sounds normal. No respiratory distress. She has no wheezes. She has no rales.  Abdominal: Soft. Bowel sounds are normal. She exhibits no distension and no mass. There is no tenderness. There is no rebound and  no guarding.  Musculoskeletal: Normal range of motion. She exhibits no edema.  Neurological: She is alert and oriented to person, place, and time. No cranial nerve deficit.  Skin: Skin is warm and dry. No erythema.  Psychiatric: She has a normal mood and affect. Her behavior is normal. Judgment normal.    Female chaperone present for pelvic and breast  portions of the physical exam  Assessment: 31 y.o. G72P1001 female with menorrhagia with irregular cycle (abnormal uterine bleeding) and dysmenorrhea.   Plan: Problem List Items Addressed This Visit    Dysmenorrhea   Relevant Medications    levonorgestrel-ethinyl estradiol (SEASONALE,INTROVALE,JOLESSA) 0.15-0.03 MG tablet   Menorrhagia with irregular cycle - Primary   Relevant Medications   levonorgestrel-ethinyl estradiol (SEASONALE,INTROVALE,JOLESSA) 0.15-0.03 MG tablet     This patient is well-known to this practice and me particularly. I believe a trial of seasonale is reasonable given her overall symptoms. Her body aches may or may not be related to her menses, but the timing sounds like it correlates. She states her symptoms are slightly different than when she was seen by another practice and was thought to have endometriosis.  Her symptoms do not seem to correlate well to endometriosis today.  But, Seasonale will treat those symptoms, as well.   Follow up in 6 months. If doing well at that time, will renew prescription.   20 minutes spent in face to face discussion with > 50% spent in counseling and management of her menorrhagia with irregular cycle and dysmenorrhea.   Thomasene Mohair, MD 07/22/2016 10:58 AM

## 2016-08-11 ENCOUNTER — Encounter: Payer: Self-pay | Admitting: Emergency Medicine

## 2016-08-11 ENCOUNTER — Emergency Department
Admission: EM | Admit: 2016-08-11 | Discharge: 2016-08-11 | Disposition: A | Discharge status: Home / Self Care | Payer: Commercial Managed Care - PPO | Attending: Emergency Medicine | Admitting: Emergency Medicine

## 2016-08-11 DIAGNOSIS — Z793 Long term (current) use of hormonal contraceptives: Secondary | ICD-10-CM | POA: Insufficient documentation

## 2016-08-11 DIAGNOSIS — N390 Urinary tract infection, site not specified: Secondary | ICD-10-CM

## 2016-08-11 DIAGNOSIS — R300 Dysuria: Secondary | ICD-10-CM | POA: Diagnosis present

## 2016-08-11 DIAGNOSIS — F17200 Nicotine dependence, unspecified, uncomplicated: Secondary | ICD-10-CM | POA: Diagnosis not present

## 2016-08-11 LAB — URINALYSIS, ROUTINE W REFLEX MICROSCOPIC
BACTERIA UA: NONE SEEN
Bilirubin Urine: NEGATIVE
GLUCOSE, UA: NEGATIVE mg/dL
Ketones, ur: NEGATIVE mg/dL
Nitrite: NEGATIVE
PH: 6 (ref 5.0–8.0)
PROTEIN: 100 mg/dL — AB
Specific Gravity, Urine: 1.023 (ref 1.005–1.030)

## 2016-08-11 LAB — POCT PREGNANCY, URINE: Preg Test, Ur: NEGATIVE

## 2016-08-11 MED ORDER — CIPROFLOXACIN HCL 500 MG PO TABS: 500.0000 mg | ORAL_TABLET | Freq: Two times a day (BID) | ORAL | 0 refills | Status: DC

## 2016-08-11 MED ORDER — DEXTROSE 5 % IV SOLN
1.0000 g | Freq: Once | INTRAVENOUS | Status: AC
  Administered 2016-08-11: 1 g via INTRAVENOUS
  Filled 2016-08-11: qty 10

## 2016-08-11 MED ORDER — ONDANSETRON 4 MG PO TBDP: 4.0000 mg | ORAL_TABLET | Freq: Three times a day (TID) | ORAL | 0 refills | Status: DC | PRN

## 2016-08-11 MED ORDER — HYDROCODONE-ACETAMINOPHEN 5-325 MG PO TABS: 1.0000 | ORAL_TABLET | ORAL | 0 refills | Status: DC | PRN

## 2016-08-11 MED ORDER — ONDANSETRON 4 MG PO TBDP
4.0000 mg | ORAL_TABLET | Freq: Once | ORAL | Status: AC
  Administered 2016-08-11: 4 mg via ORAL
  Filled 2016-08-11: qty 1

## 2016-08-11 NOTE — Discharge Instructions (Signed)
Increase fluids and begin taking antibiotics (Cipro) as directed. Take Norco as needed for pain. Zofran if needed for nausea. Return to the emergency room if any severe worsening of her symptoms such as high fever, vomiting, or inability to take antibiotic.

## 2016-08-11 NOTE — ED Provider Notes (Signed)
[facility] Emergency Department Provider Note  ____________________________________________   First MD Initiated Contact with Patient 08/11/16 1708     (approximate)  I have reviewed the triage vital signs and the nursing notes.   HISTORY  Chief Complaint Dysuria and Generalized Body Aches    HPI Sandra Castaneda is a 31 y.o. female has been experiencing generalized bodyaches for approximately 2 weeks. She was reassured at the clinic that she did not have the flu. She denies any fever. Today she states that there is some nausea without vomiting and generalized body aches. She is also been experiencing dysuria and frequency. She is unaware of any fever.  Patient was seen at [facility]" at [facility] yesterday and was told to go immediately to the emergency room for the possibility of a kidney stone or urinary tract infection. She was not placed on any antibiotics. She rates her pain as 7 out of 10.   Past Medical History:  Diagnosis Date  . Ovarian cyst 2007    Patient Active Problem List   Diagnosis Date Noted  . Dysmenorrhea 07/22/2016  . Menorrhagia with irregular cycle 07/22/2016    Past Surgical History:  Procedure Laterality Date  . CESAREAN SECTION  2008   FTP  . CESAREAN SECTION WITH BILATERAL TUBAL LIGATION N/A 02/04/2016   Procedure: CESAREAN SECTION WITH BILATERAL TUBAL LIGATION;  Surgeon: Conard Novak, MD;  Location: [facility] ORS;  Service: Obstetrics;  Laterality: N/A;  . LAPAROTOMY  2007    Prior to Admission medications   Medication Sig Start Date End Date Taking? Authorizing Provider  ciprofloxacin (CIPRO) 500 MG tablet Take 1 tablet (500 mg total) by mouth 2 (two) times daily. 08/11/16   Tommi Rumps, PA-C  HYDROcodone-acetaminophen (NORCO/VICODIN) 5-325 MG tablet Take 1 tablet by mouth every 4 (four) hours as needed for moderate pain. 08/11/16   Tommi Rumps, PA-C  levonorgestrel-ethinyl estradiol  (SEASONALE,INTROVALE,JOLESSA) 0.15-0.03 MG tablet Take 1 tablet by mouth daily. 07/22/16   Conard Novak, MD  ondansetron (ZOFRAN ODT) 4 MG disintegrating tablet Take 1 tablet (4 mg total) by mouth every 8 (eight) hours as needed for nausea or vomiting. 08/11/16   Tommi Rumps, PA-C    Allergies Patient has no known allergies.  Family History  Problem Relation Age of Onset  . Brain cancer Mother   . Hypertension Mother   . Ovarian cancer Mother   . Lung cancer Mother   . Diabetes Mellitus I Father   . Hypertension Brother   . Ovarian cancer Maternal Aunt   . Diabetes Mellitus II Paternal Aunt   . Diabetes Mellitus II Paternal Grandmother     Social History Social History  Substance Use Topics  . Smoking status: Current Every Day Smoker    Packs/day: 0.25  . Smokeless tobacco: Never Used  . Alcohol use No    Review of Systems Constitutional: No fever/chills ENT: No sore throat. Cardiovascular: Denies chest pain. Respiratory: Denies shortness of breath. Gastrointestinal: Generalized abdominal pain.  Positive nausea, no vomiting.   Genitourinary: Positive for urinary frequency. Musculoskeletal: Positive for back pain. Skin: Negative for rash. Neurological: Negative for headaches, focal weakness or numbness.   ____________________________________________   PHYSICAL EXAM:  VITAL SIGNS: ED Triage Vitals  Enc Vitals Group     BP 08/11/16 1703 (!) 141/78     Pulse Rate 08/11/16 1703 91     Resp 08/11/16 1703 18     Temp 08/11/16 1703 98.4 F (36.9  C)     Temp Source 08/11/16 1703 Oral     SpO2 08/11/16 1703 99 %     Weight 08/11/16 1700 240 lb (108.9 kg)     Height 08/11/16 1700 5\' 10"  (1.778 m)     Head Circumference --      Peak Flow --      Pain Score 08/11/16 1700 7     Pain Loc --      Pain Edu? --      Excl. in GC? --     Constitutional: Alert and oriented. Well appearing and in no acute distress. Eyes: Conjunctivae are normal. PERRL.  EOMI. Head: Atraumatic. Neck: No stridor.   Cardiovascular: Normal rate, regular rhythm. Grossly normal heart sounds.  Good peripheral circulation. Respiratory: Normal respiratory effort.  No retractions. Lungs CTAB. Gastrointestinal: Soft and nontender. No distention.  Questionable right CVA tenderness. Musculoskeletal: Moves upper and lower extremities without any difficulty. Normal gait was noted. On examination of the back there is no gross deformity noted. Neurologic:  Normal speech and language. No gross focal neurologic deficits are appreciated. No gait instability. Skin:  Skin is warm, dry and intact. No rash noted. Psychiatric: Mood and affect are normal. Speech and behavior are normal.  ____________________________________________   LABS (all labs ordered are listed, but only abnormal results are displayed)  Labs Reviewed  URINALYSIS, ROUTINE W REFLEX MICROSCOPIC - Abnormal; Notable for the following:       Result Value   Color, Urine YELLOW (*)    APPearance CLOUDY (*)    Hgb urine dipstick LARGE (*)    Protein, ur 100 (*)    Leukocytes, UA MODERATE (*)    Squamous Epithelial / LPF 6-30 (*)    All other components within normal limits  URINE CULTURE  POC URINE PREG, ED  POCT PREGNANCY, URINE      PROCEDURES  Procedure(s) performed: None  Procedures  Critical Care performed: No  ____________________________________________   INITIAL IMPRESSION / ASSESSMENT AND PLAN / ED COURSE  Pertinent labs & imaging results that were available during my care of the patient were reviewed by me and considered in my medical decision making (see chart for details).  Patient was given Zofran 4 mg ODT with resolution of her nausea. We discussed results of her urinalysis. We agreed that we would give first dose of antibiotics IV. Patient was given Rocephin 1 g in the department. She was discharged with prescription for Cipro 500 mg twice a day, Zofran if needed for nausea, and  Norco if needed for pain. We discussed returning to the emergency room if any severe worsening of her symptoms, vomiting or high fever. Patient was afebrile and no vomiting while in the department. Patient drove herself to the emergency room and is improved prior to discharge.   ____________________________________________   FINAL CLINICAL IMPRESSION(S) / ED DIAGNOSES  Final diagnoses:  Acute urinary tract infection      NEW MEDICATIONS STARTED DURING THIS VISIT:  New Prescriptions   CIPROFLOXACIN (CIPRO) 500 MG TABLET    Take 1 tablet (500 mg total) by mouth 2 (two) times daily.   HYDROCODONE-ACETAMINOPHEN (NORCO/VICODIN) 5-325 MG TABLET    Take 1 tablet by mouth every 4 (four) hours as needed for moderate pain.   ONDANSETRON (ZOFRAN ODT) 4 MG DISINTEGRATING TABLET    Take 1 tablet (4 mg total) by mouth every 8 (eight) hours as needed for nausea or vomiting.     Note:  This document was prepared  using Conservation officer, historic buildingsDragon voice recognition software and may include unintentional dictation errors.    Tommi RumpsSummers, Shenay Torti L, PA-C 08/11/16 1849    Tommi RumpsSummers, Iyauna Sing L, PA-C 08/11/16 1851    Jeanmarie PlantMcShane, James A, MD 08/11/16 1919

## 2016-08-11 NOTE — ED Triage Notes (Signed)
Presents with generalized body aches for about 2 weeks  Denies any fever  Then developed right flank pain and dysuria couple of days ago

## 2016-08-11 NOTE — ED Notes (Signed)
Also states she developed some n/v over the past couple of days  States she has vomited about 2 times daily

## 2016-08-14 LAB — URINE CULTURE: Special Requests: NORMAL

## 2016-08-26 DIAGNOSIS — F419 Anxiety disorder, unspecified: Secondary | ICD-10-CM | POA: Insufficient documentation

## 2016-09-08 DIAGNOSIS — Z720 Tobacco use: Secondary | ICD-10-CM | POA: Insufficient documentation

## 2016-09-08 DIAGNOSIS — R3129 Other microscopic hematuria: Secondary | ICD-10-CM | POA: Insufficient documentation

## 2016-09-08 DIAGNOSIS — E0590 Thyrotoxicosis, unspecified without thyrotoxic crisis or storm: Secondary | ICD-10-CM | POA: Insufficient documentation

## 2016-09-08 DIAGNOSIS — Z6835 Body mass index (BMI) 35.0-35.9, adult: Secondary | ICD-10-CM | POA: Insufficient documentation

## 2017-06-12 ENCOUNTER — Other Ambulatory Visit: Payer: Self-pay

## 2017-06-12 ENCOUNTER — Emergency Department
Admission: EM | Admit: 2017-06-12 | Discharge: 2017-06-13 | Disposition: A | Discharge status: Other Acute Inpatient Hospital | Payer: Commercial Managed Care - PPO | Attending: Emergency Medicine | Admitting: Emergency Medicine

## 2017-06-12 ENCOUNTER — Encounter: Payer: Self-pay | Admitting: Emergency Medicine

## 2017-06-12 DIAGNOSIS — F329 Major depressive disorder, single episode, unspecified: Secondary | ICD-10-CM | POA: Insufficient documentation

## 2017-06-12 DIAGNOSIS — R45851 Suicidal ideations: Secondary | ICD-10-CM | POA: Diagnosis not present

## 2017-06-12 DIAGNOSIS — F17200 Nicotine dependence, unspecified, uncomplicated: Secondary | ICD-10-CM | POA: Diagnosis not present

## 2017-06-12 DIAGNOSIS — Z046 Encounter for general psychiatric examination, requested by authority: Secondary | ICD-10-CM | POA: Diagnosis not present

## 2017-06-12 DIAGNOSIS — Z79899 Other long term (current) drug therapy: Secondary | ICD-10-CM | POA: Diagnosis not present

## 2017-06-12 DIAGNOSIS — Z7289 Other problems related to lifestyle: Secondary | ICD-10-CM | POA: Diagnosis not present

## 2017-06-12 DIAGNOSIS — T39312A Poisoning by propionic acid derivatives, intentional self-harm, initial encounter: Secondary | ICD-10-CM | POA: Diagnosis not present

## 2017-06-12 DIAGNOSIS — T50902A Poisoning by unspecified drugs, medicaments and biological substances, intentional self-harm, initial encounter: Secondary | ICD-10-CM

## 2017-06-12 DIAGNOSIS — F32A Depression, unspecified: Secondary | ICD-10-CM

## 2017-06-12 LAB — COMPREHENSIVE METABOLIC PANEL
ALBUMIN: 3.9 g/dL (ref 3.5–5.0)
ALBUMIN: 3.8 g/dL (ref 3.5–5.0)
ALK PHOS: 69 U/L (ref 38–126)
ALT: 15 U/L (ref 14–54)
ALT: 12 U/L — ABNORMAL LOW (ref 14–54)
ALT: 12 U/L — ABNORMAL LOW (ref 14–54)
ANION GAP: 7 (ref 5–15)
AST: 14 U/L — ABNORMAL LOW (ref 15–41)
AST: 17 U/L (ref 15–41)
AST: 14 U/L — AB (ref 15–41)
Albumin: 4.4 g/dL (ref 3.5–5.0)
Alkaline Phosphatase: 59 U/L (ref 38–126)
Alkaline Phosphatase: 57 U/L (ref 38–126)
Anion gap: 10 (ref 5–15)
Anion gap: 6 (ref 5–15)
BILIRUBIN TOTAL: 5.1 mg/dL — AB (ref 0.3–1.2)
BUN: 9 mg/dL (ref 6–20)
BUN: 7 mg/dL (ref 6–20)
BUN: 9 mg/dL (ref 6–20)
CALCIUM: 8.7 mg/dL — AB (ref 8.9–10.3)
CALCIUM: 8.4 mg/dL — AB (ref 8.9–10.3)
CHLORIDE: 107 mmol/L (ref 101–111)
CHLORIDE: 111 mmol/L (ref 101–111)
CO2: 20 mmol/L — AB (ref 22–32)
CO2: 23 mmol/L (ref 22–32)
CO2: 22 mmol/L (ref 22–32)
CREATININE: 0.55 mg/dL (ref 0.44–1.00)
Calcium: 8.2 mg/dL — ABNORMAL LOW (ref 8.9–10.3)
Chloride: 111 mmol/L (ref 101–111)
Creatinine, Ser: 0.61 mg/dL (ref 0.44–1.00)
Creatinine, Ser: 0.56 mg/dL (ref 0.44–1.00)
GFR calc Af Amer: >60 mL/min (ref >60)
GFR calc non Af Amer: >60 mL/min (ref >60)
GFR calc non Af Amer: >60 mL/min (ref >60)
Glucose, Bld: 104 mg/dL — ABNORMAL HIGH (ref 65–99)
Glucose, Bld: 79 mg/dL (ref 65–99)
Glucose, Bld: 111 mg/dL — ABNORMAL HIGH (ref 65–99)
POTASSIUM: 3.9 mmol/L (ref 3.5–5.1)
Potassium: 3.5 mmol/L (ref 3.5–5.1)
Potassium: 3.5 mmol/L (ref 3.5–5.1)
SODIUM: 139 mmol/L (ref 135–145)
Sodium: 137 mmol/L (ref 135–145)
Sodium: 141 mmol/L (ref 135–145)
Total Bilirubin: 2 mg/dL — ABNORMAL HIGH (ref 0.3–1.2)
Total Bilirubin: 2.7 mg/dL — ABNORMAL HIGH (ref 0.3–1.2)
Total Protein: 7.9 g/dL (ref 6.5–8.1)
Total Protein: 7.1 g/dL (ref 6.5–8.1)
Total Protein: 6.8 g/dL (ref 6.5–8.1)

## 2017-06-12 LAB — URINE DRUG SCREEN, QUALITATIVE (ARMC ONLY)
Amphetamines, Ur Screen: NOT DETECTED
BENZODIAZEPINE, UR SCRN: NOT DETECTED
Barbiturates, Ur Screen: NOT DETECTED
CANNABINOID 50 NG, UR ~~LOC~~: NOT DETECTED
Cocaine Metabolite,Ur ~~LOC~~: NOT DETECTED
MDMA (Ecstasy)Ur Screen: NOT DETECTED
Methadone Scn, Ur: NOT DETECTED
OPIATE, UR SCREEN: NOT DETECTED
PHENCYCLIDINE (PCP) UR S: NOT DETECTED
Tricyclic, Ur Screen: NOT DETECTED

## 2017-06-12 LAB — ETHANOL: Alcohol, Ethyl (B): 71 mg/dL — ABNORMAL HIGH (ref <10)

## 2017-06-12 LAB — POCT PREGNANCY, URINE: PREG TEST UR: NEGATIVE

## 2017-06-12 LAB — ACETAMINOPHEN LEVEL
Acetaminophen (Tylenol), Serum: <10 ug/mL — ABNORMAL LOW (ref 10–30)
Acetaminophen (Tylenol), Serum: <10 ug/mL — ABNORMAL LOW (ref 10–30)

## 2017-06-12 LAB — CBC
HCT: 38.8 % (ref 35.0–47.0)
HEMOGLOBIN: 13.6 g/dL (ref 12.0–16.0)
MCH: 31.3 pg (ref 26.0–34.0)
MCHC: 35 g/dL (ref 32.0–36.0)
MCV: 89.5 fL (ref 80.0–100.0)
Platelets: 335 10*3/uL (ref 150–440)
RBC: 4.33 MIL/uL (ref 3.80–5.20)
RDW: 13.1 % (ref 11.5–14.5)
WBC: 10.8 10*3/uL (ref 3.6–11.0)

## 2017-06-12 LAB — SALICYLATE LEVEL: Salicylate Lvl: <7 mg/dL (ref 2.8–30.0)

## 2017-06-12 MED ORDER — SODIUM CHLORIDE 0.9 % IV BOLUS
1000.0000 mL | Freq: Once | INTRAVENOUS | Status: DC
  Administered 2017-06-12: 1000 mL via INTRAVENOUS

## 2017-06-12 MED ORDER — SODIUM CHLORIDE 0.9 % IV BOLUS: 1000.0000 mL | Freq: Once | INTRAVENOUS | Status: DC
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
## 2017-06-12 NOTE — ED Provider Notes (Signed)
-----------------------------------------   9:27 PM on 06/12/2017 -----------------------------------------   Blood pressure (!) 107/55, pulse 73, temperature 98.5 F (36.9 C), temperature source Oral, resp. rate (!) 24, height  (1.803 m), weight 99.8 kg (220 lb), last menstrual period 05/17/2017, SpO2 100 %, unknown if currently breastfeeding.  Patient with bilirubin now trending downward.  Likely from Naprosyn overdose.  Patient now saying that she is concerned about losing her job.  She is concerned that her brother may lose her job because he is taking care of her children at this time.  She is requesting to be discharged at home and says that she does have "a moment" earlier in the day when she overdosed.  Psychiatry had recommended previously that she be committed.  I agree with psychiatry that this was a very concerning event.  I told the patient that I would not be reversing her involuntary commitment that I can re-consult psychiatry.  I also discussed with her that we can give her phone to contact her brother to arrange childcare.  However, I do not feel it is safe to discharge her at this time.    Myrna Blazer, MD 06/12/17 [id number]

## 2017-06-12 NOTE — ED Notes (Signed)
BEHAVIORAL HEALTH ROUNDING Patient sleeping: No. Patient alert and oriented: yes Behavior appropriate: Yes.  ; If no, describe:  Nutrition and fluids offered: Yes  Toileting and hygiene offered: Yes  Sitter present: not applicable Law enforcement present: Yes  

## 2017-06-12 NOTE — ED Notes (Signed)
durin g interview patient states took naproxen 40 220 mg and drank 1/2 gallon of wine 1 hour ago

## 2017-06-12 NOTE — ED Notes (Signed)

## 2017-06-12 NOTE — ED Notes (Signed)
Poison control contacted, orders received.

## 2017-06-12 NOTE — ED Notes (Signed)
ED BHU PLACEMENT JUSTIFICATION Is the patient under IVC or is there intent for IVC: Yes.   Is the patient medically cleared: Yes.   Is there vacancy in the ED BHU: Yes.   Is the population mix appropriate for patient: Yes.   Is the patient awaiting placement in inpatient or outpatient setting: Yes.   Has the patient had a psychiatric consult: Yes.   Survey of unit performed for contraband, proper placement and condition of furniture, tampering with fixtures in bathroom, shower, and each patient room: Yes.   APPEARANCE/BEHAVIOR adequate rapport can be established NEURO ASSESSMENT Orientation: time, place and person Hallucinations: No.None noted (Hallucinations) Speech: Normal Gait: normal RESPIRATORY ASSESSMENT Normal expansion.  Clear to auscultation.  No rales, rhonchi, or wheezing. CARDIOVASCULAR ASSESSMENT regular rate and rhythm, S1, S2 normal, no murmur, click, rub or gallop GASTROINTESTINAL ASSESSMENT soft, nontender, BS WNL, no r/g EXTREMITIES normal strength, tone, and muscle mass PLAN OF CARE Provide calm/safe environment. Vital signs assessed twice daily. ED BHU Assessment once each 12-hour shift. Collaborate with intake RN daily or as condition indicates. Assure the ED provider has rounded once each shift. Provide and encourage hygiene. Provide redirection as needed. Assess for escalating behavior; address immediately and inform ED provider.  Assess family dynamic and appropriateness for visitation as needed: Yes.   Educate the patient/family about BHU procedures/visitation: Yes.   

## 2017-06-12 NOTE — ED Triage Notes (Addendum)
Pt arrives vai ems from home, pt reports hx of some depression and meds didn't work so she stopped taking it, pt had an argument this am with significant other and took 49 tabs of  naproxen, and a half a gallon of wine this am. Pt is tearful at this time, pt is talking to [employer] officer at this time, pt is very thankful for all the help she has received this am. Pt does report some headache as well as nausea

## 2017-06-12 NOTE — ED Notes (Signed)
States children are now with her brother. States he also is a safe caregiver. Upset she is not being released from IVC. Asks what would happen if she tried to walk out. Educated she would be detained. No attempts to leave.

## 2017-06-12 NOTE — ED Notes (Signed)
Chart reviewed with Romeo Apple from poison control. States they attribute elevated bilirubin as false positive due to NSAID ingestion and that patient is cleared from their standpoint at this time.

## 2017-06-12 NOTE — BH Assessment (Signed)
Referrals sent to the following:   . [facility]  [address] [place], [place] [address]  [phone]  . [facility] [address] [place], [place] [address]  [phone]  . [facility]   [address], [place] [address]  [phone]  . [facility]   [address] [place] [address]  [phone]  . [facility]   [address], [place] [address]  [phone]  . [facility]  [address] [place] [address]  [phone]  . [facility]  [address] [place] [address]  [phone]

## 2017-06-12 NOTE — ED Notes (Signed)
BEHAVIORAL HEALTH ROUNDING Patient sleeping: Yes.   Patient alert and oriented: yes Behavior appropriate: Yes.  ; If no, describe:  Nutrition and fluids offered: Yes  Toileting and hygiene offered: Yes  Sitter present: not applicable Law enforcement present: Yes  

## 2017-06-13 NOTE — ED Notes (Signed)
Patient alert and oriented. Patient discharged to [facility]. Report given to [facility]. Patient aware of admission and is tearful and upset. Patient provided support. Patient left ambulatory with [facility] dept. All patient belongings returned to her and given to transporting officer. Patient vitals 97.5-125/75-75-20-100% room air.

## 2017-06-13 NOTE — ED Notes (Signed)
emtala reviewed by this RN 

## 2017-06-13 NOTE — ED Provider Notes (Signed)
-----------------------------------------   2:10 AM on 06/13/2017 -----------------------------------------   Blood pressure 134/74, pulse 79, temperature 98.5 F (36.9 C), temperature source Oral, resp. rate 20, height  (1.803 m), weight 99.8 kg (220 lb), last menstrual period 05/17/2017, SpO2 100 %, unknown if currently breastfeeding.  The patient had no acute events since last update.  Calm and cooperative at this time.  Disposition is pending Psychiatry/Behavioral Medicine team recommendations.     Merrily Brittle, MD 06/13/17 [phone]

## 2017-06-13 NOTE — BH Assessment (Signed)
Patient has been accepted to [facility].  Patient assigned to room 351-B Accepting physician is Dr. Betti Cruz.  Call report to [phone].  Representative was [employer].  Can arrive after 8am  ER Staff is aware of it:  [facility]  Dr. Lamont Snowball, ER MD  Claris Che Patient's Nurse

## 2017-06-13 NOTE — ED Notes (Signed)
Patient given breakfast meal. 

## 2017-06-13 NOTE — ED Notes (Signed)
Patient alert and oriented. Patient is tearful. Patient made aware of admission inpatient. Patient expressed desire to go home and do outpatient treatment. Patient provided support and encouragement. Patient denies SI/HI and A/V hallucinations this am. Q 15 minute checks in progress and patient remains safe on unit.

## 2017-06-13 NOTE — ED Provider Notes (Signed)
-----------------------------------------   8:37 AM on 06/13/2017 -----------------------------------------  Patient has been accepted to [facility].  Patient's medical work-up is been largely nonrevealing.  Acetaminophen level remain normal urine toxicology is negative.  Alcohol level minimally elevated upon arrival at 71.   Minna Antis, MD 06/13/17 [phone]

## 2017-08-05 DIAGNOSIS — F332 Major depressive disorder, recurrent severe without psychotic features: Secondary | ICD-10-CM | POA: Insufficient documentation

## 2017-08-05 DIAGNOSIS — R519 Headache, unspecified: Secondary | ICD-10-CM | POA: Insufficient documentation

## 2018-07-27 DIAGNOSIS — R0789 Other chest pain: Secondary | ICD-10-CM | POA: Diagnosis not present

## 2018-07-27 DIAGNOSIS — Z20828 Contact with and (suspected) exposure to other viral communicable diseases: Secondary | ICD-10-CM | POA: Insufficient documentation

## 2018-07-27 DIAGNOSIS — R5383 Other fatigue: Secondary | ICD-10-CM | POA: Diagnosis not present

## 2018-07-27 DIAGNOSIS — R0602 Shortness of breath: Secondary | ICD-10-CM | POA: Diagnosis not present

## 2018-09-19 ENCOUNTER — Encounter: Payer: Self-pay | Admitting: Obstetrics and Gynecology

## 2018-09-19 ENCOUNTER — Other Ambulatory Visit: Payer: Self-pay

## 2018-09-19 ENCOUNTER — Ambulatory Visit (INDEPENDENT_AMBULATORY_CARE_PROVIDER_SITE_OTHER): Payer: 59 | Admitting: Obstetrics and Gynecology

## 2018-09-19 ENCOUNTER — Other Ambulatory Visit (HOSPITAL_COMMUNITY)
Admission: RE | Admit: 2018-09-19 | Discharge: 2018-09-19 | Disposition: A | Discharge status: Home / Self Care | Payer: 59 | Source: Ambulatory Visit | Attending: Obstetrics and Gynecology | Admitting: Obstetrics and Gynecology

## 2018-09-19 VITALS — BP 122/84 | Ht 71.0 in | Wt 249.0 lb

## 2018-09-19 DIAGNOSIS — Z30011 Encounter for initial prescription of contraceptive pills: Secondary | ICD-10-CM

## 2018-09-19 DIAGNOSIS — Z01419 Encounter for gynecological examination (general) (routine) without abnormal findings: Secondary | ICD-10-CM

## 2018-09-19 DIAGNOSIS — Z1331 Encounter for screening for depression: Secondary | ICD-10-CM

## 2018-09-19 DIAGNOSIS — Z1339 Encounter for screening examination for other mental health and behavioral disorders: Secondary | ICD-10-CM

## 2018-09-19 DIAGNOSIS — Z124 Encounter for screening for malignant neoplasm of cervix: Secondary | ICD-10-CM

## 2018-09-19 DIAGNOSIS — Z113 Encounter for screening for infections with a predominantly sexual mode of transmission: Secondary | ICD-10-CM

## 2018-09-19 DIAGNOSIS — N946 Dysmenorrhea, unspecified: Secondary | ICD-10-CM

## 2018-09-19 DIAGNOSIS — N921 Excessive and frequent menstruation with irregular cycle: Secondary | ICD-10-CM

## 2018-09-19 MED ORDER — LEVONORGEST-ETH ESTRAD 91-DAY 0.15-0.03 MG PO TABS: 1.0000 | ORAL_TABLET | Freq: Every day | ORAL | 3 refills | Status: DC

## 2018-09-19 NOTE — Progress Notes (Signed)
Gynecology Annual Exam  PCP: Patient, No Pcp Per  Chief Complaint  Patient presents with  . Annual Exam   History of Present Illness:  [name] is a 33 y.o. [id number] who LMP was Patient's last menstrual period was 08/29/2018., presents today for her annual examination.  Her menses are regular every 28-30 days, lasting 3 day(s).  Dysmenorrhea severe, occurring throughout menses. She does not have intermenstrual bleeding.  She is not sexually active.  Last Pap: several years  Results were: no abnormalities /neg HPV DNA n/a Hx of STDs: chlamydia  Last mammogram: n/a There is no FH of breast cancer. There is no FH of ovarian cancer. The patient does not do self-breast exams.  Tobacco use: smokes 0.25 packs per day. Alcohol use: social drinker Exercise: started walking since she works from home  The patient wears seatbelts: yes.      Past Medical History:  Diagnosis Date  . Ovarian cyst 2007    Past Surgical History:  Procedure Laterality Date  . CESAREAN SECTION  2008   FTP  . CESAREAN SECTION WITH BILATERAL TUBAL LIGATION N/A 02/04/2016   Procedure: CESAREAN SECTION WITH BILATERAL TUBAL LIGATION;  Surgeon: Conard NovakStephen D Tishawn Friedhoff, MD;  Location: [facility] ORS;  Service: Obstetrics;  Laterality: N/A;  . LAPAROTOMY  2007    Prior to Admission medications   Medication Sig Start Date End Date Taking? Authorizing Provider  levonorgestrel-ethinyl estradiol (SEASONALE,INTROVALE,JOLESSA) 0.15-0.03 MG tablet Take 1 tablet by mouth daily. Patient not taking: Reported on 06/12/2017 07/22/16   Conard NovakJackson, Breely Panik D, MD   Allergies: No Known Allergies  Gynecologic History: Patient's last menstrual period was 08/29/2018.  Obstetric History: [id number]  Social History   Socioeconomic History  . Marital status: Single    Spouse name: Not on file  . Number of children: Not on file  . Years of education: Not on file  . Highest education level: Not on file  Occupational History  . Not on  file  Social Needs  . Financial resource strain: Not on file  . Food insecurity    Worry: Not on file    Inability: Not on file  . Transportation needs    Medical: Not on file    Non-medical: Not on file  Tobacco Use  . Smoking status: Current Every Day Smoker    Packs/day: 0.25  . Smokeless tobacco: Never Used  Substance and Sexual Activity  . Alcohol use: No  . Drug use: No  . Sexual activity: Yes    Birth control/protection: Surgical  Lifestyle  . Physical activity    Days per week: Not on file    Minutes per session: Not on file  . Stress: Not on file  Relationships  . Social Musicianconnections    Talks on phone: Not on file    Gets together: Not on file    Attends religious service: Not on file    Active member of club or organization: Not on file    Attends meetings of clubs or organizations: Not on file    Relationship status: Not on file  . Intimate partner violence    Fear of current or ex partner: Not on file    Emotionally abused: Not on file    Physically abused: Not on file    Forced sexual activity: Not on file  Other Topics Concern  . Not on file  Social History Narrative  . Not on file    Family History  Problem Relation Age of Onset  .  Brain cancer Mother   . Hypertension Mother   . Ovarian cancer Mother   . Lung cancer Mother   . Diabetes Mellitus I Father   . Hypertension Brother   . Ovarian cancer Maternal Aunt   . Diabetes Mellitus II Paternal Aunt   . Diabetes Mellitus II Paternal Grandmother     Review of Systems  Constitutional: Negative.   HENT: Negative.   Eyes: Negative.   Respiratory: Negative.   Cardiovascular: Negative.   Gastrointestinal: Positive for constipation. Negative for abdominal pain, blood in stool, diarrhea, heartburn, melena, nausea and vomiting.  Genitourinary: Negative.   Musculoskeletal: Negative.   Skin: Negative.   Neurological: Negative.   Psychiatric/Behavioral: Negative.      Physical Exam BP 122/84    Ht 5\' 11"  (1.803 m)   Wt 249 lb (112.9 kg)   LMP 08/29/2018   BMI 34.73 kg/m    Physical Exam Constitutional:      General: She is not in acute distress.    Appearance: Normal appearance. She is well-developed.  Genitourinary:     Pelvic exam was performed with patient supine.     Vulva, urethra, bladder and uterus normal.     No inguinal adenopathy present in the right or left side.    No signs of injury in the vagina.     No vaginal discharge, erythema, tenderness or bleeding.     No cervical motion tenderness, discharge, lesion or polyp.     Uterus is mobile.     Uterus is not enlarged or tender.     No uterine mass detected.    Uterus is anteverted.     No right or left adnexal mass present.     Right adnexa not tender or full.     Left adnexa not tender or full.  HENT:     Head: Normocephalic and atraumatic.  Eyes:     General: No scleral icterus.    Conjunctiva/sclera: Conjunctivae normal.  Neck:     Musculoskeletal: Normal range of motion and neck supple.     Thyroid: No thyromegaly.  Cardiovascular:     Rate and Rhythm: Normal rate and regular rhythm.     Heart sounds: No murmur. No friction rub. No gallop.   Pulmonary:     Effort: Pulmonary effort is normal. No respiratory distress.     Breath sounds: Normal breath sounds. No wheezing or rales.  Chest:     Breasts:        Right: No inverted nipple, mass, nipple discharge, skin change or tenderness.        Left: No inverted nipple, mass, nipple discharge, skin change or tenderness.  Abdominal:     General: Bowel sounds are normal. There is no distension.     Palpations: Abdomen is soft. There is no mass.     Tenderness: There is no abdominal tenderness. There is no guarding or rebound.  Musculoskeletal: Normal range of motion.        General: No swelling or tenderness.  Lymphadenopathy:     Cervical: No cervical adenopathy.     Lower Body: No right inguinal adenopathy. No left inguinal adenopathy.   Neurological:     General: No focal deficit present.     Mental Status: She is alert and oriented to person, place, and time.     Cranial Nerves: No cranial nerve deficit.  Skin:    General: Skin is warm and dry.     Findings: No erythema or rash.  Psychiatric:        Mood and Affect: Mood normal.        Behavior: Behavior normal.        Judgment: Judgment normal.   Female chaperone present for pelvic and breast  portions of the physical exam  Results: AUDIT Questionnaire (screen for alcoholism): 1 PHQ-9: 7  Assessment: 33 y.o. [id number] female here for routine annual gynecologic examination.  Plan: Problem List Items Addressed This Visit      Genitourinary   Dysmenorrhea   Relevant Medications   levonorgestrel-ethinyl estradiol (SEASONALE) 0.15-0.03 MG tablet     Other   Menorrhagia with irregular cycle   Relevant Medications   levonorgestrel-ethinyl estradiol (SEASONALE) 0.15-0.03 MG tablet    Other Visit Diagnoses    Women's annual routine gynecological examination    -  Primary   Relevant Medications   levonorgestrel-ethinyl estradiol (SEASONALE) 0.15-0.03 MG tablet   Other Relevant Orders   Cytology - PAP   Screening for depression       Screening for alcoholism       Pap smear for cervical cancer screening       Relevant Orders   Cytology - PAP   Screen for STD (sexually transmitted disease)       Relevant Orders   Cytology - PAP   Encounter for initial prescription of contraceptive pills       Relevant Medications   levonorgestrel-ethinyl estradiol (SEASONALE) 0.15-0.03 MG tablet      Screening: -- Blood pressure screen normal -- Colonoscopy - not due -- Mammogram - not due -- Weight screening: obese: discussed management options, including lifestyle, dietary, and exercise. -- Depression screening negative (PHQ-9) -- Nutrition: normal -- cholesterol screening: not due for screening -- osteoporosis screening: not due -- tobacco screening: using:  discussed quitting using the 5 A's -- alcohol screening: AUDIT questionnaire indicates low-risk usage. -- family history of breast cancer screening: done. not at high risk. -- no evidence of domestic violence or intimate partner violence. -- STD screening: gonorrhea/chlamydia NAAT collected -- pap smear collected per ASCCP guidelines -- HPV vaccination series: received  Family history of breast cancer and ovarian cancer: Tested through [facility] in the past.  She states her results were negative.  Tyrer-Cuz[place] today: lifetime risk of breast cancer is 25.3%.  Will discuss at follow up since she had already left when I received the results.   Contraceptive management/dysmenorrhea/menorrhagia: renew contraception.    Prentice Docker, MD 09/19/2018 5:19 PM

## 2018-09-22 LAB — CYTOLOGY - PAP
Chlamydia: NEGATIVE
Diagnosis: NEGATIVE
HPV: NOT DETECTED
Neisseria Gonorrhea: NEGATIVE

## 2018-09-29 ENCOUNTER — Telehealth: Payer: Self-pay | Admitting: Obstetrics and Gynecology

## 2018-09-29 ENCOUNTER — Other Ambulatory Visit: Payer: Self-pay | Admitting: Obstetrics and Gynecology

## 2018-09-29 DIAGNOSIS — A5901 Trichomonal vulvovaginitis: Secondary | ICD-10-CM

## 2018-09-29 MED ORDER — METRONIDAZOLE 500 MG PO TABS: 2000.0000 mg | ORAL_TABLET | Freq: Once | ORAL | 0 refills | Status: AC

## 2018-09-29 NOTE — Telephone Encounter (Signed)
Discussed finding of trichomonas on Pap smear.  She states that she is not currently sexually active.  She believes she got this infection from her last partner.  She was encouraged to let them know about this infection.  We will treat her according to the usual protocol.  She voiced understanding and agreement.

## 2018-10-25 ENCOUNTER — Encounter: Payer: Self-pay | Admitting: Obstetrics and Gynecology

## 2018-11-04 ENCOUNTER — Other Ambulatory Visit: Payer: Self-pay

## 2018-11-04 ENCOUNTER — Emergency Department: Payer: 59

## 2018-11-04 ENCOUNTER — Emergency Department
Admission: EM | Admit: 2018-11-04 | Discharge: 2018-11-05 | Disposition: A | Discharge status: Home / Self Care | Payer: 59 | Attending: Emergency Medicine | Admitting: Emergency Medicine

## 2018-11-04 DIAGNOSIS — M5441 Lumbago with sciatica, right side: Secondary | ICD-10-CM | POA: Insufficient documentation

## 2018-11-04 DIAGNOSIS — M5442 Lumbago with sciatica, left side: Secondary | ICD-10-CM | POA: Insufficient documentation

## 2018-11-04 DIAGNOSIS — R200 Anesthesia of skin: Secondary | ICD-10-CM | POA: Diagnosis not present

## 2018-11-04 DIAGNOSIS — Z3202 Encounter for pregnancy test, result negative: Secondary | ICD-10-CM | POA: Diagnosis not present

## 2018-11-04 DIAGNOSIS — Z79899 Other long term (current) drug therapy: Secondary | ICD-10-CM | POA: Diagnosis not present

## 2018-11-04 DIAGNOSIS — R102 Pelvic and perineal pain: Secondary | ICD-10-CM | POA: Insufficient documentation

## 2018-11-04 DIAGNOSIS — M549 Dorsalgia, unspecified: Secondary | ICD-10-CM | POA: Diagnosis not present

## 2018-11-04 DIAGNOSIS — N939 Abnormal uterine and vaginal bleeding, unspecified: Secondary | ICD-10-CM | POA: Diagnosis not present

## 2018-11-04 DIAGNOSIS — M546 Pain in thoracic spine: Secondary | ICD-10-CM | POA: Diagnosis not present

## 2018-11-04 DIAGNOSIS — M545 Low back pain: Secondary | ICD-10-CM | POA: Diagnosis not present

## 2018-11-04 DIAGNOSIS — F17210 Nicotine dependence, cigarettes, uncomplicated: Secondary | ICD-10-CM | POA: Insufficient documentation

## 2018-11-04 LAB — URINALYSIS, COMPLETE (UACMP) WITH MICROSCOPIC
Bilirubin Urine: NEGATIVE
Glucose, UA: NEGATIVE mg/dL
Ketones, ur: NEGATIVE mg/dL
Leukocytes,Ua: NEGATIVE
Nitrite: NEGATIVE
Protein, ur: 30 mg/dL — AB
RBC / HPF: >50 RBC/hpf — ABNORMAL HIGH (ref 0–5)
Specific Gravity, Urine: 1.028 (ref 1.005–1.030)
pH: 5 (ref 5.0–8.0)

## 2018-11-04 LAB — COMPREHENSIVE METABOLIC PANEL
ALT: 34 U/L (ref 0–44)
AST: 18 U/L (ref 15–41)
Albumin: 3.9 g/dL (ref 3.5–5.0)
Alkaline Phosphatase: 58 U/L (ref 38–126)
Anion gap: 9 (ref 5–15)
BUN: 8 mg/dL (ref 6–20)
CO2: 24 mmol/L (ref 22–32)
Calcium: 8.7 mg/dL — ABNORMAL LOW (ref 8.9–10.3)
Chloride: 105 mmol/L (ref 98–111)
Creatinine, Ser: 0.69 mg/dL (ref 0.44–1.00)
GFR calc Af Amer: >60 mL/min (ref >60)
GFR calc non Af Amer: >60 mL/min (ref >60)
Glucose, Bld: 90 mg/dL (ref 70–99)
Potassium: 3.3 mmol/L — ABNORMAL LOW (ref 3.5–5.1)
Sodium: 138 mmol/L (ref 135–145)
Total Bilirubin: 0.5 mg/dL (ref 0.3–1.2)
Total Protein: 7.5 g/dL (ref 6.5–8.1)

## 2018-11-04 LAB — CBC
HCT: 39.2 % (ref 36.0–46.0)
Hemoglobin: 13 g/dL (ref 12.0–15.0)
MCH: 29.9 pg (ref 26.0–34.0)
MCHC: 33.2 g/dL (ref 30.0–36.0)
MCV: 90.1 fL (ref 80.0–100.0)
Platelets: 299 10*3/uL (ref 150–400)
RBC: 4.35 MIL/uL (ref 3.87–5.11)
RDW: 13.5 % (ref 11.5–15.5)
WBC: 11.2 10*3/uL — ABNORMAL HIGH (ref 4.0–10.5)
nRBC: 0 % (ref 0.0–0.2)

## 2018-11-04 LAB — POCT PREGNANCY, URINE: Preg Test, Ur: NEGATIVE

## 2018-11-04 LAB — LIPASE, BLOOD: Lipase: 30 U/L (ref 11–51)

## 2018-11-04 IMAGING — US US PELVIS COMPLETE TRANSABD/TRANSVAG W DUPLEX
1 series · 13 of 25 positions shown · non-contrast
Comparison: [DATE] pelvic sonogram.

CLINICAL DATA: 33-year-old female presents with lower pelvic pain
for 6 hours. LMP [DATE].

EXAM:
TRANSABDOMINAL AND TRANSVAGINAL ULTRASOUND OF PELVIS
DOPPLER ULTRASOUND OF OVARIES
TECHNIQUE: Both transabdominal and transvaginal ultrasound examinations of the
pelvis were performed. Transabdominal technique was performed for
global imaging of the pelvis including uterus, ovaries, adnexal
regions, and pelvic cul-de-sac.
It was necessary to proceed with endovaginal exam following the
transabdominal exam to visualize the endometrium and adnexa. Color
and duplex Doppler ultrasound was utilized to evaluate blood flow to
the ovaries.

[Series 1: us pelvis complete transabd/transvag w duplex · 13 of 47 slices shown]
[im 1/47]
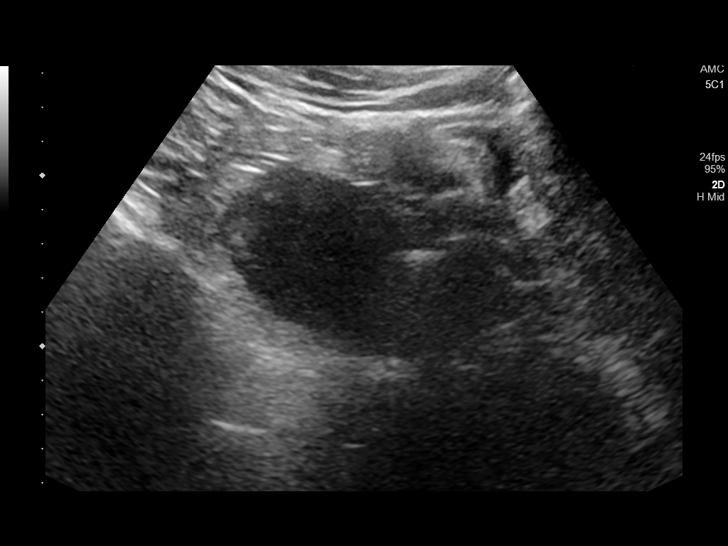
[im 4/47]
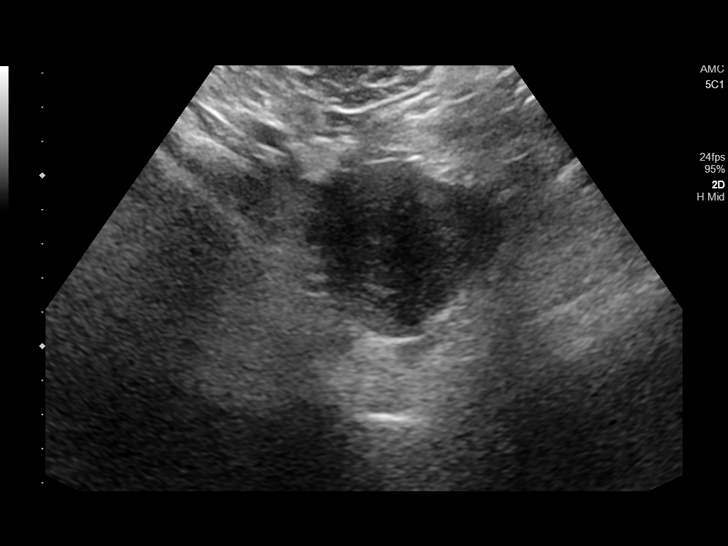
[im 8/47]
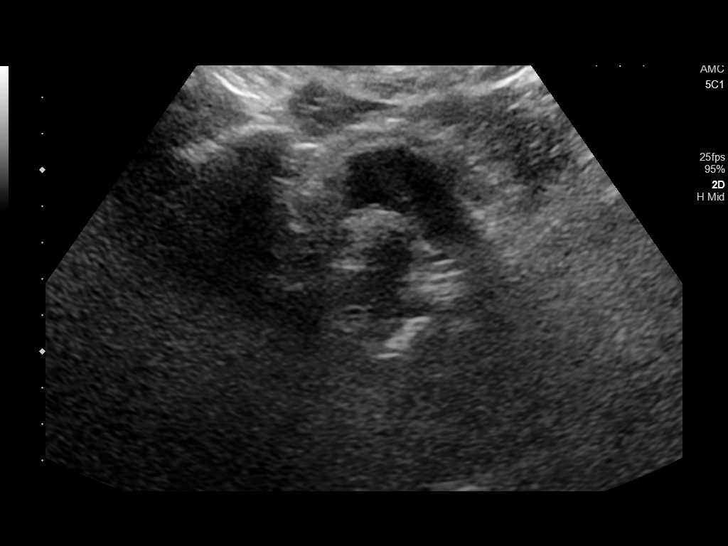
[im 12/47]
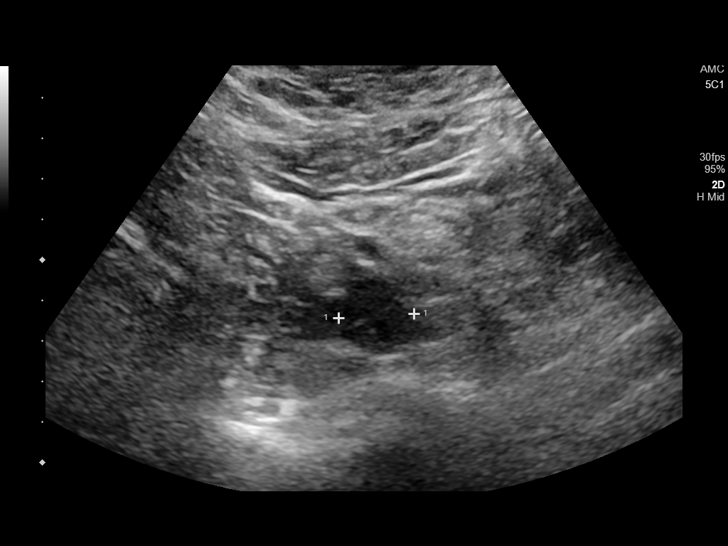
[im 16/47]
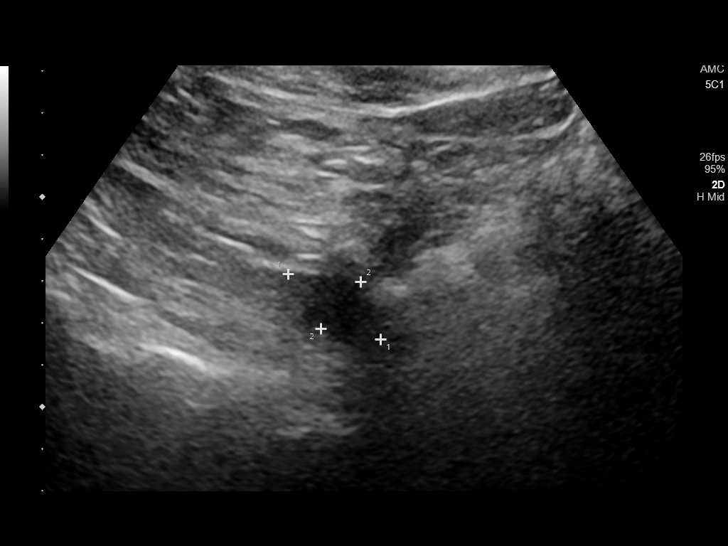
[im 20/47]
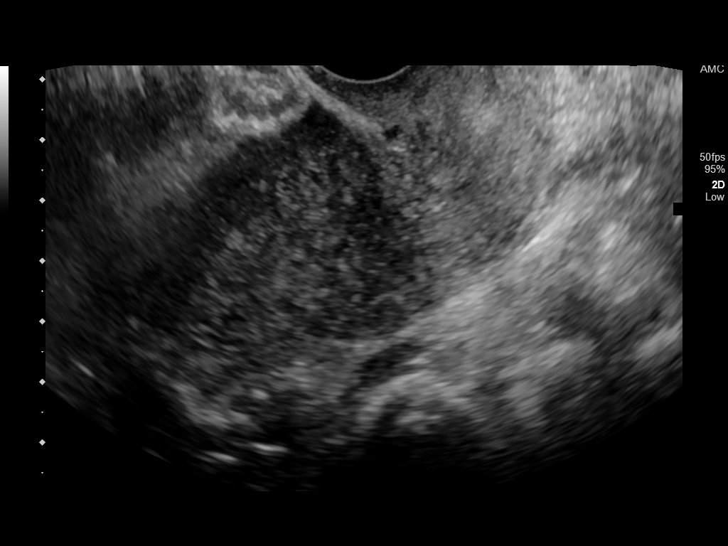
[im 24/47]
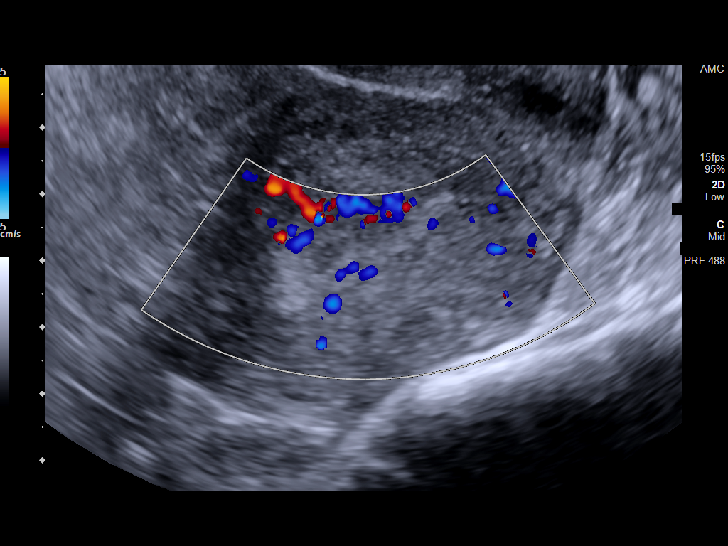
[im 27/47]
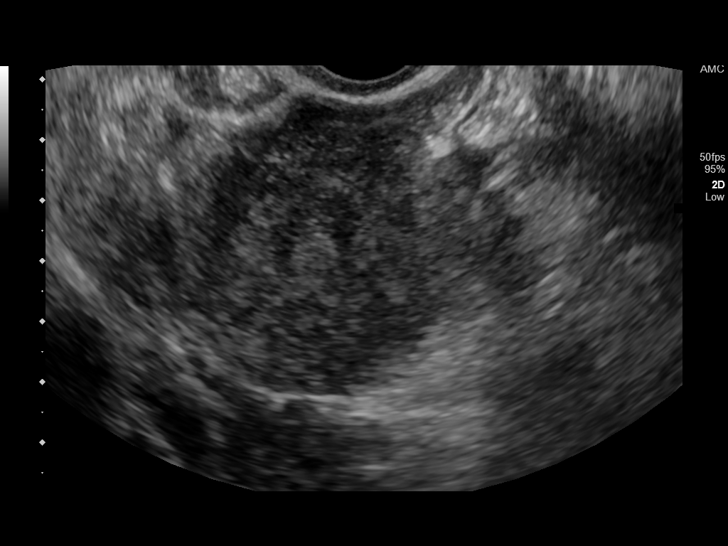
[im 31/47]
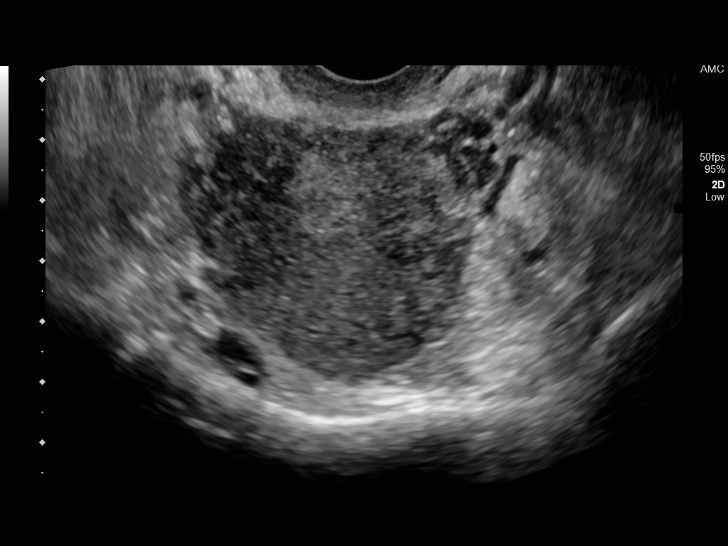
[im 35/47]
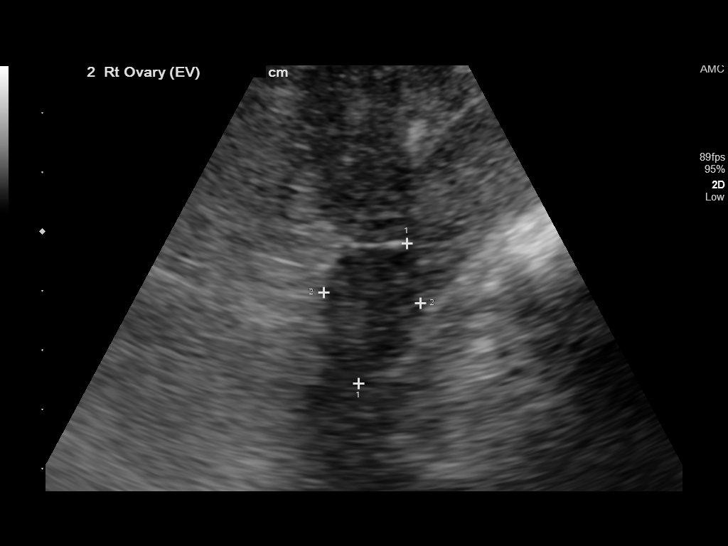
[im 39/47]
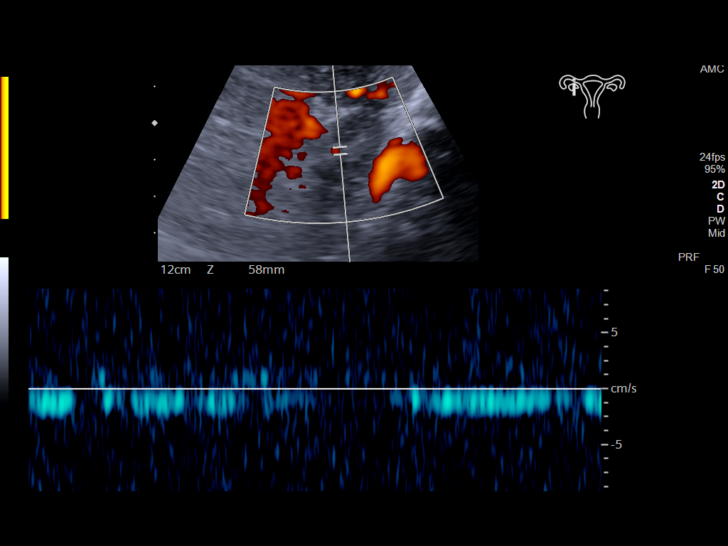
[im 43/47]
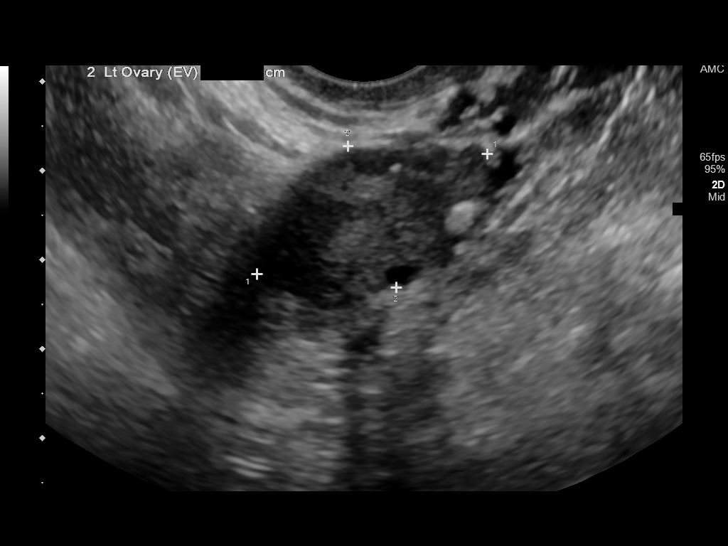
[im 47/47]
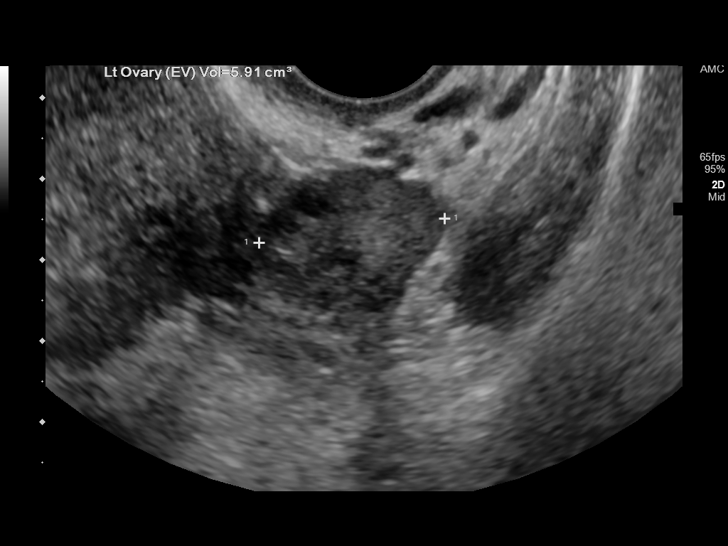

[13 of 25 positions shown; findings below may reference images not displayed]

FINDINGS: Uterus

Measurements: 8.5 x 4.8 x 5.8 cm = volume: 122 mL. Anteverted mildly
enlarged uterus with no uterine fibroids. Myometrial heterogeneity
with focally indistinct endometrial-myometrial interface in the
anterior uterine body may represent focal adenomyosis.

Endometrium

Thickness: 8 mm. Mildly heterogeneous endometrium. No convincing
focal endometrial mass.

Right ovary

Measurements: 2.5 x 1.6 x 1.8 cm = volume: 4.0 mL. Normal
appearance/no adnexal mass.

Left ovary

Measurements: 2.9 x 1.7 x 2.3 cm = volume: 6.0 mL. Normal
appearance/no adnexal mass.

Pulsed Doppler evaluation of both ovaries demonstrates normal
low-resistance arterial and venous waveforms.

Other findings

No abnormal free fluid.
IMPRESSION: 1. No evidence of adnexal torsion. Normal ovaries. No adnexal
masses.
2. Mildly enlarged anteverted uterus. Findings described above in
the anterior uterine body may indicate focal adenomyosis. No uterine
fibroids.

## 2018-11-04 MED ORDER — SODIUM CHLORIDE 0.9% FLUSH: 3.0000 mL | Freq: Once | INTRAVENOUS | Status: DC

## 2018-11-04 NOTE — ED Triage Notes (Addendum)
Pt to the er back pain and weakness and numbness in her legs. Pt is currently on her period. Pt has a hx of cysts on her ovaries which she has had pain in her back with her ovarian cysts.  Pt also has lower abdominal pain and pt states pain and numbness is consistent with previous history of cyst rupturing in fallopian tube which required emergency surgery.

## 2018-11-05 ENCOUNTER — Emergency Department: Payer: 59

## 2018-11-05 DIAGNOSIS — R200 Anesthesia of skin: Secondary | ICD-10-CM | POA: Diagnosis not present

## 2018-11-05 DIAGNOSIS — Z79899 Other long term (current) drug therapy: Secondary | ICD-10-CM | POA: Diagnosis not present

## 2018-11-05 DIAGNOSIS — M5441 Lumbago with sciatica, right side: Secondary | ICD-10-CM | POA: Diagnosis not present

## 2018-11-05 DIAGNOSIS — M549 Dorsalgia, unspecified: Secondary | ICD-10-CM | POA: Diagnosis not present

## 2018-11-05 DIAGNOSIS — M5442 Lumbago with sciatica, left side: Secondary | ICD-10-CM | POA: Diagnosis not present

## 2018-11-05 DIAGNOSIS — N939 Abnormal uterine and vaginal bleeding, unspecified: Secondary | ICD-10-CM | POA: Diagnosis not present

## 2018-11-05 DIAGNOSIS — F17210 Nicotine dependence, cigarettes, uncomplicated: Secondary | ICD-10-CM | POA: Diagnosis not present

## 2018-11-05 DIAGNOSIS — M546 Pain in thoracic spine: Secondary | ICD-10-CM | POA: Diagnosis not present

## 2018-11-05 DIAGNOSIS — Z3202 Encounter for pregnancy test, result negative: Secondary | ICD-10-CM | POA: Diagnosis not present

## 2018-11-05 DIAGNOSIS — M545 Low back pain: Secondary | ICD-10-CM | POA: Diagnosis not present

## 2018-11-05 DIAGNOSIS — R102 Pelvic and perineal pain: Secondary | ICD-10-CM | POA: Diagnosis not present

## 2018-11-05 IMAGING — MR MR THORACIC SPINE W/O CM
6 series · 33 of 48 positions shown · non-contrast
Comparison: None.

CLINICAL DATA: Back pain and lower extremity weakness and numbness.

EXAM:
MRI CERVICAL, THORACIC AND LUMBAR SPINE WITHOUT CONTRAST
TECHNIQUE: Multiplanar and multiecho pulse sequences of the cervical spine, to
include the craniocervical junction and cervicothoracic junction,
and thoracic and lumbar spine, were obtained without intravenous
contrast.

[Series 18: T1 · sagittal · 6.0mm · 1.88mm/px · 2 of 9 slices shown (1 of 2)]
[im 1/9]
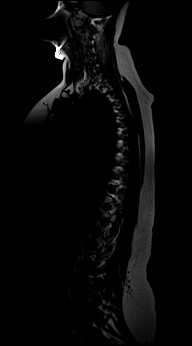
[im 9/9]
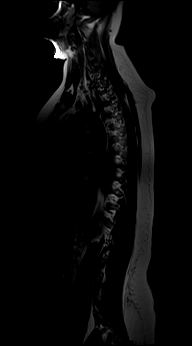

[Series 19: T2 · sagittal · 3.0mm · 1.33mm/px · 6 of 17 slices shown (1 of 2)]
[im 1/17]
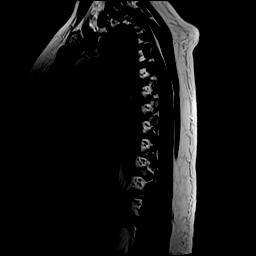
[im 4/17]
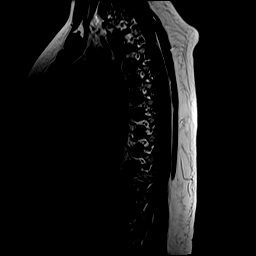
[im 7/17]
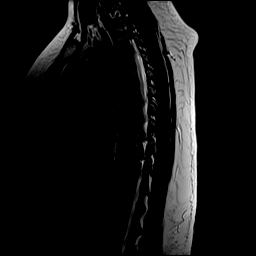
[im 10/17]
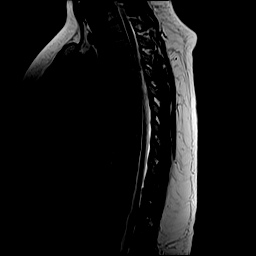
[im 13/17]
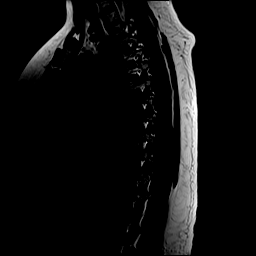
[im 17/17]
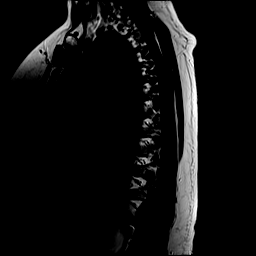

[Series 20: T1 · sagittal · 3.0mm · 1.33mm/px · 6 of 17 slices shown (2 of 2)]
[im 1/17]
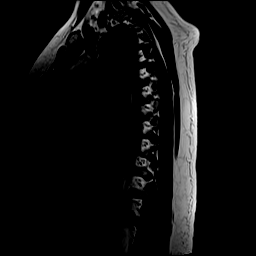
[im 4/17]
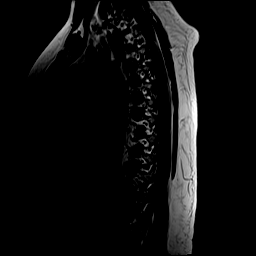
[im 7/17]
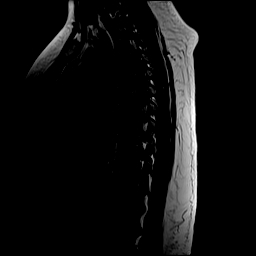
[im 10/17]
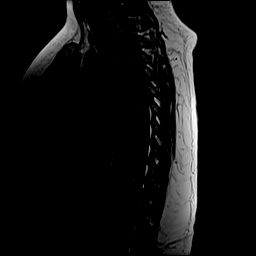
[im 13/17]
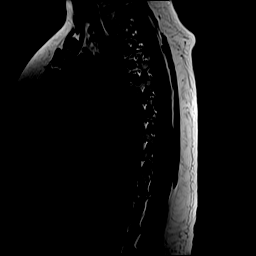
[im 17/17]
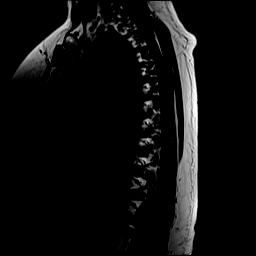

[Series 21: STIR · sagittal · 3.0mm · 0.66mm/px · 6 of 17 slices shown]
[im 1/17]
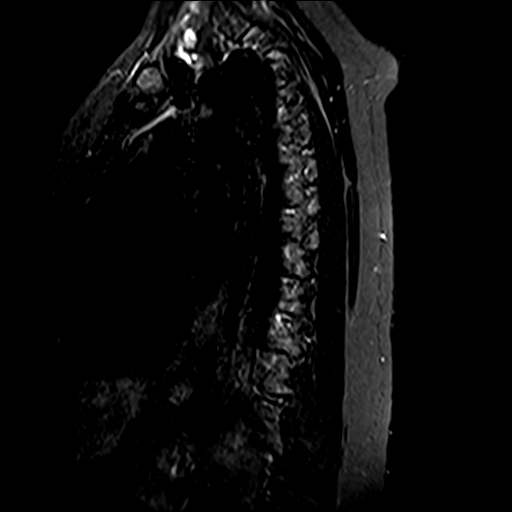
[im 4/17]
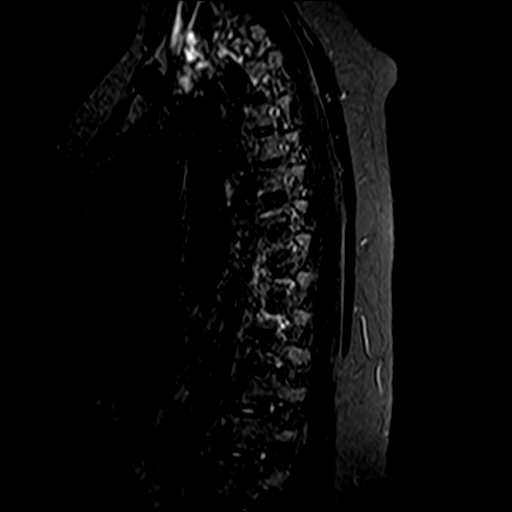
[im 7/17]
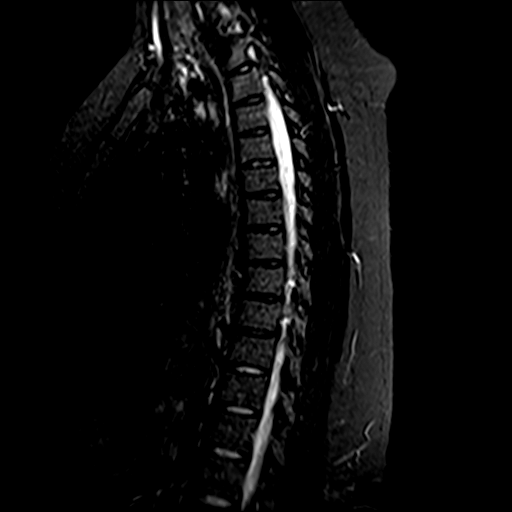
[im 10/17]
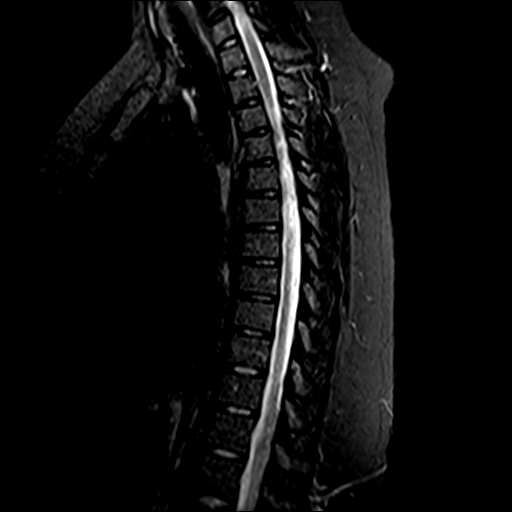
[im 13/17]
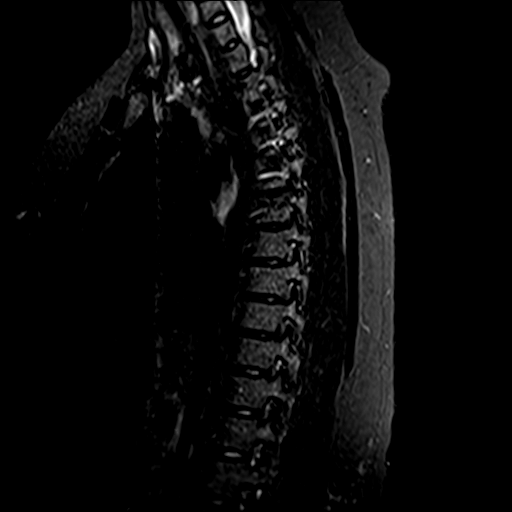
[im 17/17]
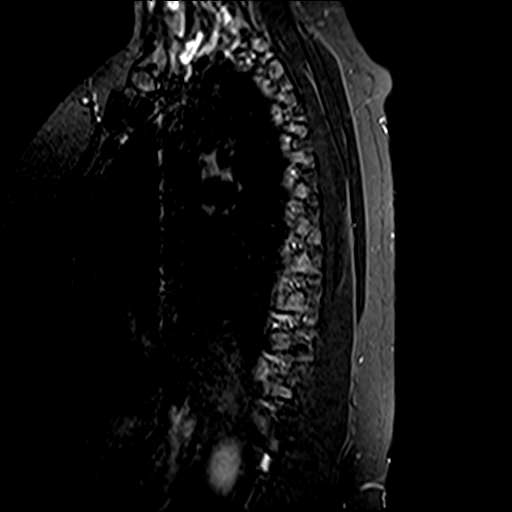

[Series 22: T2 · axial · 4.0mm · 0.59mm/px · z∈[-367,-145]mm · 8 of 39 slices shown (2 of 2)]
[im 1/39]
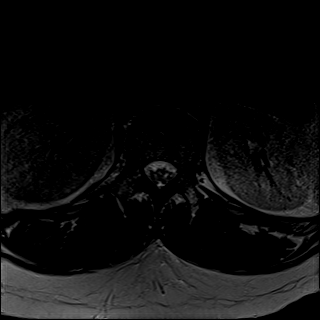
[im 6/39]
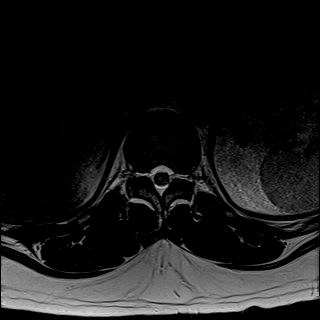
[im 12/39]
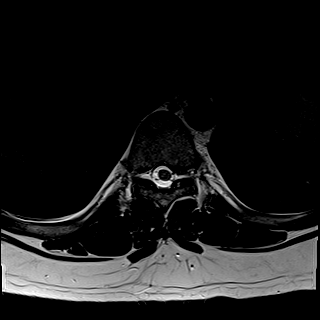
[im 18/39]
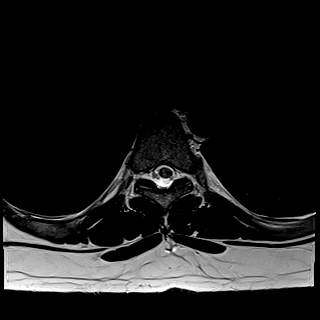
[im 21/39]
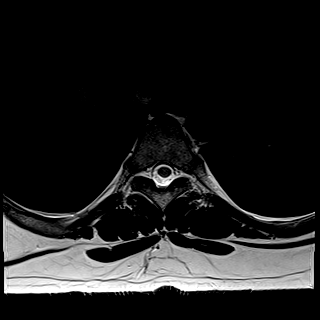
[im 27/39]
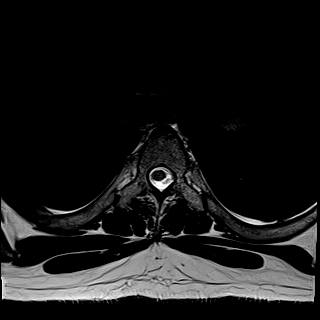
[im 33/39]
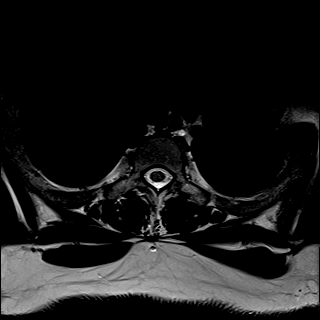
[im 39/39]
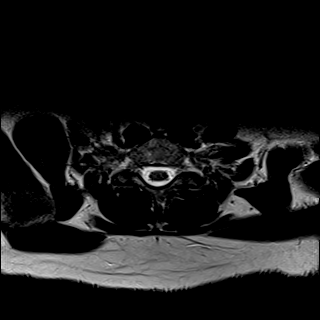

[Series 23: GRE · axial · 4.0mm · 0.37mm/px · z∈[-367,-209]mm · 5 of 39 slices shown]
[im 1/39]
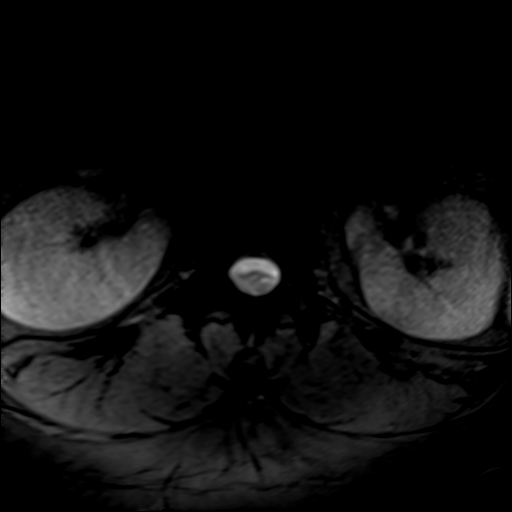
[im 6/39]
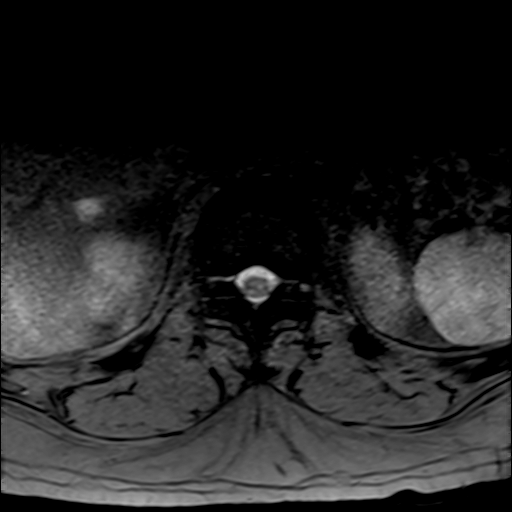
[im 12/39]
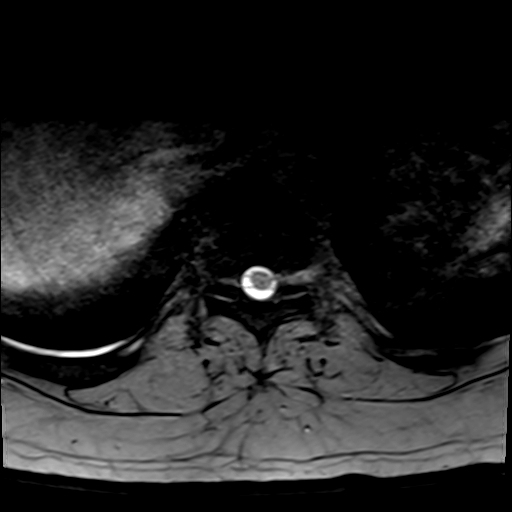
[im 18/39]
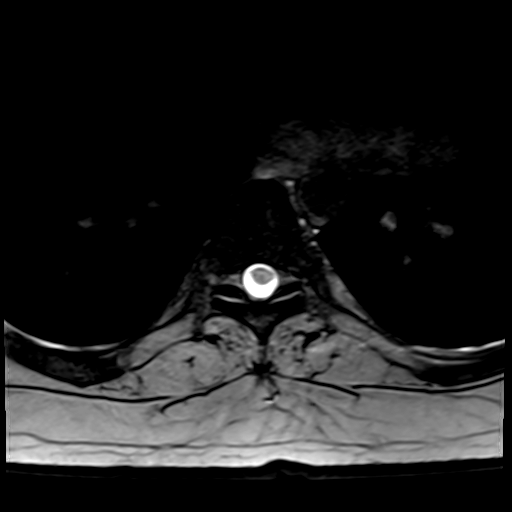
[im 21/39]
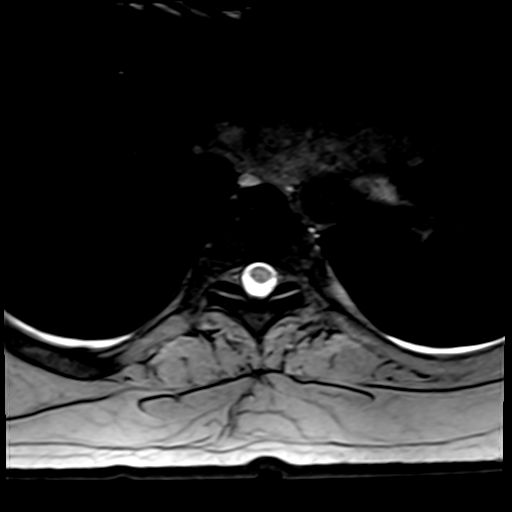

[33 of 48 positions shown; findings below may reference images not displayed]

FINDINGS: MRI CERVICAL SPINE FINDINGS

Alignment: Physiologic.

Vertebrae: No fracture, evidence of discitis, or bone lesion.

Cord: Normal signal and morphology.

Posterior Fossa, vertebral arteries, paraspinal tissues: Negative.

Disc levels:

No large disc herniation. No spinal canal or neural foraminal
stenosis.

MRI THORACIC SPINE FINDINGS

Alignment:  Physiologic.

Vertebrae: No fracture, evidence of discitis, or bone lesion.

Cord:  Normal signal and morphology.

Paraspinal and other soft tissues: Negative.

Disc levels:

No disc herniation or spinal canal stenosis. No neural impingement.

MRI LUMBAR SPINE FINDINGS

Segmentation: Transitional anatomy. The lowest disc space is
considered L6-S1.

Alignment:  Physiologic.

Vertebrae:  No fracture, evidence of discitis, or bone lesion.

Conus medullaris and cauda equina: Conus extends to the L1 level.
Conus and cauda equina appear normal.

Paraspinal and other soft tissues: Negative

Disc levels:

No spinal canal or neural foraminal stenosis. At the L5-6 level,
there is a small disc bulge with mild facet hypertrophy but no
stenosis or neural impingement.
IMPRESSION: 1. Normal cervical and thoracic spine.
2. Transitional lumbosacral anatomy with 6 lumbar type vertebral
bodies.
3. Minimal lower lumbar degenerative disc disease without spinal
canal stenosis or neural impingement.

## 2018-11-05 IMAGING — MR MR HEAD W/O CM
11 series · 43 of 48 positions shown · non-contrast
Comparison: None.

CLINICAL DATA: Numbness and weakness in both legs. Lower abdominal
pain, back pain.

EXAM:
MRI HEAD WITHOUT CONTRAST
TECHNIQUE: Multiplanar, multiecho pulse sequences of the brain and surrounding
structures were obtained without intravenous contrast.

[Series 23: ax dwi_tracew · axial · 3.0mm · 0.60mm/px · z∈[+21,+175]mm · 4 of 48 slices shown]
[im 1/48]
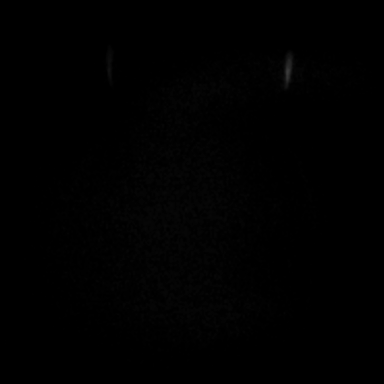
[im 16/48]
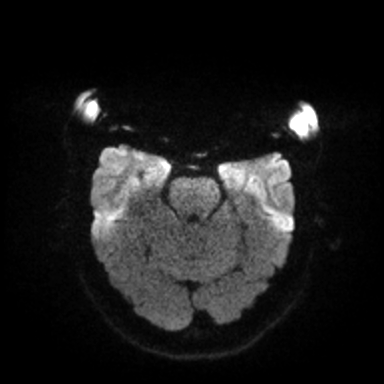
[im 32/48]
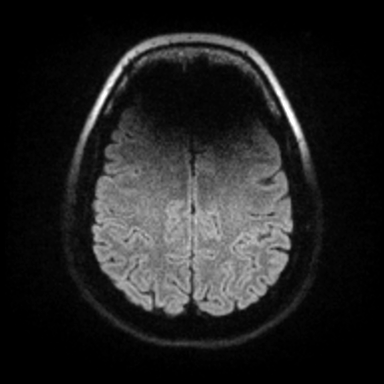
[im 48/48]
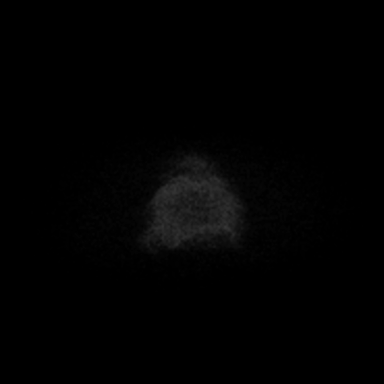

[Series 24: ax dwi_adc · axial · 3.0mm · 0.60mm/px · z∈[+24,+175]mm · 4 of 47 slices shown]
[im 1/47]
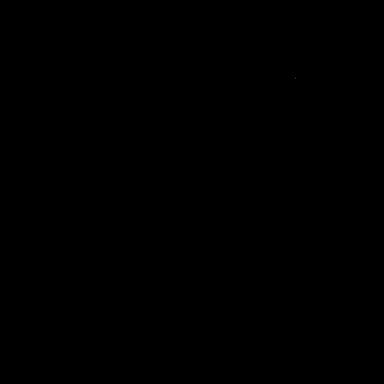
[im 16/47]
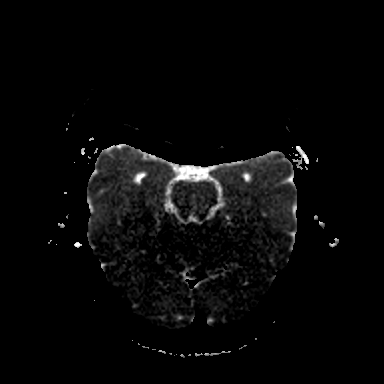
[im 31/47]
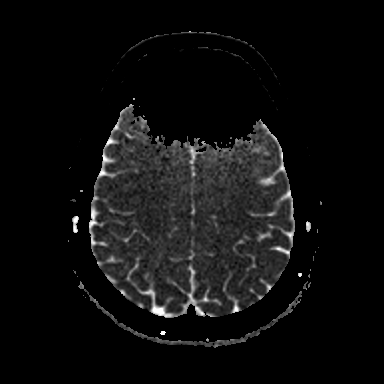
[im 47/47]
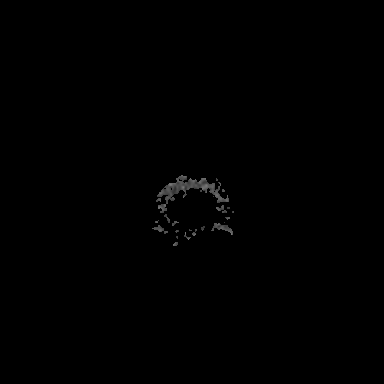

[Series 25: cor dwi_tracew · coronal · 5.0mm · 0.60mm/px · 3 of 38 slices shown]
[im 1/38]
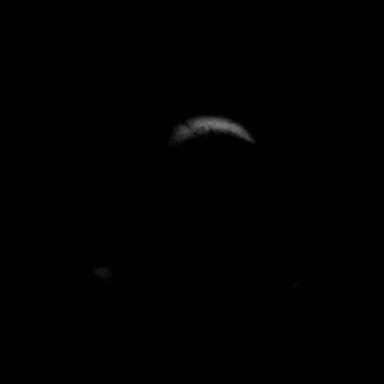
[im 19/38]
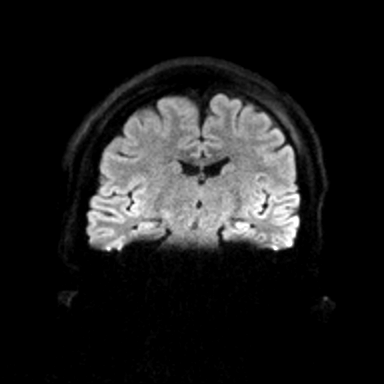
[im 38/38]
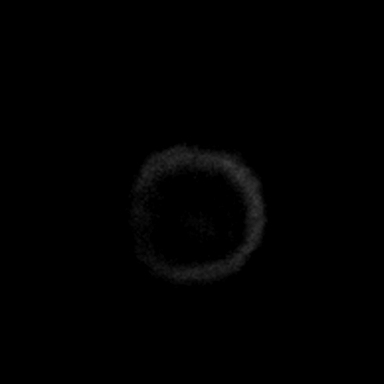

[Series 26: cor dwi_adc · coronal · 5.0mm · 0.60mm/px · 3 of 37 slices shown]
[im 1/37]
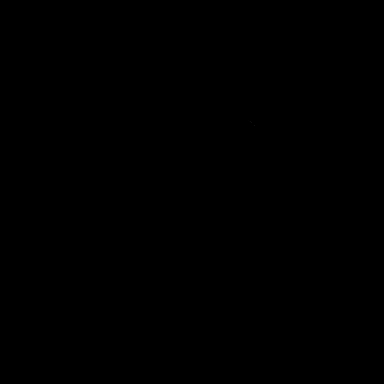
[im 19/37]
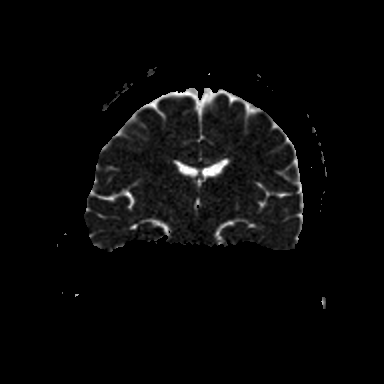
[im 37/37]
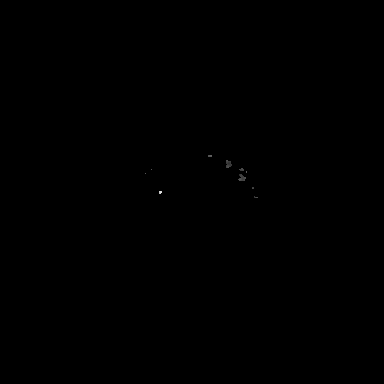

[Series 27: T1 · sagittal · 5.0mm · 0.62mm/px · 2 of 23 slices shown (1 of 2)]
[im 1/23]
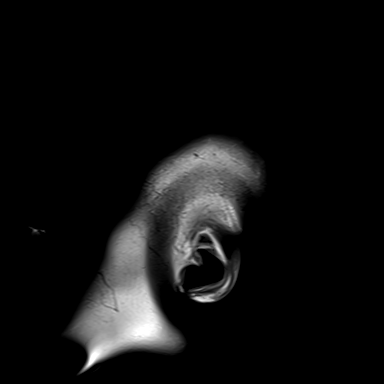
[im 23/23]
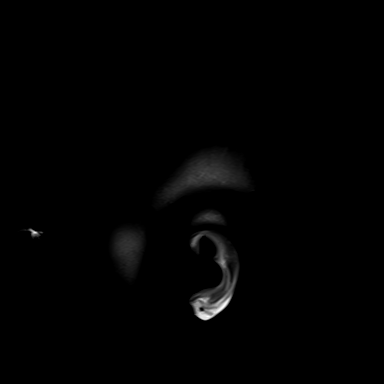

[Series 28: T2 · axial · 5.0mm · 0.53mm/px · z∈[+25,+169]mm · 2 of 25 slices shown]
[im 1/25]
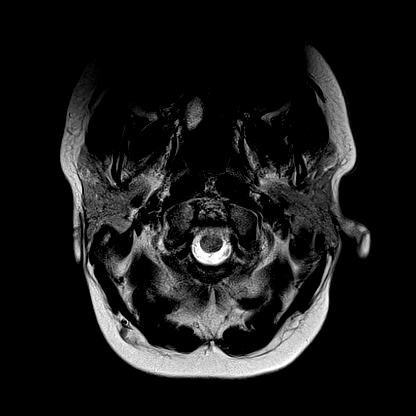
[im 25/25]
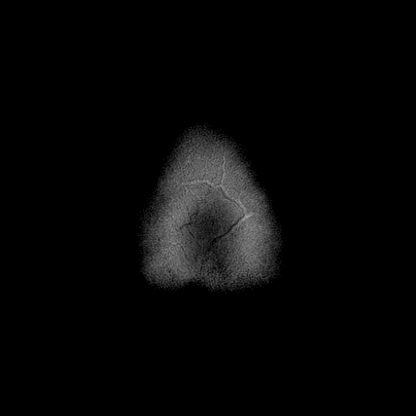

[Series 30: pha_images · axial · 3.0mm · 0.90mm/px · z∈[+9,+186]mm · 5 of 60 slices shown]
[im 1/60]
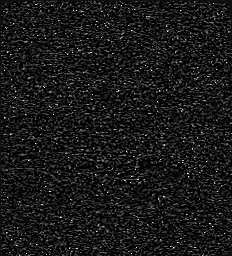
[im 15/60]
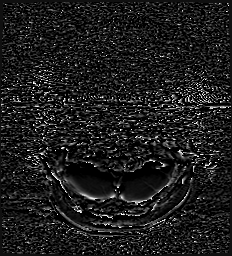
[im 30/60]
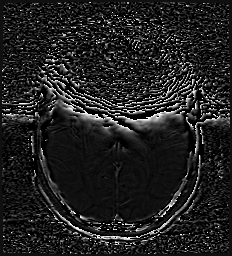
[im 45/60]
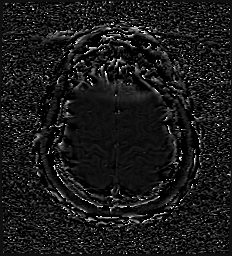
[im 60/60]
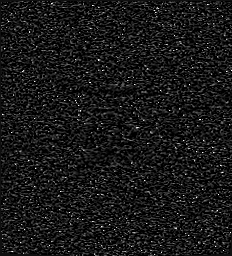

[Series 31: swi_images · axial · 3.0mm · 0.90mm/px · z∈[+9,+186]mm · 5 of 60 slices shown]
[im 1/60]
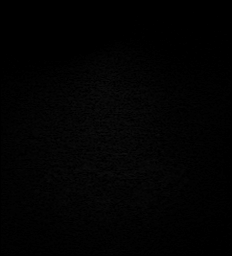
[im 15/60]
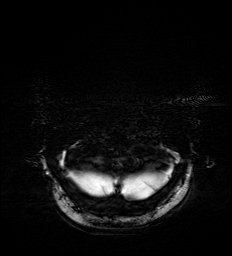
[im 30/60]
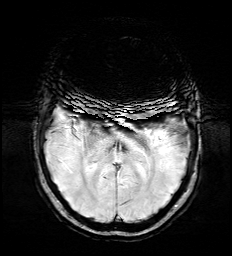
[im 45/60]
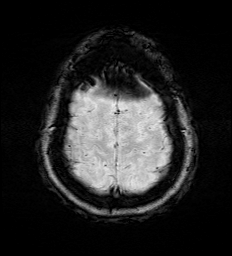
[im 60/60]
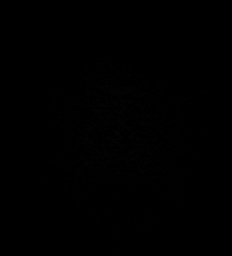

[Series 33: FLAIR · axial · 3.0mm · 0.53mm/px · z∈[+16,+178]mm · 5 of 55 slices shown]
[im 1/55]
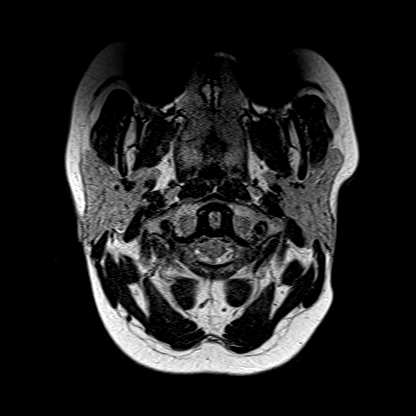
[im 14/55]
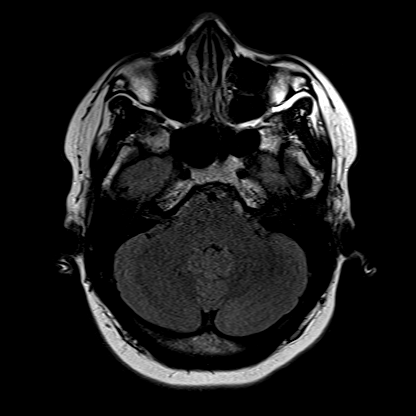
[im 28/55]
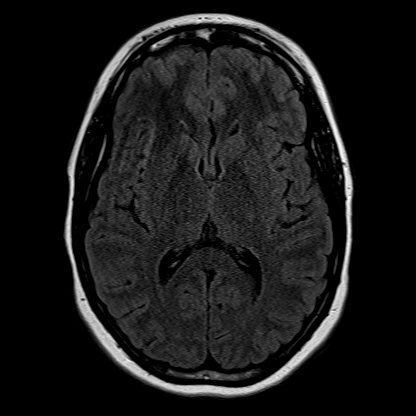
[im 41/55]
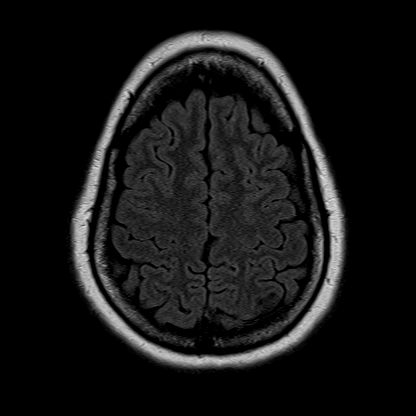
[im 55/55]
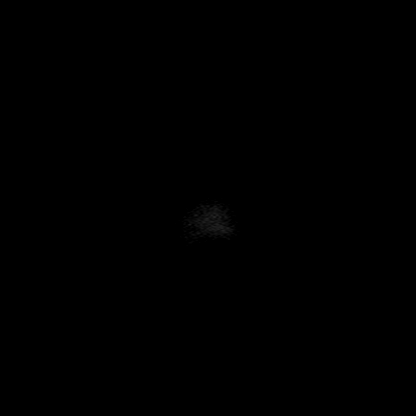

[Series 34: T1 · axial · 1.0mm · 0.98mm/px · z∈[+21,+179]mm · 8 of 160 slices shown (2 of 2)]
[im 1/160]
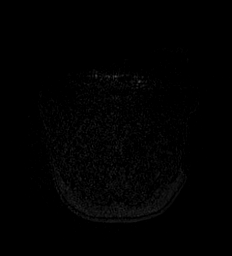
[im 27/160]
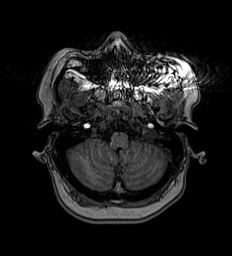
[im 54/160]
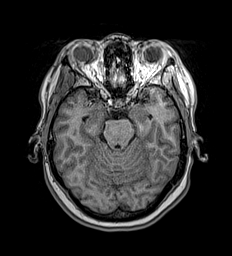
[im 67/160]
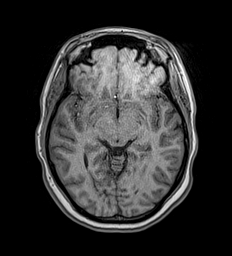
[im 93/160]
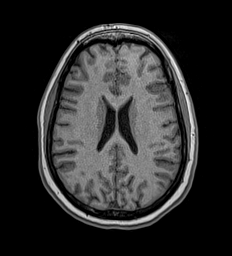
[im 107/160]
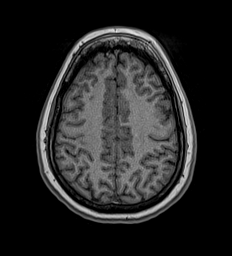
[im 133/160]
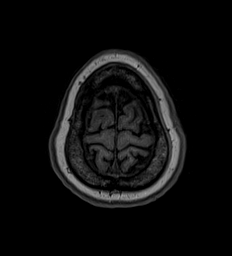
[im 160/160]
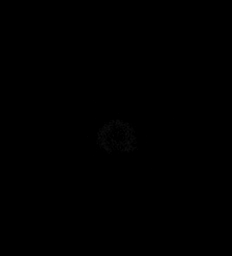

[Series 35: T2 post-contrast · coronal · 5.0mm · 0.57mm/px · 2 of 30 slices shown]
[im 1/30]
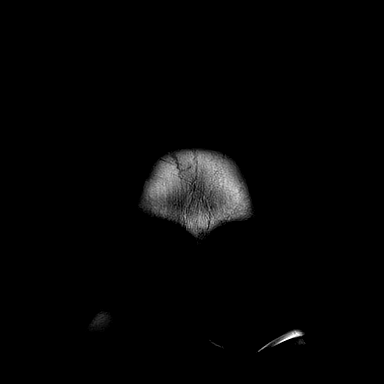
[im 30/30]
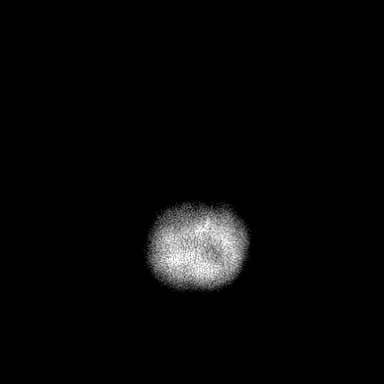

[43 of 48 positions shown; findings below may reference images not displayed]

FINDINGS: BRAIN: There is no acute infarct, acute hemorrhage or extra-axial
collection. The white matter signal is normal for the patient's age.
The cerebral and cerebellar volume are age-appropriate. There is no
hydrocephalus. The midline structures are normal.

VASCULAR: The major intracranial arterial and venous sinus flow
voids are normal. Susceptibility-sensitive sequences show no chronic
microhemorrhage or superficial siderosis.

SKULL AND UPPER CERVICAL SPINE: Calvarial bone marrow signal is
normal. There is no skull base mass. The visualized upper cervical
spine and soft tissues are normal.

SINUSES/ORBITS: There are no fluid levels or advanced mucosal
thickening. The mastoid air cells and middle ear cavities are free
of fluid. The orbits are normal.
IMPRESSION: Normal MRI of the brain.

## 2018-11-05 IMAGING — MR MR CERVICAL SPINE W/O CM
5 series · 39 of 48 positions shown · non-contrast
Comparison: None.

CLINICAL DATA: Back pain and lower extremity weakness and numbness.

EXAM:
MRI CERVICAL, THORACIC AND LUMBAR SPINE WITHOUT CONTRAST
TECHNIQUE: Multiplanar and multiecho pulse sequences of the cervical spine, to
include the craniocervical junction and cervicothoracic junction,
and thoracic and lumbar spine, were obtained without intravenous
contrast.

[Series 19: T2 · sagittal · 3.0mm · 0.62mm/px · 7 of 15 slices shown (1 of 2)]
[im 1/15]
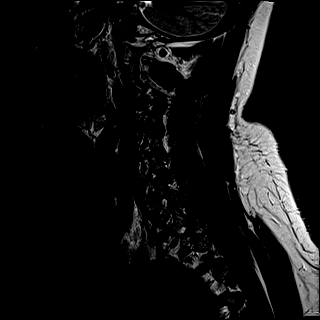
[im 3/15]
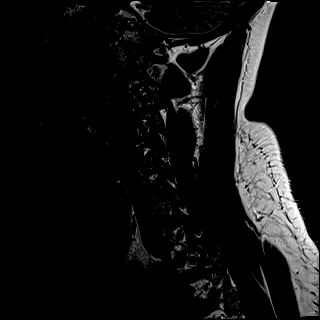
[im 5/15]
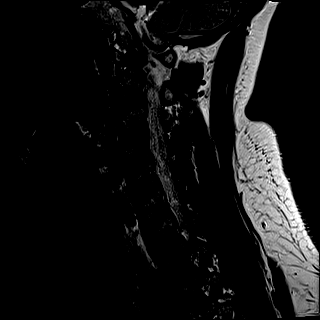
[im 8/15]
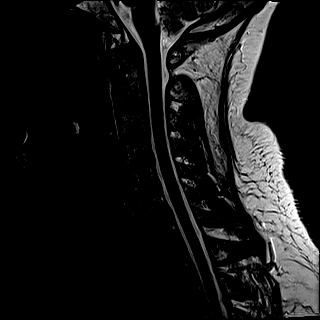
[im 10/15]
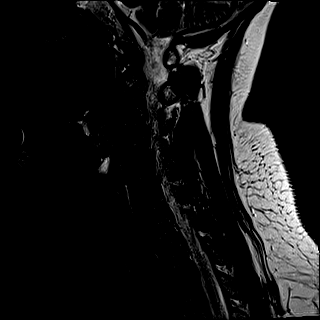
[im 12/15]
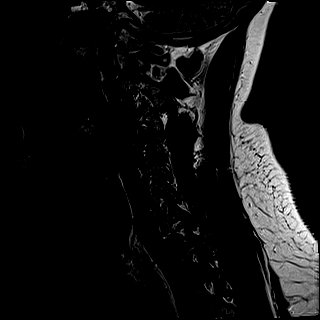
[im 15/15]
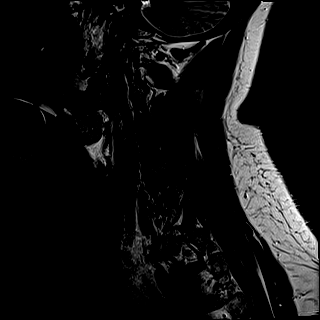

[Series 20: FLAIR · sagittal · 3.0mm · 0.78mm/px · 7 of 15 slices shown]
[im 1/15]
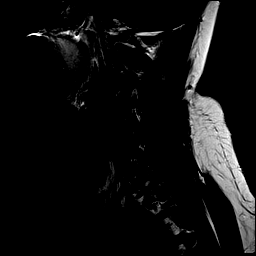
[im 3/15]
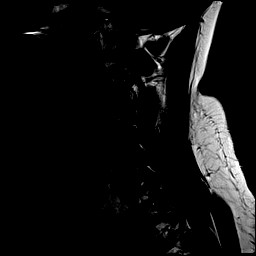
[im 5/15]
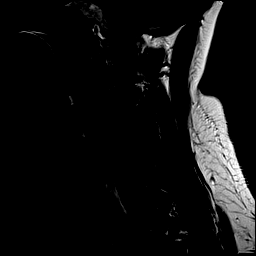
[im 8/15]
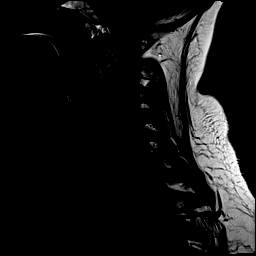
[im 10/15]
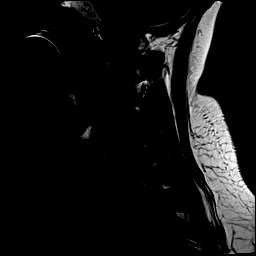
[im 12/15]
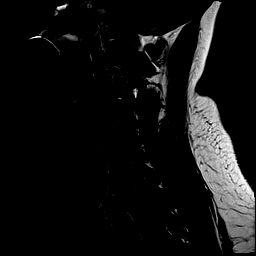
[im 15/15]
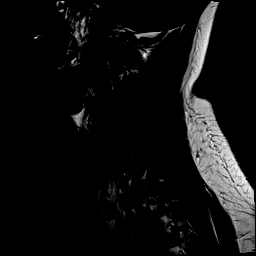

[Series 21: STIR · sagittal · 3.0mm · 0.62mm/px · 6 of 15 slices shown]
[im 1/15]
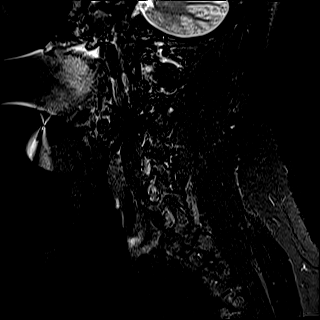
[im 3/15]
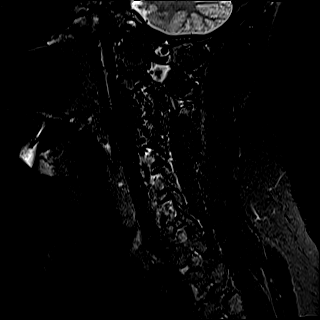
[im 6/15]
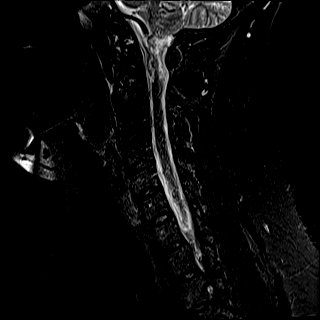
[im 9/15]
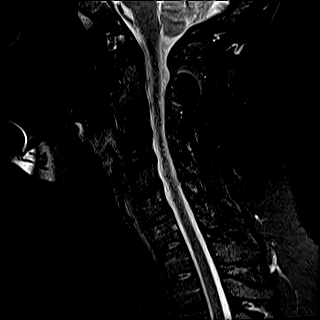
[im 12/15]
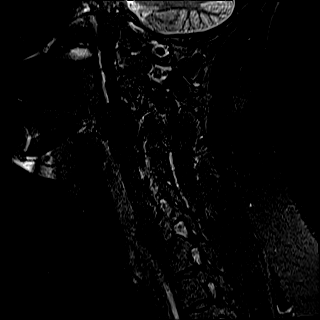
[im 15/15]
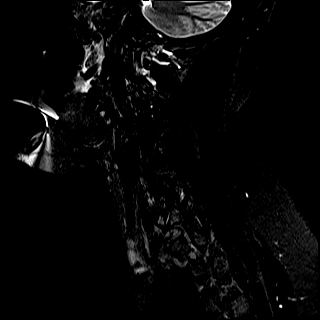

[Series 22: T2 · axial · 3.0mm · 0.70mm/px · z∈[-142,-34]mm · 11 of 33 slices shown (2 of 2)]
[im 1/33]
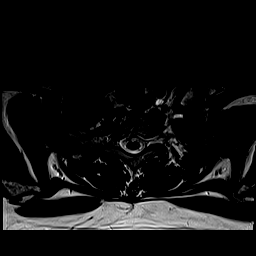
[im 3/33]
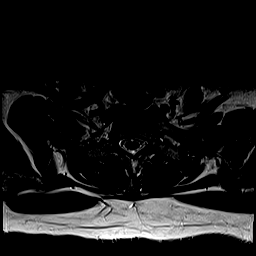
[im 5/33]
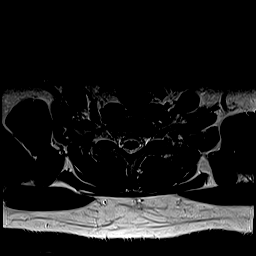
[im 8/33]
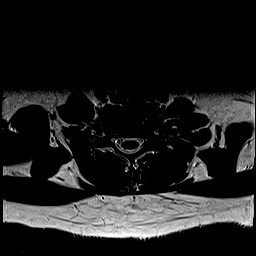
[im 10/33]
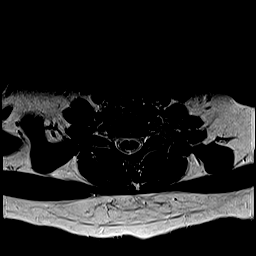
[im 13/33]
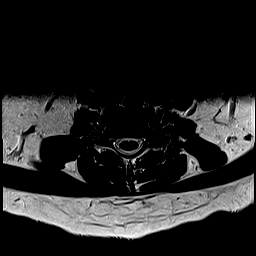
[im 15/33]
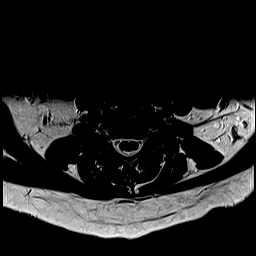
[im 18/33]
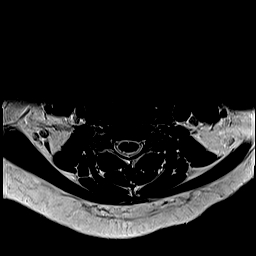
[im 23/33]
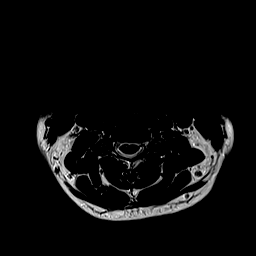
[im 28/33]
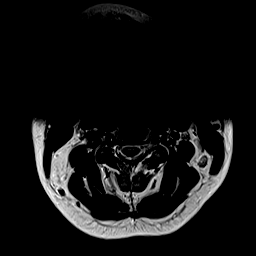
[im 33/33]
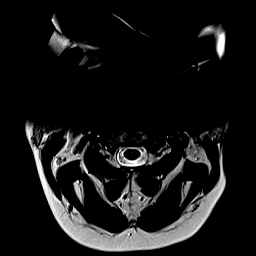

[Series 23: csp ax mpgr · axial · 3.0mm · 0.35mm/px · z∈[-142,-34]mm · 8 of 33 slices shown]
[im 1/33]
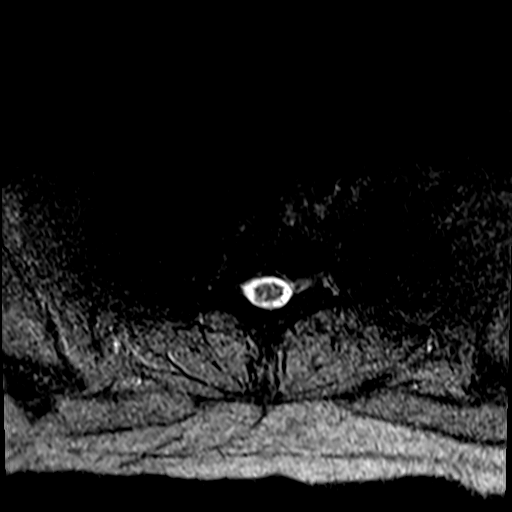
[im 5/33]
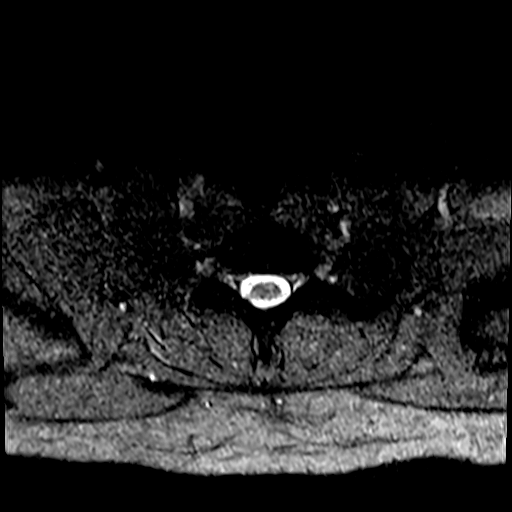
[im 10/33]
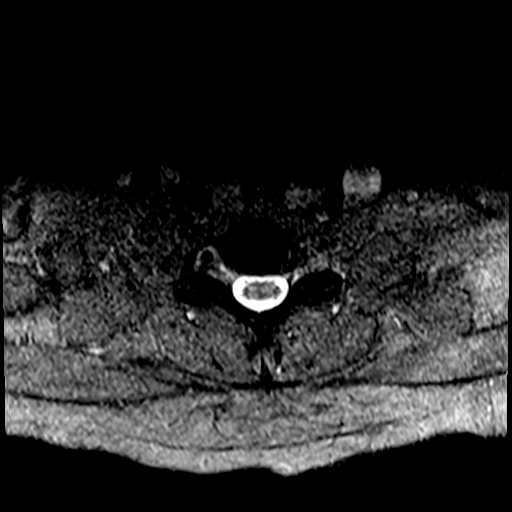
[im 15/33]
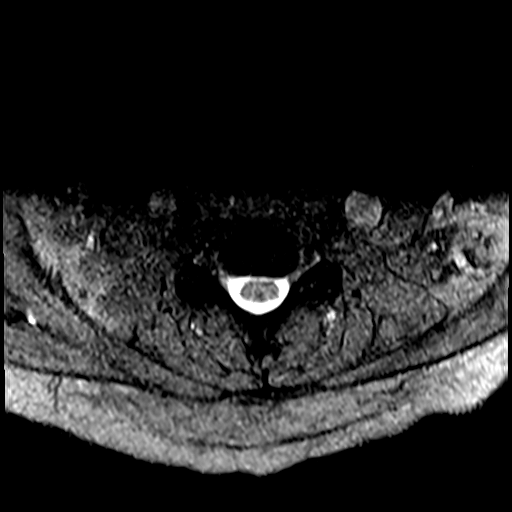
[im 18/33]
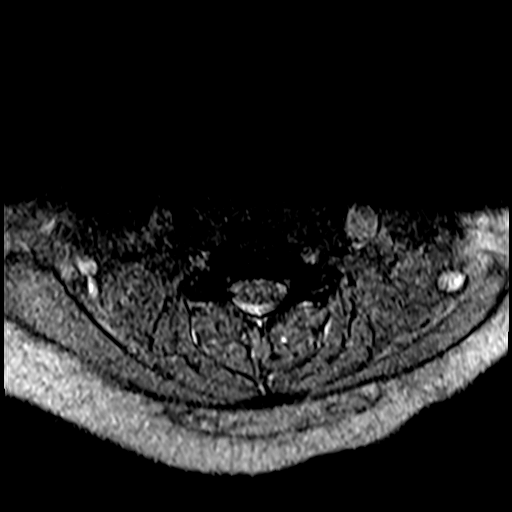
[im 23/33]
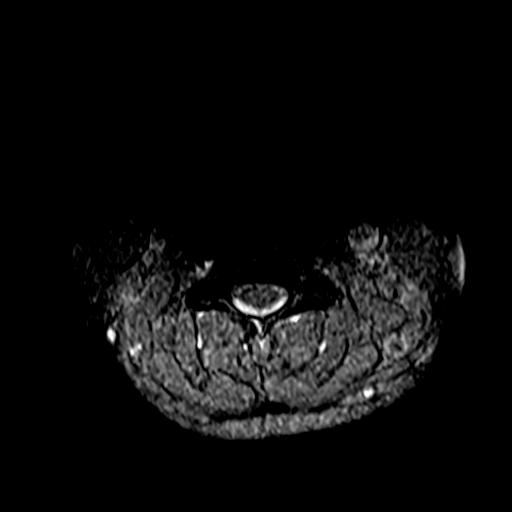
[im 28/33]
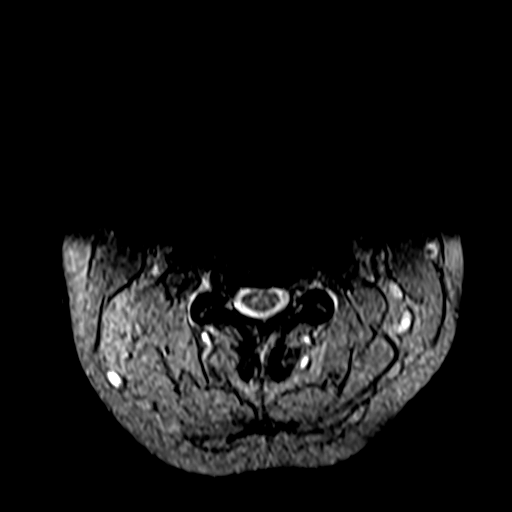
[im 33/33]
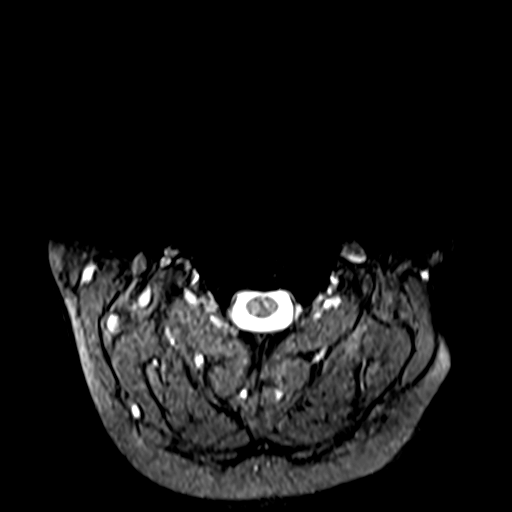

[39 of 48 positions shown; findings below may reference images not displayed]

FINDINGS: MRI CERVICAL SPINE FINDINGS

Alignment: Physiologic.

Vertebrae: No fracture, evidence of discitis, or bone lesion.

Cord: Normal signal and morphology.

Posterior Fossa, vertebral arteries, paraspinal tissues: Negative.

Disc levels:

No large disc herniation. No spinal canal or neural foraminal
stenosis.

MRI THORACIC SPINE FINDINGS

Alignment:  Physiologic.

Vertebrae: No fracture, evidence of discitis, or bone lesion.

Cord:  Normal signal and morphology.

Paraspinal and other soft tissues: Negative.

Disc levels:

No disc herniation or spinal canal stenosis. No neural impingement.

MRI LUMBAR SPINE FINDINGS

Segmentation: Transitional anatomy. The lowest disc space is
considered L6-S1.

Alignment:  Physiologic.

Vertebrae:  No fracture, evidence of discitis, or bone lesion.

Conus medullaris and cauda equina: Conus extends to the L1 level.
Conus and cauda equina appear normal.

Paraspinal and other soft tissues: Negative

Disc levels:

No spinal canal or neural foraminal stenosis. At the L5-6 level,
there is a small disc bulge with mild facet hypertrophy but no
stenosis or neural impingement.
IMPRESSION: 1. Normal cervical and thoracic spine.
2. Transitional lumbosacral anatomy with 6 lumbar type vertebral
bodies.
3. Minimal lower lumbar degenerative disc disease without spinal
canal stenosis or neural impingement.

## 2018-11-05 IMAGING — MR MR LUMBAR SPINE W/O CM
5 series · 36 of 48 positions shown · non-contrast
Comparison: None.

CLINICAL DATA: Back pain and lower extremity weakness and numbness.

EXAM:
MRI CERVICAL, THORACIC AND LUMBAR SPINE WITHOUT CONTRAST
TECHNIQUE: Multiplanar and multiecho pulse sequences of the cervical spine, to
include the craniocervical junction and cervicothoracic junction,
and thoracic and lumbar spine, were obtained without intravenous
contrast.

[Series 19: T2 · sagittal · 4.0mm · 1.02mm/px · 8 of 18 slices shown (1 of 2)]
[im 1/18]
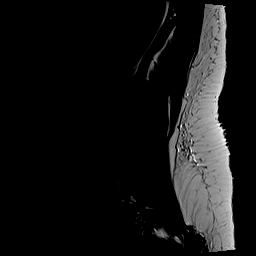
[im 3/18]
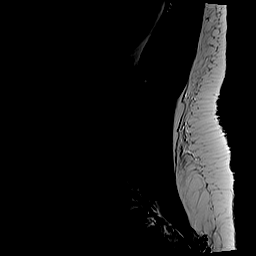
[im 5/18]
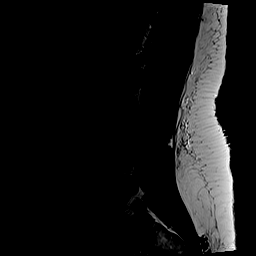
[im 8/18]
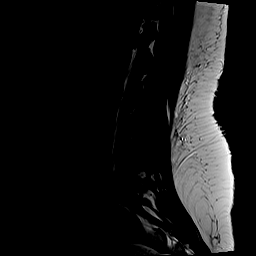
[im 10/18]
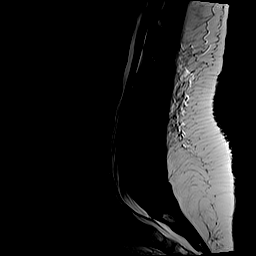
[im 13/18]
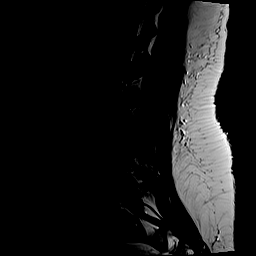
[im 15/18]
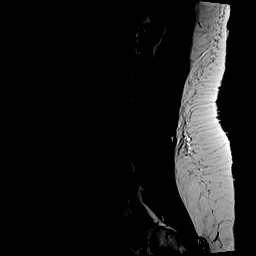
[im 18/18]
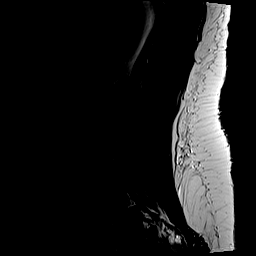

[Series 20: T1 · sagittal · 4.0mm · 1.02mm/px · 8 of 18 slices shown (1 of 2)]
[im 1/18]
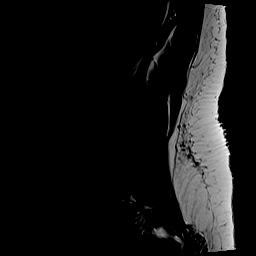
[im 3/18]
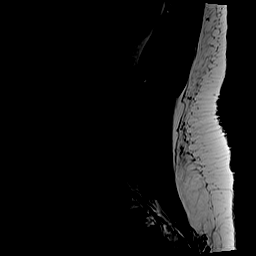
[im 5/18]
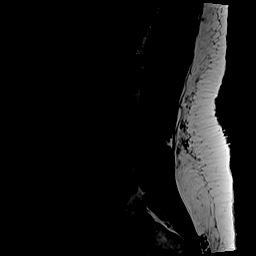
[im 8/18]
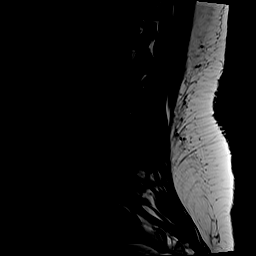
[im 10/18]
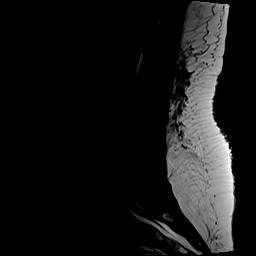
[im 13/18]
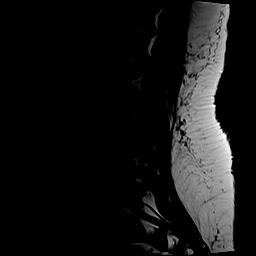
[im 15/18]
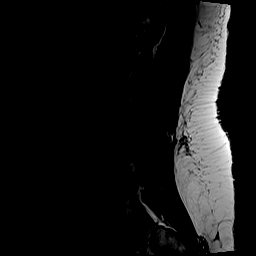
[im 18/18]
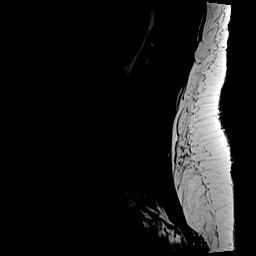

[Series 21: STIR · sagittal · 4.0mm · 0.51mm/px · 2 of 18 slices shown]
[im 1/18]
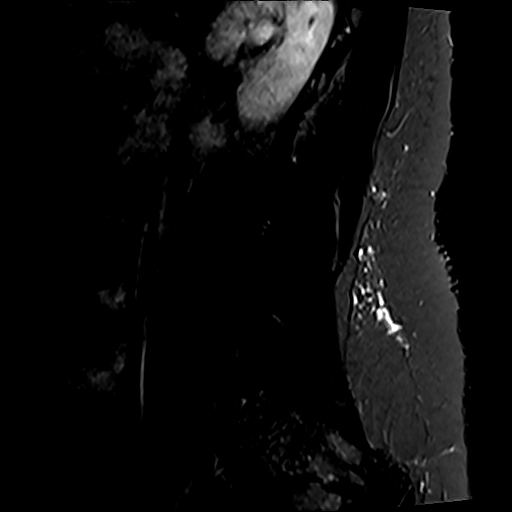
[im 3/18]
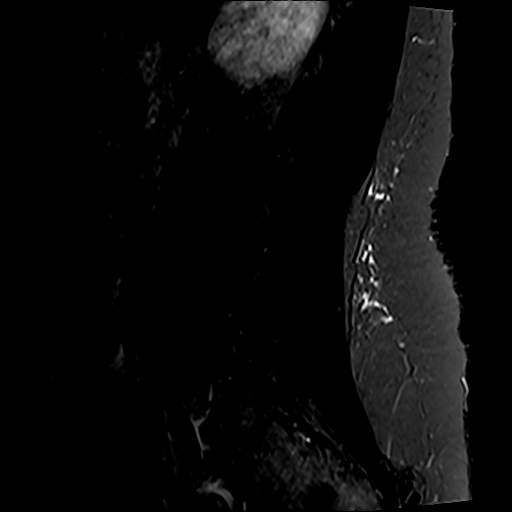

[Series 22: T2 · axial · 4.0mm · 0.78mm/px · z∈[-592,-419]mm · 9 of 29 slices shown (2 of 2)]
[im 1/29]
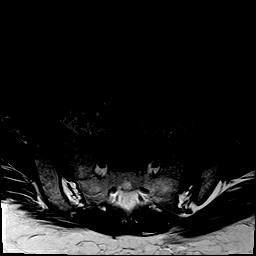
[im 6/29]
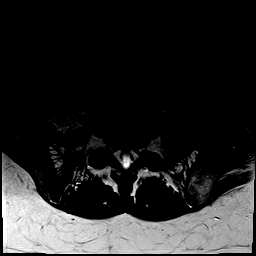
[im 8/29]
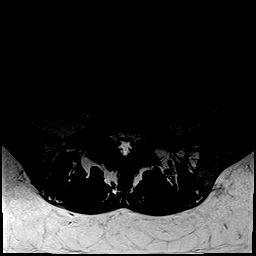
[im 13/29]
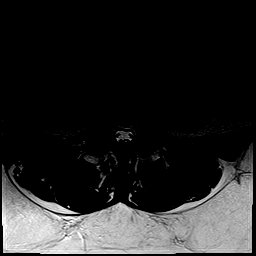
[im 16/29]
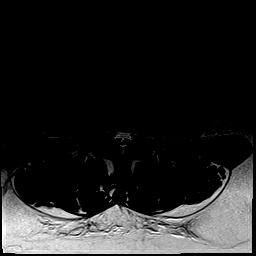
[im 21/29]
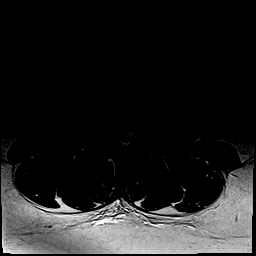
[im 23/29]
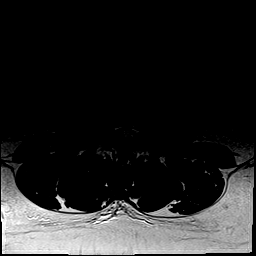
[im 26/29]
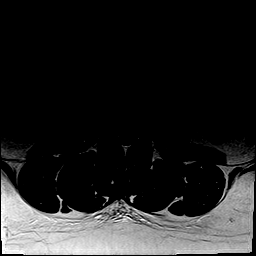
[im 29/29]
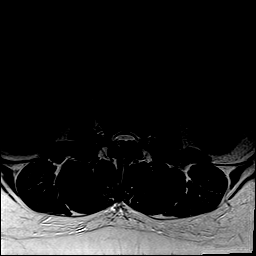

[Series 23: T1 · axial · 4.0mm · 0.39mm/px · z∈[-592,-419]mm · 9 of 29 slices shown (2 of 2)]
[im 1/29]
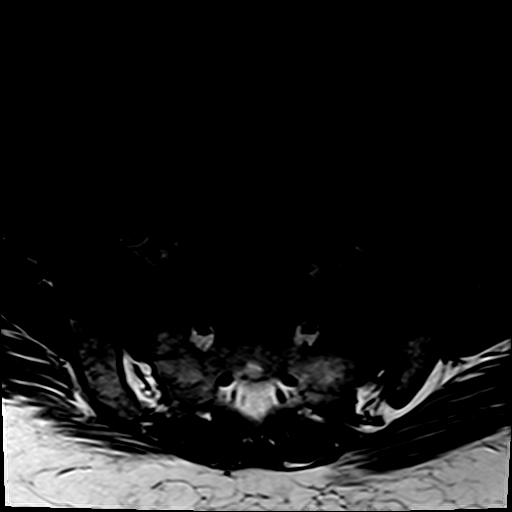
[im 6/29]
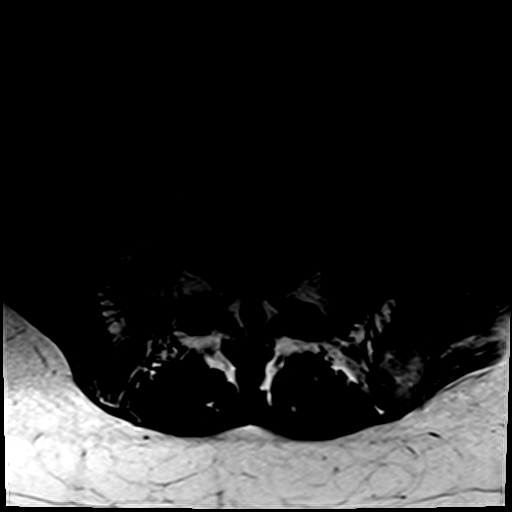
[im 8/29]
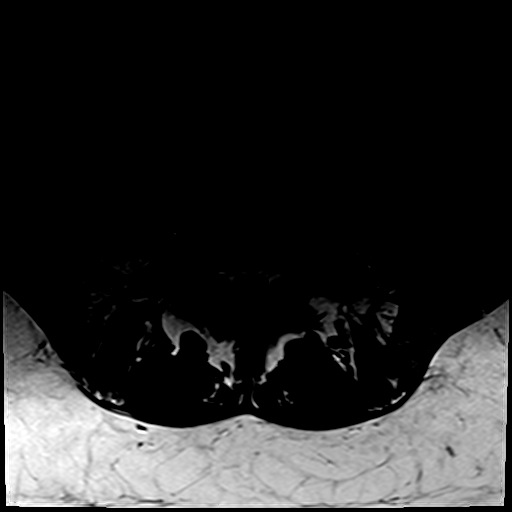
[im 13/29]
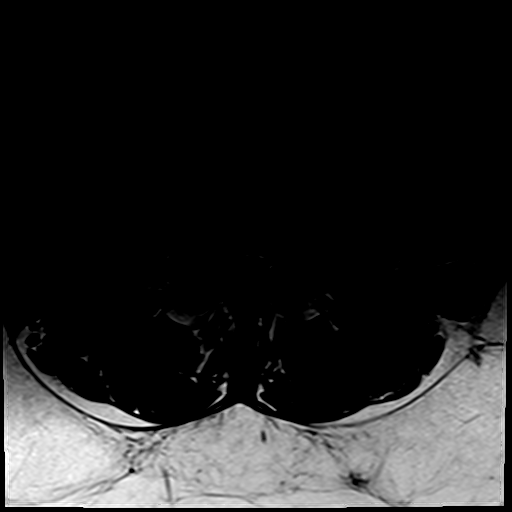
[im 16/29]
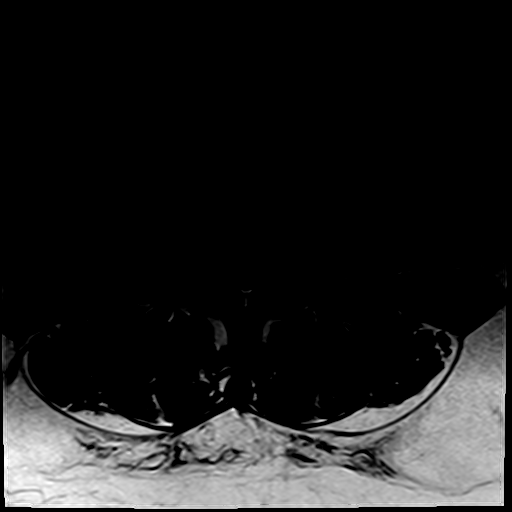
[im 21/29]
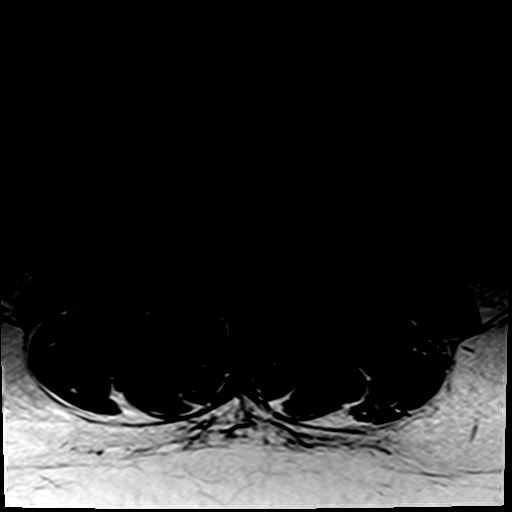
[im 23/29]
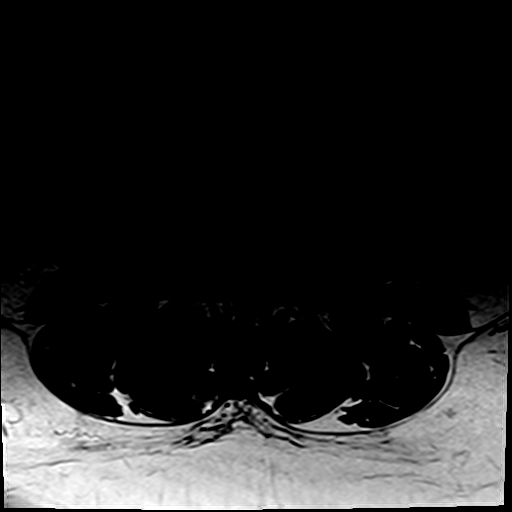
[im 26/29]
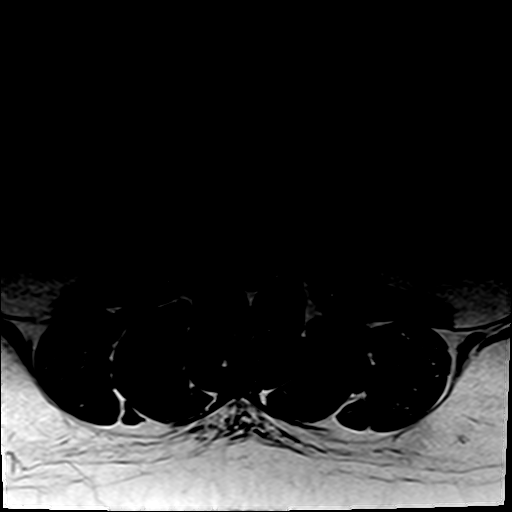
[im 29/29]
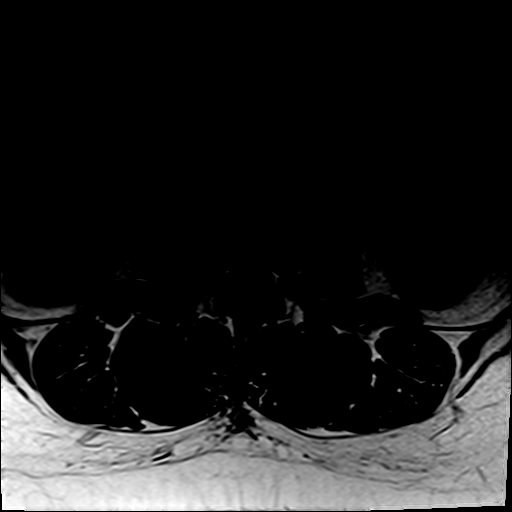

[36 of 48 positions shown; findings below may reference images not displayed]

FINDINGS: MRI CERVICAL SPINE FINDINGS

Alignment: Physiologic.

Vertebrae: No fracture, evidence of discitis, or bone lesion.

Cord: Normal signal and morphology.

Posterior Fossa, vertebral arteries, paraspinal tissues: Negative.

Disc levels:

No large disc herniation. No spinal canal or neural foraminal
stenosis.

MRI THORACIC SPINE FINDINGS

Alignment:  Physiologic.

Vertebrae: No fracture, evidence of discitis, or bone lesion.

Cord:  Normal signal and morphology.

Paraspinal and other soft tissues: Negative.

Disc levels:

No disc herniation or spinal canal stenosis. No neural impingement.

MRI LUMBAR SPINE FINDINGS

Segmentation: Transitional anatomy. The lowest disc space is
considered L6-S1.

Alignment:  Physiologic.

Vertebrae:  No fracture, evidence of discitis, or bone lesion.

Conus medullaris and cauda equina: Conus extends to the L1 level.
Conus and cauda equina appear normal.

Paraspinal and other soft tissues: Negative

Disc levels:

No spinal canal or neural foraminal stenosis. At the L5-6 level,
there is a small disc bulge with mild facet hypertrophy but no
stenosis or neural impingement.
IMPRESSION: 1. Normal cervical and thoracic spine.
2. Transitional lumbosacral anatomy with 6 lumbar type vertebral
bodies.
3. Minimal lower lumbar degenerative disc disease without spinal
canal stenosis or neural impingement.

## 2018-11-05 MED ORDER — ONDANSETRON HCL 4 MG/2ML IJ SOLN
4.0000 mg | INTRAMUSCULAR | Status: AC
  Administered 2018-11-05: 01:00:00 4 mg via INTRAVENOUS
  Filled 2018-11-05: qty 2

## 2018-11-05 MED ORDER — MORPHINE SULFATE (PF) 4 MG/ML IV SOLN
4.0000 mg | Freq: Once | INTRAVENOUS | Status: AC
  Administered 2018-11-05: 4 mg via INTRAVENOUS
  Filled 2018-11-05: qty 1

## 2018-11-05 NOTE — ED Notes (Signed)
Pt back from MRI at this time

## 2018-11-05 NOTE — ED Provider Notes (Signed)
[facility] Emergency Department Provider Note  ____________________________________________   First MD Initiated Contact with Patient 11/05/18 0000     (approximate)  I have reviewed the triage vital signs and the nursing notes.   HISTORY  Chief Complaint Back Pain    HPI Sandra Castaneda is a 33 y.o. female with no reported chronic medical issues who presents for evaluation of relatively acute onset pain in the middle of her back that radiates down both of her legs with associated numbness and weakness.  She said that she had some similar but milder pain years ago when she had an ovarian cyst that ruptured and she required emergency surgery.  The symptoms started within the last 24 hours and it gradually got worse and she currently reports that the pain is sharp and severe.  She says that she has difficulty walking because of the weakness in her legs.  She has no weakness or numbness in her arms.  She had a headache yesterday but that resolved on its own.  No visual changes.  She denies fever/chills, sore throat, chest pain, shortness of breath, nausea, vomiting, abdominal pain.  She uses birth control and has had a tubal ligation but started having a menstrual cycle within the last couple of days and is passing what she describes as a large amount of blood as well as some clots.  She is not lightheaded or dizzy.   She has no history of kidney stones.  Nothing in particular makes the symptoms better or worse.  She does not use IV drugs.  She reports that she had an epidural a few years ago when she had her child but has had no other trauma or invasive procedures to her back.  No report of difficulty urinating, urinary incontinence, or fecal incontinence.        Past Medical History:  Diagnosis Date   BRCA negative    per pt report, done through [facility]   Increased risk of breast cancer    IBIS=25.3%   Ovarian cyst 2007    Patient Active Problem List     Diagnosis Date Noted   Dysmenorrhea 07/22/2016   Menorrhagia with irregular cycle 07/22/2016    Past Surgical History:  Procedure Laterality Date   CESAREAN SECTION  2008   FTP   CESAREAN SECTION WITH BILATERAL TUBAL LIGATION N/A 02/04/2016   Procedure: CESAREAN SECTION WITH BILATERAL TUBAL LIGATION;  Surgeon: Will Bonnet, MD;  Location: [facility] ORS;  Service: Obstetrics;  Laterality: N/A;   LAPAROTOMY  2007    Prior to Admission medications   Medication Sig Start Date End Date Taking? Authorizing Provider  ciprofloxacin (CIPRO) 500 MG tablet Take 1 tablet (500 mg total) by mouth 2 (two) times daily. Patient not taking: Reported on 06/12/2017 08/11/16   Johnn Hai, PA-C  HYDROcodone-acetaminophen (NORCO/VICODIN) 5-325 MG tablet Take 1 tablet by mouth every 4 (four) hours as needed for moderate pain. Patient not taking: Reported on 06/12/2017 08/11/16   Johnn Hai, PA-C  levonorgestrel-ethinyl estradiol (SEASONALE) 0.15-0.03 MG tablet Take 1 tablet by mouth daily. 09/19/18   Will Bonnet, MD  ondansetron (ZOFRAN ODT) 4 MG disintegrating tablet Take 1 tablet (4 mg total) by mouth every 8 (eight) hours as needed for nausea or vomiting. Patient not taking: Reported on 06/12/2017 08/11/16   Johnn Hai, PA-C    Allergies Patient has no known allergies.  Family History  Problem Relation Age of Onset   Brain cancer  Mother    Hypertension Mother    Ovarian cancer Mother 34   Lung cancer Mother    Diabetes Mellitus I Father    Pancreatic cancer Father 9   Hypertension Brother    Breast cancer Maternal Aunt 28   Diabetes Mellitus II Paternal Aunt    Diabetes Mellitus II Paternal Grandmother    Breast cancer Maternal Aunt 74    Social History Social History   Tobacco Use   Smoking status: Current Every Day Smoker    Packs/day: 0.25   Smokeless tobacco: Never Used  Substance Use Topics   Alcohol use: No   Drug use: No    Review of  Systems Constitutional: No fever/chills Eyes: No visual changes. ENT: No sore throat. Cardiovascular: Denies chest pain. Respiratory: Denies shortness of breath. Gastrointestinal: No abdominal pain.  No nausea, no vomiting.  No diarrhea.  No constipation. Genitourinary: Negative for dysuria. Musculoskeletal: Negative for neck pain.  Negative for back pain. Integumentary: Negative for rash. Neurological: Mid back pain radiating down both legs with associated numbness and weakness.   ____________________________________________   PHYSICAL EXAM:  VITAL SIGNS: ED Triage Vitals  Enc Vitals Group     BP 11/04/18 1908 (!) 171/100     Pulse Rate 11/04/18 1908 90     Resp 11/04/18 1908 18     Temp 11/04/18 1908 98.2 F (36.8 C)     Temp Source 11/04/18 1908 Oral     SpO2 11/04/18 1908 96 %     Weight 11/04/18 1903 113.4 kg (250 lb)     Height 11/04/18 1903 1.803 m ('5\' 11"'$ )     Head Circumference --      Peak Flow --      Pain Score 11/04/18 1902 8     Pain Loc --      Pain Edu? --      Excl. in [place]? --     Constitutional: Alert and oriented.  Does not appear to be in acute distress but reports severe back pain. Eyes: Conjunctivae are normal.  Head: Atraumatic. Nose: No congestion/rhinnorhea. Mouth/Throat: Mucous membranes are moist. Neck: No stridor.  No meningeal signs.   Cardiovascular: Normal rate, regular rhythm. Good peripheral circulation. Grossly normal heart sounds. Respiratory: Normal respiratory effort.  No retractions. Gastrointestinal: Soft and nontender. No distention.  Musculoskeletal: Mild left-sided CVA tenderness to percussion.  No tenderness to palpation down the spine itself with no step-offs or deformities.  No lower extremity tenderness nor edema. No gross deformities of extremities. Neurologic:  Normal speech and language. No gross cranial nerve deficits.  Normal upper extremity strength without subjective loss of sensation.  The patient has decreased  strength and bilateral lower extremities but is difficult to appreciate if this is effort based or truly limited neurologically.  She also reports a subjective numbness in bilateral lower extremities.  She does not specifically have saddle anesthesia. Skin:  Skin is warm, dry and intact. Psychiatric: Mood and affect are normal. Speech and behavior are normal.  ____________________________________________   LABS (all labs ordered are listed, but only abnormal results are displayed)  Labs Reviewed  COMPREHENSIVE METABOLIC PANEL - Abnormal; Notable for the following components:      Result Value   Potassium 3.3 (*)    Calcium 8.7 (*)    All other components within normal limits  CBC - Abnormal; Notable for the following components:   WBC 11.2 (*)    All other components within normal limits  URINALYSIS, COMPLETE (UACMP)  WITH MICROSCOPIC - Abnormal; Notable for the following components:   Color, Urine AMBER (*)    APPearance HAZY (*)    Hgb urine dipstick MODERATE (*)    Protein, ur 30 (*)    RBC / HPF >50 (*)    Bacteria, UA MANY (*)    All other components within normal limits  LIPASE, BLOOD  POCT PREGNANCY, URINE   ____________________________________________  EKG  No indication for EKG ____________________________________________  RADIOLOGY I, Hinda Kehr, personally viewed and evaluated these images (plain radiographs) as part of my medical decision making, as well as reviewing the written report by the radiologist.  I discussed the MRI plan with Dr. Collins Scotland the radiologist.   ED MD interpretation: Pelvic ultrasound is unremarkable.  No acute abnormalities identified on MRIs of brain, C/T/L spines  Official radiology report(s): Mr Brain Wo Contrast  Result Date: 11/05/2018 CLINICAL DATA:  Numbness and weakness in both legs. Lower abdominal pain, back pain. EXAM: MRI HEAD WITHOUT CONTRAST TECHNIQUE: Multiplanar, multiecho pulse sequences of the brain and surrounding  structures were obtained without intravenous contrast. COMPARISON:  None. FINDINGS: BRAIN: There is no acute infarct, acute hemorrhage or extra-axial collection. The white matter signal is normal for the patient's age. The cerebral and cerebellar volume are age-appropriate. There is no hydrocephalus. The midline structures are normal. VASCULAR: The major intracranial arterial and venous sinus flow voids are normal. Susceptibility-sensitive sequences show no chronic microhemorrhage or superficial siderosis. SKULL AND UPPER CERVICAL SPINE: Calvarial bone marrow signal is normal. There is no skull base mass. The visualized upper cervical spine and soft tissues are normal. SINUSES/ORBITS: There are no fluid levels or advanced mucosal thickening. The mastoid air cells and middle ear cavities are free of fluid. The orbits are normal. IMPRESSION: Normal MRI of the brain. Electronically Signed   By: Ulyses Jarred M.D.   On: 11/05/2018 03:58   Mr Cervical Spine Wo Contrast  Result Date: 11/05/2018 CLINICAL DATA:  Back pain and lower extremity weakness and numbness. EXAM: MRI CERVICAL, THORACIC AND LUMBAR SPINE WITHOUT CONTRAST TECHNIQUE: Multiplanar and multiecho pulse sequences of the cervical spine, to include the craniocervical junction and cervicothoracic junction, and thoracic and lumbar spine, were obtained without intravenous contrast. COMPARISON:  None. FINDINGS: MRI CERVICAL SPINE FINDINGS Alignment: Physiologic. Vertebrae: No fracture, evidence of discitis, or bone lesion. Cord: Normal signal and morphology. Posterior Fossa, vertebral arteries, paraspinal tissues: Negative. Disc levels: No large disc herniation. No spinal canal or neural foraminal stenosis. MRI THORACIC SPINE FINDINGS Alignment:  Physiologic. Vertebrae: No fracture, evidence of discitis, or bone lesion. Cord:  Normal signal and morphology. Paraspinal and other soft tissues: Negative. Disc levels: No disc herniation or spinal canal stenosis. No  neural impingement. MRI LUMBAR SPINE FINDINGS Segmentation: Transitional anatomy. The lowest disc space is considered L6-S1. Alignment:  Physiologic. Vertebrae:  No fracture, evidence of discitis, or bone lesion. Conus medullaris and cauda equina: Conus extends to the L1 level. Conus and cauda equina appear normal. Paraspinal and other soft tissues: Negative Disc levels: No spinal canal or neural foraminal stenosis. At the L5-6 level, there is a small disc bulge with mild facet hypertrophy but no stenosis or neural impingement. IMPRESSION: 1. Normal cervical and thoracic spine. 2. 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 3. Minimal lower lumbar degenerative disc disease without spinal canal stenosis or neural impingement. Electronically Signed   By: Ulyses Jarred M.D.   On: 11/05/2018 04:06   Mr Thoracic Spine Wo Contrast  Result Date:  11/05/2018 CLINICAL DATA:  Back pain and lower extremity weakness and numbness. EXAM: MRI CERVICAL, THORACIC AND LUMBAR SPINE WITHOUT CONTRAST TECHNIQUE: Multiplanar and multiecho pulse sequences of the cervical spine, to include the craniocervical junction and cervicothoracic junction, and thoracic and lumbar spine, were obtained without intravenous contrast. COMPARISON:  None. FINDINGS: MRI CERVICAL SPINE FINDINGS Alignment: Physiologic. Vertebrae: No fracture, evidence of discitis, or bone lesion. Cord: Normal signal and morphology. Posterior Fossa, vertebral arteries, paraspinal tissues: Negative. Disc levels: No large disc herniation. No spinal canal or neural foraminal stenosis. MRI THORACIC SPINE FINDINGS Alignment:  Physiologic. Vertebrae: No fracture, evidence of discitis, or bone lesion. Cord:  Normal signal and morphology. Paraspinal and other soft tissues: Negative. Disc levels: No disc herniation or spinal canal stenosis. No neural impingement. MRI LUMBAR SPINE FINDINGS Segmentation: Transitional anatomy. The lowest disc space is  considered L6-S1. Alignment:  Physiologic. Vertebrae:  No fracture, evidence of discitis, or bone lesion. Conus medullaris and cauda equina: Conus extends to the L1 level. Conus and cauda equina appear normal. Paraspinal and other soft tissues: Negative Disc levels: No spinal canal or neural foraminal stenosis. At the L5-6 level, there is a small disc bulge with mild facet hypertrophy but no stenosis or neural impingement. IMPRESSION: 1. Normal cervical and thoracic spine. 2. 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 3. Minimal lower lumbar degenerative disc disease without spinal canal stenosis or neural impingement. Electronically Signed   By: Ulyses Jarred M.D.   On: 11/05/2018 04:06   Mr Lumbar Spine Wo Contrast  Result Date: 11/05/2018 CLINICAL DATA:  Back pain and lower extremity weakness and numbness. EXAM: MRI CERVICAL, THORACIC AND LUMBAR SPINE WITHOUT CONTRAST TECHNIQUE: Multiplanar and multiecho pulse sequences of the cervical spine, to include the craniocervical junction and cervicothoracic junction, and thoracic and lumbar spine, were obtained without intravenous contrast. COMPARISON:  None. FINDINGS: MRI CERVICAL SPINE FINDINGS Alignment: Physiologic. Vertebrae: No fracture, evidence of discitis, or bone lesion. Cord: Normal signal and morphology. Posterior Fossa, vertebral arteries, paraspinal tissues: Negative. Disc levels: No large disc herniation. No spinal canal or neural foraminal stenosis. MRI THORACIC SPINE FINDINGS Alignment:  Physiologic. Vertebrae: No fracture, evidence of discitis, or bone lesion. Cord:  Normal signal and morphology. Paraspinal and other soft tissues: Negative. Disc levels: No disc herniation or spinal canal stenosis. No neural impingement. MRI LUMBAR SPINE FINDINGS Segmentation: Transitional anatomy. The lowest disc space is considered L6-S1. Alignment:  Physiologic. Vertebrae:  No fracture, evidence of discitis, or bone lesion. Conus  medullaris and cauda equina: Conus extends to the L1 level. Conus and cauda equina appear normal. Paraspinal and other soft tissues: Negative Disc levels: No spinal canal or neural foraminal stenosis. At the L5-6 level, there is a small disc bulge with mild facet hypertrophy but no stenosis or neural impingement. IMPRESSION: 1. Normal cervical and thoracic spine. 2. 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 3. Minimal lower lumbar degenerative disc disease without spinal canal stenosis or neural impingement. Electronically Signed   By: Ulyses Jarred M.D.   On: 11/05/2018 04:06   US Pelvic Complete W Transvaginal And Torsion R/o  Result Date: 11/04/2018 CLINICAL DATA:  33 year old female presents with lower pelvic pain for 6 hours. LMP 11/04/2018. EXAM: TRANSABDOMINAL AND TRANSVAGINAL ULTRASOUND OF PELVIS DOPPLER ULTRASOUND OF OVARIES TECHNIQUE: Both transabdominal and transvaginal ultrasound examinations of the pelvis were performed. Transabdominal technique was performed for global imaging of the pelvis including uterus, ovaries, adnexal regions, and pelvic cul-de-sac. It was necessary to  proceed with endovaginal exam following the transabdominal exam to visualize the endometrium and adnexa. Color and duplex Doppler ultrasound was utilized to evaluate blood flow to the ovaries. COMPARISON:  05/02/2012 pelvic sonogram. FINDINGS: Uterus Measurements: 8.5 x 4.8 x 5.8 cm = volume: 122 mL. Anteverted mildly enlarged uterus with no uterine fibroids. Myometrial heterogeneity with focally indistinct endometrial-myometrial interface in the anterior uterine body may represent focal adenomyosis. Endometrium Thickness: 8 mm. Mildly heterogeneous endometrium. No convincing focal endometrial mass. Right ovary Measurements: 2.5 x 1.6 x 1.8 cm = volume: 4.0 mL. Normal appearance/no adnexal mass. Left ovary Measurements: 2.9 x 1.7 x 2.3 cm = volume: 6.0 mL. Normal appearance/no adnexal mass. Pulsed  Doppler evaluation of both ovaries demonstrates normal low-resistance arterial and venous waveforms. Other findings No abnormal free fluid. IMPRESSION: 1. No evidence of adnexal torsion. Normal ovaries. No adnexal masses. 2. Mildly enlarged anteverted uterus. Findings described above in the anterior uterine body may indicate focal adenomyosis. No uterine fibroids. Electronically Signed   By: Ilona Sorrel M.D.   On: 11/04/2018 20:19    ____________________________________________   PROCEDURES   Procedure(s) performed (including Critical Care):  Procedures   ____________________________________________   INITIAL IMPRESSION / MDM / [place] / ED COURSE  As part of my medical decision making, I reviewed the following data within the [place] notes reviewed and incorporated, Labs reviewed , Old chart reviewed, Discussed with radiologist, Notes from prior ED visits and [place] Controlled Substance Database   Differential diagnosis includes, but is not limited to, nonspecific musculoskeletal pain, multiple sclerosis, discitis, osteomyelitis, transverse myelitis, cauda equina syndrome, renal/ureteral colic, UTI/pyelonephritis.  The patient's vital signs are stable and within normal limits.  Lab work is essentially normal with only a very mild leukocytosis.  Urinalysis only shows blood which is consistent with her menstrual cycle currently being active.  Pelvic ultrasound was ordered in triage and does not show any sign of acute or emergent abnormalities.  The patient is here for back pain that is radiating down both legs with numbness and weakness.  Given the neurological findings I called and spoke with Dr. Collins Scotland with neuroradiology.  We discussed the broad differential diagnosis and he recommended getting MRIs of the brain, cervical spine, thoracic spine, and lumbar spine without contrast.  He felt that would be sufficient in the emergency department setting to  identify any acute abnormalities and if additional imaging with contrast was needed then we could perform a more targeted study.  The patient is in agreement with the plan and I am giving her morphine 4 mg IV and Zofran 4 mg IV for her acute pain.  I will reassess once the imaging is available.      Clinical Course as of Nov 05 506  Sat Nov 05, 2018  0506 MRIs were reassuring with no evidence of acute or emergent medical condition and nothing to explain her symptoms.  She has no signs or symptoms of infection including in the urinalysis was just demonstrates blood.  I will not start her on antibiotics and I encouraged her to follow-up with Dr. Glennon Mac with OB/GYN.  She understands and agrees with the plan and I gave my usual and customary return precautions as well.   [CF]    Clinical Course User Index [CF] Hinda Kehr, MD     ____________________________________________  FINAL CLINICAL IMPRESSION(S) / ED DIAGNOSES  Final diagnoses:  Pelvic pain  Acute bilateral low back pain with bilateral sciatica  Vaginal bleeding  MEDICATIONS GIVEN DURING THIS VISIT:  Medications  sodium chloride flush (NS) 0.9 % injection 3 mL (has no administration in time range)  morphine 4 MG/ML injection 4 mg (4 mg Intravenous Given 11/05/18 0122)  ondansetron (ZOFRAN) injection 4 mg (4 mg Intravenous Given 11/05/18 0122)     ED Discharge Orders    None      *Please note:  Sandra Castaneda was evaluated in Emergency Department on 11/05/2018 for the symptoms described in the history of present illness. She was evaluated in the context of the global COVID-19 pandemic, which necessitated consideration that the patient might be at risk for infection with the SARS-CoV-2 virus that causes COVID-19. Institutional protocols and algorithms that pertain to the evaluation of patients at risk for COVID-19 are in a state of rapid change based on information released by regulatory bodies including the CDC and  federal and state organizations. These policies and algorithms were followed during the patient's care in the ED.  Some ED evaluations and interventions may be delayed as a result of limited staffing during the pandemic.*  Note:  This document was prepared using Dragon voice recognition software and may include unintentional dictation errors.   Hinda Kehr, MD 11/05/18 [phone]

## 2018-11-05 NOTE — Discharge Instructions (Signed)
Your workup in the Emergency Department today was reassuring.  We did not find any specific abnormalities.  We recommend you drink plenty of fluids, take your regular medications and/or any new ones prescribed today, and follow up with the doctor(s) listed in these documents as recommended.  Return to the Emergency Department if you develop new or worsening symptoms that concern you.  

## 2018-11-05 NOTE — ED Notes (Signed)
Patient transported to MRI 

## 2018-12-13 DIAGNOSIS — H52223 Regular astigmatism, bilateral: Secondary | ICD-10-CM | POA: Diagnosis not present

## 2019-01-13 ENCOUNTER — Other Ambulatory Visit: Payer: Self-pay

## 2019-01-13 ENCOUNTER — Ambulatory Visit (INDEPENDENT_AMBULATORY_CARE_PROVIDER_SITE_OTHER): Payer: 59 | Admitting: Internal Medicine

## 2019-01-13 ENCOUNTER — Encounter: Payer: Self-pay | Admitting: Internal Medicine

## 2019-01-13 VITALS — Ht 71.0 in | Wt 250.0 lb

## 2019-01-13 DIAGNOSIS — D649 Anemia, unspecified: Secondary | ICD-10-CM | POA: Insufficient documentation

## 2019-01-13 DIAGNOSIS — Z13220 Encounter for screening for lipoid disorders: Secondary | ICD-10-CM

## 2019-01-13 DIAGNOSIS — D72829 Elevated white blood cell count, unspecified: Secondary | ICD-10-CM | POA: Diagnosis not present

## 2019-01-13 DIAGNOSIS — M549 Dorsalgia, unspecified: Secondary | ICD-10-CM | POA: Diagnosis not present

## 2019-01-13 DIAGNOSIS — M545 Low back pain: Secondary | ICD-10-CM

## 2019-01-13 DIAGNOSIS — Z113 Encounter for screening for infections with a predominantly sexual mode of transmission: Secondary | ICD-10-CM | POA: Diagnosis not present

## 2019-01-13 DIAGNOSIS — F32A Depression, unspecified: Secondary | ICD-10-CM | POA: Insufficient documentation

## 2019-01-13 DIAGNOSIS — N62 Hypertrophy of breast: Secondary | ICD-10-CM | POA: Insufficient documentation

## 2019-01-13 DIAGNOSIS — R739 Hyperglycemia, unspecified: Secondary | ICD-10-CM

## 2019-01-13 DIAGNOSIS — E559 Vitamin D deficiency, unspecified: Secondary | ICD-10-CM | POA: Insufficient documentation

## 2019-01-13 DIAGNOSIS — G8929 Other chronic pain: Secondary | ICD-10-CM | POA: Insufficient documentation

## 2019-01-13 DIAGNOSIS — F329 Major depressive disorder, single episode, unspecified: Secondary | ICD-10-CM

## 2019-01-13 DIAGNOSIS — E876 Hypokalemia: Secondary | ICD-10-CM

## 2019-01-13 DIAGNOSIS — Z1329 Encounter for screening for other suspected endocrine disorder: Secondary | ICD-10-CM

## 2019-01-13 DIAGNOSIS — F419 Anxiety disorder, unspecified: Secondary | ICD-10-CM | POA: Diagnosis not present

## 2019-01-13 DIAGNOSIS — E611 Iron deficiency: Secondary | ICD-10-CM

## 2019-01-13 DIAGNOSIS — Z1389 Encounter for screening for other disorder: Secondary | ICD-10-CM

## 2019-01-13 MED ORDER — DULOXETINE HCL 20 MG PO CPEP: 20.0000 mg | ORAL_CAPSULE | Freq: Every day | ORAL | 1 refills | Status: DC

## 2019-01-13 NOTE — Progress Notes (Signed)
Virtual Visit via Video Note  I connected with Sandra Castaneda   on 01/13/19 at  3:20 PM EST by a video enabled telemedicine application and verified that I am speaking with the correct person using two identifiers.  Location patient: car Location provider:work or home office Persons participating in the virtual visit: patient, provider, pts son  I discussed the limitations of evaluation and management by telemedicine and the availability of in person appointments. The patient expressed understanding and agreed to proceed.   HPI: 1. Anxiety >depression - was on lexapro 10 mg w/o help and zoloft in the past made her feel like a zombie  2.  Chronic mid and low back pain without sciatica nothing tried she thinks its due to arge breasts pt to call back with name of plastic surgery she wants referral for breast reduction 3.  Vitamin D deficiency, h/o anemia and leukocytosis will recheck labs    ROS: See pertinent positives and negatives per HPI. General: wt stable HEENT: nl hearing  CV: no chest pain Lungs: no sob GI: no ab pain  GU: no issues  MSK: chronic pain  Neuro: no h/a  Psych: +anxiety>depression  Skin: no issues   Past Medical History:  Diagnosis Date  . Anemia   . Anxiety and depression    lexapro 10 mg no help, zoloft like zombie  . BRCA negative    per pt report, done through [facility]  . Chronic low back pain    11/05/2018 MRI L spine 3. 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 Heavy menses   . Increased risk of breast cancer    IBIS=25.3%  . Large breasts   . Ovarian cyst 2007    Past Surgical History:  Procedure Laterality Date  . CESAREAN SECTION  2008   FTP  . CESAREAN SECTION WITH BILATERAL TUBAL LIGATION N/A 02/04/2016   Procedure: CESAREAN SECTION WITH BILATERAL TUBAL LIGATION;  Surgeon: Will Bonnet, MD;  Location: [facility] ORS;  Service: Obstetrics;  Laterality: N/A;  . LAPAROTOMY  2007   ruptured ovarian cyst Dr. Ammie Dalton      Family History  Problem Relation Age of Onset  . Brain cancer Mother   . Hypertension Mother   . Ovarian cancer Mother 91  . Lung cancer Mother   . Cancer Mother        ovarian to lung to brain  . Diabetes Mellitus I Father   . Pancreatic cancer Father 43  . Cancer Father        stomach cancer  . Diabetes Father   . Hypertension Brother   . Breast cancer Maternal Aunt 36  . Diabetes Mellitus II Paternal Aunt   . Diabetes Mellitus II Paternal Grandmother   . Breast cancer Maternal Aunt 60  . Anxiety disorder Brother     SOCIAL HX: DPR brother Bianney [place] [phone] y.o [place] as of 01/13/19 2 y.o daughter as of 01/13/2019  Single  4 brothers  Smoker as of 01/13/19  Works [employer]    Current Outpatient Medications:  .  levonorgestrel-ethinyl estradiol (SEASONALE) 0.15-0.03 MG tablet, Take 1 tablet by mouth daily., Disp: 1 Package, Rfl: 3 .  DULoxetine (CYMBALTA) 20 MG capsule, Take 1 capsule (20 mg total) by mouth daily., Disp: 90 capsule, Rfl: 1  EXAM:  VITALS per patient if applicable:  GENERAL: alert, oriented, appears well and in no acute distress  HEENT: atraumatic, conjunttiva clear, no obvious abnormalities on inspection of external nose  and ears  NECK: normal movements of the head and neck  LUNGS: on inspection no signs of respiratory distress, breathing rate appears normal, no obvious gross SOB, gasping or wheezing  CV: no obvious cyanosis  MS: moves all visible extremities without noticeable abnormality  PSYCH/NEURO: pleasant and cooperative, no obvious depression or anxiety, speech and thought processing grossly intact  ASSESSMENT AND PLAN:  Discussed the following assessment and plan:  Anxiety and depression - Plan: DULoxetine (CYMBALTA) 20 MG capsule consider increase in the future  Consider therapy in the future   Chronic midline low back pain without sciatica - Plan: Ambulatory referral to Physical Medicine Rehab  Large  breasts pt to call back with name of plastic surgery she wants referral  Chronic mid back pain - Plan: Ambulatory referral to Physical Medicine Rehab  Vitamin D deficiency - Plan: Vitamin D (25 hydroxy)  HM Flu shot utd  Tdap utd Pap neg 09/19/18 except trich re check; per pt had HPV vaccine  Smoker 1/4 th ppd tried chantix which made her cry  FH cancer mom and dad saw genetic counselor in 2012 inconclusive results/not enough knowledge consider 2nd consultation  rec healthy diet and exercise    -we discussed possible serious and likely etiologies, options for evaluation and workup, limitations of telemedicine visit vs in person visit, treatment, treatment risks and precautions. Pt prefers to treat via telemedicine empirically rather then risking or undertaking an in person visit at this moment. Patient agrees to seek prompt in person care if worsening, new symptoms arise, or if is not improving with treatment.   I discussed the assessment and treatment plan with the patient. The patient was provided an opportunity to ask questions and all were answered. The patient agreed with the plan and demonstrated an understanding of the instructions.   The patient was advised to call back or seek an in-person evaluation if the symptoms worsen or if the condition fails to improve as anticipated.  Time spent 30 minutes  Delorise Jackson, MD

## 2019-01-13 NOTE — Patient Instructions (Signed)
Back Exercises The following exercises strengthen the muscles that help to support the trunk and back. They also help to keep the lower back flexible. Doing these exercises can help to prevent back pain or lessen existing pain.  If you have back pain or discomfort, try doing these exercises 2-3 times each day or as told by your health care provider.  As your pain improves, do them once each day, but increase the number of times that you repeat the steps for each exercise (do more repetitions).  To prevent the recurrence of back pain, continue to do these exercises once each day or as told by your health care provider. Do exercises exactly as told by your health care provider and adjust them as directed. It is normal to feel mild stretching, pulling, tightness, or discomfort as you do these exercises, but you should stop right away if you feel sudden pain or your pain gets worse. Exercises Single knee to chest Repeat these steps 3-5 times for each leg: 1. Lie on your back on a firm bed or the floor with your legs extended. 2. Bring one knee to your chest. Your other leg should stay extended and in contact with the floor. 3. Hold your knee in place by grabbing your knee or thigh with both hands and hold. 4. Pull on your knee until you feel a gentle stretch in your lower back or buttocks. 5. Hold the stretch for 10-30 seconds. 6. Slowly release and straighten your leg. Pelvic tilt Repeat these steps 5-10 times: 1. Lie on your back on a firm bed or the floor with your legs extended. 2. Bend your knees so they are pointing toward the ceiling and your feet are flat on the floor. 3. Tighten your lower abdominal muscles to press your lower back against the floor. This motion will tilt your pelvis so your tailbone points up toward the ceiling instead of pointing to your feet or the floor. 4. With gentle tension and even breathing, hold this position for 5-10 seconds. Cat-cow Repeat these steps until  your lower back becomes more flexible: 1. Get into a hands-and-knees position on a firm surface. Keep your hands under your shoulders, and keep your knees under your hips. You may place padding under your knees for comfort. 2. Let your head hang down toward your chest. Contract your abdominal muscles and point your tailbone toward the floor so your lower back becomes rounded like the back of a cat. 3. Hold this position for 5 seconds. 4. Slowly lift your head, let your abdominal muscles relax and point your tailbone up toward the ceiling so your back forms a sagging arch like the back of a cow. 5. Hold this position for 5 seconds.  Press-ups Repeat these steps 5-10 times: 1. Lie on your abdomen (face-down) on the floor. 2. Place your palms near your head, about shoulder-width apart. 3. Keeping your back as relaxed as possible and keeping your hips on the floor, slowly straighten your arms to raise the top half of your body and lift your shoulders. Do not use your back muscles to raise your upper torso. You may adjust the placement of your hands to make yourself more comfortable. 4. Hold this position for 5 seconds while you keep your back relaxed. 5. Slowly return to lying flat on the floor.  Bridges Repeat these steps 10 times: 1. Lie on your back on a firm surface. 2. Bend your knees so they are pointing toward the ceiling and   your feet are flat on the floor. Your arms should be flat at your sides, next to your body. 3. Tighten your buttocks muscles and lift your buttocks off the floor until your waist is at almost the same height as your knees. You should feel the muscles working in your buttocks and the back of your thighs. If you do not feel these muscles, slide your feet 1-2 inches farther away from your buttocks. 4. Hold this position for 3-5 seconds. 5. Slowly lower your hips to the starting position, and allow your buttocks muscles to relax completely. If this exercise is too easy, try  doing it with your arms crossed over your chest. Abdominal crunches Repeat these steps 5-10 times: 1. Lie on your back on a firm bed or the floor with your legs extended. 2. Bend your knees so they are pointing toward the ceiling and your feet are flat on the floor. 3. Cross your arms over your chest. 4. Tip your chin slightly toward your chest without bending your neck. 5. Tighten your abdominal muscles and slowly raise your trunk (torso) high enough to lift your shoulder blades a tiny bit off the floor. Avoid raising your torso higher than that because it can put too much stress on your low back and does not help to strengthen your abdominal muscles. 6. Slowly return to your starting position. Back lifts Repeat these steps 5-10 times: 1. Lie on your abdomen (face-down) with your arms at your sides, and rest your forehead on the floor. 2. Tighten the muscles in your legs and your buttocks. 3. Slowly lift your chest off the floor while you keep your hips pressed to the floor. Keep the back of your head in line with the curve in your back. Your eyes should be looking at the floor. 4. Hold this position for 3-5 seconds. 5. Slowly return to your starting position. Contact a health care provider if:  Your back pain or discomfort gets much worse when you do an exercise.  Your worsening back pain or discomfort does not lessen within 2 hours after you exercise. If you have any of these problems, stop doing these exercises right away. Do not do them again unless your health care provider says that you can. Get help right away if:  You develop sudden, severe back pain. If this happens, stop doing the exercises right away. Do not do them again unless your health care provider says that you can. This information is not intended to replace advice given to you by your health care provider. Make sure you discuss any questions you have with your health care provider. Document Released: 02/27/2004 Document  Revised: 05/26/2018 Document Reviewed: 10/21/2017 Elsevier Patient Education  2020 [employer]  Living With Anxiety  After being diagnosed with an anxiety disorder, you may be relieved to know why you have felt or behaved a certain way. It is natural to also feel overwhelmed about the treatment ahead and what it will mean for your life. With care and support, you can manage this condition and recover from it. How to cope with anxiety Dealing with stress Stress is your body's reaction to life changes and events, both good and bad. Stress can last just a few hours or it can be ongoing. Stress can play a major role in anxiety, so it is important to learn both how to cope with stress and how to think about it differently. Talk with your health care provider or a counselor to learn more  about stress reduction. He or she may suggest some stress reduction techniques, such as:  Music therapy. This can include creating or listening to music that you enjoy and that inspires you.  Mindfulness-based meditation. This involves being aware of your normal breaths, rather than trying to control your breathing. It can be done while sitting or walking.  Centering prayer. This is a kind of meditation that involves focusing on a word, phrase, or sacred image that is meaningful to you and that brings you peace.  Deep breathing. To do this, expand your stomach and inhale slowly through your nose. Hold your breath for 3-5 seconds. Then exhale slowly, allowing your stomach muscles to relax.  Self-talk. This is a skill where you identify thought patterns that lead to anxiety reactions and correct those thoughts.  Muscle relaxation. This involves tensing muscles then relaxing them. Choose a stress reduction technique that fits your lifestyle and personality. Stress reduction techniques take time and practice. Set aside 5-15 minutes a day to do them. Therapists can offer training in these techniques. The training may  be covered by some insurance plans. Other things you can do to manage stress include:  Keeping a stress diary. This can help you learn what triggers your stress and ways to control your response.  Thinking about how you respond to certain situations. You may not be able to control everything, but you can control your reaction.  Making time for activities that help you relax, and not feeling guilty about spending your time in this way. Therapy combined with coping and stress-reduction skills provides the best chance for successful treatment. Medicines Medicines can help ease symptoms. Medicines for anxiety include:  Anti-anxiety drugs.  Antidepressants.  Beta-blockers. Medicines may be used as the main treatment for anxiety disorder, along with therapy, or if other treatments are not working. Medicines should be prescribed by a health care provider. Relationships Relationships can play a big part in helping you recover. Try to spend more time connecting with trusted friends and family members. Consider going to couples counseling, taking family education classes, or going to family therapy. Therapy can help you and others better understand the condition. How to recognize changes in your condition Everyone has a different response to treatment for anxiety. Recovery from anxiety happens when symptoms decrease and stop interfering with your daily activities at home or work. This may mean that you will start to:  Have better concentration and focus.  Sleep better.  Be less irritable.  Have more energy.  Have improved memory. It is important to recognize when your condition is getting worse. Contact your health care provider if your symptoms interfere with home or work and you do not feel like your condition is improving. Where to find help and support: You can get help and support from these sources:  Self-help groups.  Online and [employer].  A trusted spiritual  leader.  Couples counseling.  Family education classes.  Family therapy. Follow these instructions at home:  Eat a healthy diet that includes plenty of vegetables, fruits, whole grains, low-fat dairy products, and lean protein. Do not eat a lot of foods that are high in solid fats, added sugars, or salt.  Exercise. Most adults should do the following: ? Exercise for at least 150 minutes each week. The exercise should increase your heart rate and make you sweat (moderate-intensity exercise). ? Strengthening exercises at least twice a week.  Cut down on caffeine, tobacco, alcohol, and other potentially harmful substances.  Get  the right amount and quality of sleep. Most adults need 7-9 hours of sleep each night.  Make choices that simplify your life.  Take over-the-counter and prescription medicines only as told by your health care provider.  Avoid caffeine, alcohol, and certain over-the-counter cold medicines. These may make you feel worse. Ask your pharmacist which medicines to avoid.  Keep all follow-up visits as told by your health care provider. This is important. Questions to ask your health care provider  Would I benefit from therapy?  How often should I follow up with a health care provider?  How long do I need to take medicine?  Are there any long-term side effects of my medicine?  Are there any alternatives to taking medicine? Contact a health care provider if:  You have a hard time staying focused or finishing daily tasks.  You spend many hours a day feeling worried about everyday life.  You become exhausted by worry.  You start to have headaches, feel tense, or have nausea.  You urinate more than normal.  You have diarrhea. Get help right away if:  You have a racing heart and shortness of breath.  You have thoughts of hurting yourself or others. If you ever feel like you may hurt yourself or others, or have thoughts about taking your own life, get help  right away. You can go to your nearest emergency department or call:  Your local emergency services (911 in the [place]  A suicide crisis helpline, such as the National Suicide Prevention Lifeline at (607)212-4609. This is open 24-hours a day. Summary  Taking steps to deal with stress can help calm you.  Medicines cannot cure anxiety disorders, but they can help ease symptoms.  Family, friends, and partners can play a big part in helping you recover from an anxiety disorder. This information is not intended to replace advice given to you by your health care provider. Make sure you discuss any questions you have with your health care provider. Document Released: 01/14/2016 Document Revised: 01/01/2017 Document Reviewed: 01/14/2016 Elsevier Patient Education  2020 [employer] Inc[employer]  Duloxetine delayed-release capsules What is this medicine? DULOXETINE (doo LOX e teen) is used to treat depression, anxiety, and different types of chronic pain. This medicine may be used for other purposes; ask your health care provider or pharmacist if you have questions. COMMON BRAND NAME(S): Cymbalta, Murrell Converse, Irenka What should I tell my health care provider before I take this medicine? They need to know if you have any of these conditions:  bipolar disorder  glaucoma  high blood pressure  kidney disease  liver disease  seizures  suicidal thoughts, plans or attempt; a previous suicide attempt by you or a family member  take medicines that treat or prevent blood clots  taken medicines called MAOIs like Carbex, Eldepryl, Marplan, Nardil, and Parnate within 14 days  trouble passing urine  an unusual reaction to duloxetine, other medicines, foods, dyes, or preservatives  pregnant or trying to get pregnant  breast-feeding How should I use this medicine? Take this medicine by mouth with a glass of water. Follow the directions on the prescription label. Do not crush, cut or chew some capsules  of this medicine. Some capsules may be opened and sprinkled on applesauce. Check with your doctor or pharmacist if you are not sure. You can take this medicine with or without food. Take your medicine at regular intervals. Do not take your medicine more often than directed. Do not stop taking this medicine suddenly except  upon the advice of your doctor. Stopping this medicine too quickly may cause serious side effects or your condition may worsen. A special MedGuide will be given to you by the pharmacist with each prescription and refill. Be sure to read this information carefully each time. Talk to your pediatrician regarding the use of this medicine in children. While this drug may be prescribed for children as young as 577 years of age for selected conditions, precautions do apply. Overdosage: If you think you have taken too much of this medicine contact a poison control center or emergency room at once. NOTE: This medicine is only for you. Do not share this medicine with others. What if I miss a dose? If you miss a dose, take it as soon as you can. If it is almost time for your next dose, take only that dose. Do not take double or extra doses. What may interact with this medicine? Do not take this medicine with any of the following medications:  desvenlafaxine  levomilnacipran  linezolid  MAOIs like Carbex, Eldepryl, Marplan, Nardil, and Parnate  methylene blue (injected into a vein)  milnacipran  thioridazine  venlafaxine This medicine may also interact with the following medications:  alcohol  amphetamines  aspirin and aspirin-like medicines  certain antibiotics like ciprofloxacin and enoxacin  certain medicines for blood pressure, heart disease, irregular heart beat  certain medicines for depression, anxiety, or psychotic disturbances  certain medicines for migraine headache like almotriptan, eletriptan, frovatriptan, naratriptan, rizatriptan, sumatriptan, zolmitriptan   certain medicines that treat or prevent blood clots like warfarin, enoxaparin, and dalteparin  cimetidine  fentanyl  lithium  NSAIDS, medicines for pain and inflammation, like ibuprofen or naproxen  phentermine  procarbazine  rasagiline  sibutramine  St. John's wort  theophylline  tramadol  tryptophan This list may not describe all possible interactions. Give your health care provider a list of all the medicines, herbs, non-prescription drugs, or dietary supplements you use. Also tell them if you smoke, drink alcohol, or use illegal drugs. Some items may interact with your medicine. What should I watch for while using this medicine? Tell your doctor if your symptoms do not get better or if they get worse. Visit your doctor or healthcare provider for regular checks on your progress. Because it may take several weeks to see the full effects of this medicine, it is important to continue your treatment as prescribed by your doctor. This medicine may cause serious skin reactions. They can happen weeks to months after starting the medicine. Contact your healthcare provider right away if you notice fevers or flu-like symptoms with a rash. The rash may be red or purple and then turn into blisters or peeling of the skin. Or, you might notice a red rash with swelling of the face, lips, or lymph nodes in your neck or under your arms. Patients and their families should watch out for new or worsening thoughts of suicide or depression. Also watch out for sudden changes in feelings such as feeling anxious, agitated, panicky, irritable, hostile, aggressive, impulsive, severely restless, overly excited and hyperactive, or not being able to sleep. If this happens, especially at the beginning of treatment or after a change in dose, call your healthcare provider. You may get drowsy or dizzy. Do not drive, use machinery, or do anything that needs mental alertness until you know how this medicine affects  you. Do not stand or sit up quickly, especially if you are an older patient. This reduces the risk of dizzy  or fainting spells. Alcohol may interfere with the effect of this medicine. Avoid alcoholic drinks. This medicine can cause an increase in blood pressure. This medicine can also cause a sudden drop in your blood pressure, which may make you feel faint and increase the chance of a fall. These effects are most common when you first start the medicine or when the dose is increased, or during use of other medicines that can cause a sudden drop in blood pressure. Check with your doctor for instructions on monitoring your blood pressure while taking this medicine. Your mouth may get dry. Chewing sugarless gum or sucking hard candy, and drinking plenty of water, may help. Contact your doctor if the problem does not go away or is severe. What side effects may I notice from receiving this medicine? Side effects that you should report to your doctor or health care professional as soon as possible:  allergic reactions like skin rash, itching or hives, swelling of the face, lips, or tongue  anxious  breathing problems  confusion  changes in vision  chest pain  confusion  elevated mood, decreased need for sleep, racing thoughts, impulsive behavior  eye pain  fast, irregular heartbeat  feeling faint or lightheaded, falls  feeling agitated, angry, or irritable  hallucination, loss of contact with reality  high blood pressure  loss of balance or coordination  palpitations  redness, blistering, peeling or loosening of the skin, including inside the mouth  restlessness, pacing, inability to keep still  seizures  stiff muscles  suicidal thoughts or other mood changes  trouble passing urine or change in the amount of urine  trouble sleeping  unusual bleeding or bruising  unusually weak or tired  vomiting  yellowing of the eyes or skin Side effects that usually do not  require medical attention (report to your doctor or health care professional if they continue or are bothersome):  change in sex drive or performance  change in appetite or weight  constipation  dizziness  dry mouth  headache  increased sweating  nausea  tired This list may not describe all possible side effects. Call your doctor for medical advice about side effects. You may report side effects to FDA at 1-800-FDA-1088. Where should I keep my medicine? Keep out of the reach of children. Store at room temperature between 15 and 30 degrees C (59 to 86 degrees F). Throw away any unused medicine after the expiration date. NOTE: This sheet is a summary. It may not cover all possible information. If you have questions about this medicine, talk to your doctor, pharmacist, or health care provider.  2020 Elsevier/Gold Standard (2018-04-21 13:47:50)  Mindfulness-Based Stress Reduction Mindfulness-based stress reduction (MBSR) is a program that helps people learn to practice mindfulness. Mindfulness is the practice of intentionally paying attention to the present moment. It can be learned and practiced through techniques such as education, breathing exercises, meditation, and yoga. MBSR includes several mindfulness techniques in one program. MBSR works best when you understand the treatment, are willing to try new things, and can commit to spending time practicing what you learn. MBSR training may include learning about:  How your emotions, thoughts, and reactions affect your body.  New ways to respond to things that cause negative thoughts to start (triggers).  How to notice your thoughts and let go of them.  Practicing awareness of everyday things that you normally do without thinking.  The techniques and goals of different types of meditation. What are the benefits of MBSR?  MBSR can have many benefits, which include helping you to:  Develop self-awareness. This refers to knowing  and understanding yourself.  Learn skills and attitudes that help you to participate in your own health care.  Learn new ways to care for yourself.  Be more accepting about how things are, and let things go.  Be less judgmental and approach things with an open mind.  Be patient with yourself and trust yourself more. MBSR has also been shown to:  Reduce negative emotions, such as depression and anxiety.  Improve memory and focus.  Change how you sense and approach pain.  Boost your body's ability to fight infections.  Help you connect better with other people.  Improve your sense of well-being. Follow these instructions at home:   Find a local in-person or online MBSR program.  Set aside some time regularly for mindfulness practice.  Find a mindfulness practice that works best for you. This may include one or more of the following: ? Meditation. Meditation involves focusing your mind on a certain thought or activity. ? Breathing awareness exercises. These help you to stay present by focusing on your breath. ? Body scan. For this practice, you lie down and pay attention to each part of your body from head to toe. You can identify tension and soreness and intentionally relax parts of your body. ? Yoga. Yoga involves stretching and breathing, and it can improve your ability to move and be flexible. It can also provide an experience of testing your body's limits, which can help you release stress. ? Mindful eating. This way of eating involves focusing on the taste, texture, color, and smell of each bite of food. Because this slows down eating and helps you feel full sooner, it can be an important part of a weight-loss plan.  Find a podcast or recording that provides guidance for breathing awareness, body scan, or meditation exercises. You can listen to these any time when you have a free moment to rest without distractions.  Follow your treatment plan as told by your health care  provider. This may include taking regular medicines and making changes to your diet or lifestyle as recommended. How to practice mindfulness To do a basic awareness exercise:  Find a comfortable place to sit.  Pay attention to the present moment. Observe your thoughts, feelings, and surroundings just as they are.  Avoid placing judgment on yourself, your feelings, or your surroundings. Make note of any judgment that comes up, and let it go.  Your mind may wander, and that is okay. Make note of when your thoughts drift, and return your attention to the present moment. To do basic mindfulness meditation:  Find a comfortable place to sit. This may include a stable chair or a firm floor cushion. ? Sit upright with your back straight. Let your arms fall next to your side with your hands resting on your legs. ? If sitting in a chair, rest your feet flat on the floor. ? If sitting on a cushion, cross your legs in front of you.  Keep your head in a neutral position with your chin dropped slightly. Relax your jaw and rest the tip of your tongue on the roof of your mouth. Drop your gaze to the floor. You can close your eyes if you like.  Breathe normally and pay attention to your breath. Feel the air moving in and out of your nose. Feel your belly expanding and relaxing with each breath.  Your mind may wander,  and that is okay. Make note of when your thoughts drift, and return your attention to your breath.  Avoid placing judgment on yourself, your feelings, or your surroundings. Make note of any judgment or feelings that come up, let them go, and bring your attention back to your breath.  When you are ready, lift your gaze or open your eyes. Pay attention to how your body feels after the meditation. Where to find more information You can find more information about MBSR from:  Your health care provider.  Community-based meditation centers or programs.  Programs offered near you. Summary   Mindfulness-based stress reduction (MBSR) is a program that teaches you how to intentionally pay attention to the present moment. It is used with other treatments to help you cope better with daily stress, emotions, and pain.  MBSR focuses on developing self-awareness, which allows you to respond to life stress without judgment or negative emotions.  MBSR programs may involve learning different mindfulness practices, such as breathing exercises, meditation, yoga, body scan, or mindful eating. Find a mindfulness practice that works best for you, and set aside time for it on a regular basis. This information is not intended to replace advice given to you by your health care provider. Make sure you discuss any questions you have with your health care provider. Document Released: 05/28/2016 Document Revised: 01/01/2017 Document Reviewed: 05/28/2016 Elsevier Patient Education  2020 [employer].

## 2019-02-06 ENCOUNTER — Other Ambulatory Visit: Payer: Self-pay | Admitting: Internal Medicine

## 2019-02-06 ENCOUNTER — Other Ambulatory Visit (HOSPITAL_COMMUNITY)
Admission: RE | Admit: 2019-02-06 | Discharge: 2019-02-06 | Disposition: A | Discharge status: Home / Self Care | Payer: 59 | Source: Ambulatory Visit | Attending: Internal Medicine | Admitting: Internal Medicine

## 2019-02-06 ENCOUNTER — Other Ambulatory Visit: Payer: Self-pay

## 2019-02-06 ENCOUNTER — Other Ambulatory Visit (INDEPENDENT_AMBULATORY_CARE_PROVIDER_SITE_OTHER): Payer: 59

## 2019-02-06 DIAGNOSIS — Z113 Encounter for screening for infections with a predominantly sexual mode of transmission: Secondary | ICD-10-CM

## 2019-02-06 DIAGNOSIS — E876 Hypokalemia: Secondary | ICD-10-CM

## 2019-02-06 DIAGNOSIS — Z1389 Encounter for screening for other disorder: Secondary | ICD-10-CM | POA: Diagnosis not present

## 2019-02-06 DIAGNOSIS — E559 Vitamin D deficiency, unspecified: Secondary | ICD-10-CM

## 2019-02-06 DIAGNOSIS — R739 Hyperglycemia, unspecified: Secondary | ICD-10-CM | POA: Diagnosis not present

## 2019-02-06 DIAGNOSIS — Z13220 Encounter for screening for lipoid disorders: Secondary | ICD-10-CM | POA: Diagnosis not present

## 2019-02-06 DIAGNOSIS — E611 Iron deficiency: Secondary | ICD-10-CM | POA: Diagnosis not present

## 2019-02-06 DIAGNOSIS — Z1329 Encounter for screening for other suspected endocrine disorder: Secondary | ICD-10-CM

## 2019-02-06 DIAGNOSIS — D72829 Elevated white blood cell count, unspecified: Secondary | ICD-10-CM

## 2019-02-06 LAB — COMPREHENSIVE METABOLIC PANEL
ALT: 15 U/L (ref 0–35)
AST: 14 U/L (ref 0–37)
Albumin: 4.1 g/dL (ref 3.5–5.2)
Alkaline Phosphatase: 71 U/L (ref 39–117)
BUN: 9 mg/dL (ref 6–23)
CO2: 25 mEq/L (ref 19–32)
Calcium: 8.8 mg/dL (ref 8.4–10.5)
Chloride: 105 mEq/L (ref 96–112)
Creatinine, Ser: 0.69 mg/dL (ref 0.40–1.20)
GFR: 118.15 mL/min (ref >60.00)
Glucose, Bld: 95 mg/dL (ref 70–99)
Potassium: 3.7 mEq/L (ref 3.5–5.1)
Sodium: 140 mEq/L (ref 135–145)
Total Bilirubin: 0.3 mg/dL (ref 0.2–1.2)
Total Protein: 6.7 g/dL (ref 6.0–8.3)

## 2019-02-06 LAB — CBC WITH DIFFERENTIAL/PLATELET
Basophils Absolute: 0.1 10*3/uL (ref 0.0–0.1)
Basophils Relative: 0.5 % (ref 0.0–3.0)
Eosinophils Absolute: 0.2 10*3/uL (ref 0.0–0.7)
Eosinophils Relative: 1.5 % (ref 0.0–5.0)
HCT: 38.8 % (ref 36.0–46.0)
Hemoglobin: 13.1 g/dL (ref 12.0–15.0)
Lymphocytes Relative: 18.9 % (ref 12.0–46.0)
Lymphs Abs: 2 10*3/uL (ref 0.7–4.0)
MCHC: 33.9 g/dL (ref 30.0–36.0)
MCV: 90.4 fl (ref 78.0–100.0)
Monocytes Absolute: 0.8 10*3/uL (ref 0.1–1.0)
Monocytes Relative: 7.6 % (ref 3.0–12.0)
Neutro Abs: 7.7 10*3/uL (ref 1.4–7.7)
Neutrophils Relative %: 71.5 % (ref 43.0–77.0)
Platelets: 319 10*3/uL (ref 150.0–400.0)
RBC: 4.29 Mil/uL (ref 3.87–5.11)
RDW: 12.9 % (ref 11.5–15.5)
WBC: 10.8 10*3/uL — ABNORMAL HIGH (ref 4.0–10.5)

## 2019-02-06 LAB — LIPID PANEL
Cholesterol: 176 mg/dL (ref 0–200)
HDL: 33 mg/dL — ABNORMAL LOW (ref >39.00)
LDL Cholesterol: 113 mg/dL — ABNORMAL HIGH (ref 0–99)
NonHDL: 143.42
Total CHOL/HDL Ratio: 5
Triglycerides: 151 mg/dL — ABNORMAL HIGH (ref 0.0–149.0)
VLDL: 30.2 mg/dL (ref 0.0–40.0)

## 2019-02-06 LAB — HEMOGLOBIN A1C: Hgb A1c MFr Bld: 5.6 % (ref 4.6–6.5)

## 2019-02-06 LAB — TSH: TSH: 1.49 u[IU]/mL (ref 0.35–4.50)

## 2019-02-06 LAB — VITAMIN D 25 HYDROXY (VIT D DEFICIENCY, FRACTURES): VITD: 9.98 ng/mL — ABNORMAL LOW (ref 30.00–100.00)

## 2019-02-06 MED ORDER — CHOLECALCIFEROL 1.25 MG (50000 UT) PO CAPS: 50000.0000 [IU] | ORAL_CAPSULE | ORAL | 2 refills | Status: DC

## 2019-02-07 LAB — IRON,TIBC AND FERRITIN PANEL
%SAT: 38 % (calc) (ref 16–45)
Ferritin: 22 ng/mL (ref 16–154)
Iron: 120 ug/dL (ref 40–190)
TIBC: 319 mcg/dL (calc) (ref 250–450)

## 2019-02-07 LAB — URINALYSIS, ROUTINE W REFLEX MICROSCOPIC
Bilirubin Urine: NEGATIVE
Glucose, UA: NEGATIVE
Hgb urine dipstick: NEGATIVE
Leukocytes,Ua: NEGATIVE
Nitrite: NEGATIVE
Protein, ur: NEGATIVE
Specific Gravity, Urine: 1.03 (ref 1.001–1.03)
pH: 6.5 (ref 5.0–8.0)

## 2019-02-07 LAB — HIV ANTIBODY (ROUTINE TESTING W REFLEX): HIV 1&2 Ab, 4th Generation: NONREACTIVE

## 2019-02-08 ENCOUNTER — Other Ambulatory Visit: Payer: Self-pay | Admitting: Internal Medicine

## 2019-02-08 DIAGNOSIS — A599 Trichomoniasis, unspecified: Secondary | ICD-10-CM

## 2019-02-08 LAB — URINE CYTOLOGY ANCILLARY ONLY
Chlamydia: NEGATIVE
Comment: NEGATIVE
Comment: NEGATIVE
Comment: NORMAL
Neisseria Gonorrhea: NEGATIVE
Trichomonas: POSITIVE — AB

## 2019-02-08 MED ORDER — METRONIDAZOLE 500 MG PO TABS: 500.0000 mg | ORAL_TABLET | Freq: Two times a day (BID) | ORAL | 0 refills | Status: DC

## 2019-03-27 ENCOUNTER — Telehealth: Payer: Self-pay | Admitting: Plastic Surgery

## 2019-03-27 NOTE — Telephone Encounter (Signed)

## 2019-03-28 ENCOUNTER — Other Ambulatory Visit: Payer: Self-pay

## 2019-03-28 ENCOUNTER — Encounter: Payer: Self-pay | Admitting: Plastic Surgery

## 2019-03-28 ENCOUNTER — Ambulatory Visit (INDEPENDENT_AMBULATORY_CARE_PROVIDER_SITE_OTHER): Payer: 59 | Admitting: Plastic Surgery

## 2019-03-28 VITALS — BP 127/79 | HR 87 | Temp 98.4°F | Ht 70.0 in | Wt 252.0 lb

## 2019-03-28 DIAGNOSIS — M549 Dorsalgia, unspecified: Secondary | ICD-10-CM

## 2019-03-28 DIAGNOSIS — N62 Hypertrophy of breast: Secondary | ICD-10-CM | POA: Diagnosis not present

## 2019-03-28 DIAGNOSIS — G8929 Other chronic pain: Secondary | ICD-10-CM | POA: Diagnosis not present

## 2019-03-28 DIAGNOSIS — M545 Low back pain: Secondary | ICD-10-CM

## 2019-03-28 NOTE — Progress Notes (Signed)
Patient ID: Sandra Castaneda, female    DOB: 08-20-1985, 34 y.o.   MRN: [id number]   Chief Complaint  Patient presents with  . Breast Problem    Mammary Hyperplasia: The patient is a 34 y.o. female with a history of mammary hyperplasia for several years.  She has extremely large breasts causing symptoms that include the following: Back pain in the upper and lower back, including neck pain. She pulls or pins her bra straps to provide better lift and relief of the pressure and pain. She notices relief by holding her breast up manually.  Her shoulder straps cause grooves and pain and pressure that requires padding for relief. Pain medication is sometimes required with motrin and tylenol.  Activities that are hindered by enlarged breasts include: exercise and running.   Her breasts are extremely large and fairly symmetric.  She has hyperpigmentation of the inframammary area on both sides.  The sternal to nipple distance on the right is 39 cm and the left is 39 cm.  The IMF distance is 21 cm.  She is 5 feet 10 inches tall and weighs 252 pounds.  Preoperative bra size = 42 FF cup.  The estimated excess breast tissue to be removed at the time of surgery = 1000 grams on the left and 1000 grams on the right.  Mammogram history: Has not had a mammogram but will need 1 prior to surgery due to her very strong family history of cancer and a maternal aunt with breast cancer.  She had physical therapy at [facility].  It did not help her neck and back pain.  She was genetically tested for breast cancer in 2002 and the results were equivocal according to the patient.  The patient is a smoker.   Review of Systems  Constitutional: Positive for activity change. Negative for appetite change.  HENT: Negative.   Eyes: Negative.   Respiratory: Negative.  Negative for chest tightness and shortness of breath.   Cardiovascular: Negative.   Endocrine: Negative.   Genitourinary: Negative.   Musculoskeletal: Negative.     Skin: Positive for color change. Negative for wound.  Hematological: Negative.   Psychiatric/Behavioral: Negative.     Past Medical History:  Diagnosis Date  . Anemia   . Anxiety and depression    lexapro 10 mg no help, zoloft like zombie  . BRCA negative    per pt report, done through [facility]  . Chronic low back pain    11/05/2018 MRI L spine 3. 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 Heavy menses   . Increased risk of breast cancer    IBIS=25.3%  . Large breasts   . Ovarian cyst 2007    Past Surgical History:  Procedure Laterality Date  . CESAREAN SECTION  2008   FTP  . CESAREAN SECTION WITH BILATERAL TUBAL LIGATION N/A 02/04/2016   Procedure: CESAREAN SECTION WITH BILATERAL TUBAL LIGATION;  Surgeon: Will Bonnet, MD;  Location: [facility] ORS;  Service: Obstetrics;  Laterality: N/A;  . LAPAROTOMY  2007   ruptured ovarian cyst Dr. Ammie Dalton       Current Outpatient Medications:  .  Cholecalciferol 1.25 MG (50000 UT) capsule, Take 1 capsule (50,000 Units total) by mouth once a week., Disp: 13 capsule, Rfl: 2 .  DULoxetine (CYMBALTA) 20 MG capsule, Take 1 capsule (20 mg total) by mouth daily., Disp: 90 capsule, Rfl: 1 .  levonorgestrel-ethinyl estradiol (SEASONALE) 0.15-0.03 MG tablet, Take 1 tablet by mouth  daily., Disp: 1 Package, Rfl: 3 .  metroNIDAZOLE (FLAGYL) 500 MG tablet, Take 1 tablet (500 mg total) by mouth 2 (two) times daily. With food, Disp: 14 tablet, Rfl: 0   Objective:   Vitals:   03/28/19 1609  BP: 127/79  Pulse: 87  Temp: 98.4 F (36.9 C)  SpO2: 97%    Physical Exam Vitals and nursing note reviewed.  Constitutional:      Appearance: Normal appearance.  HENT:     Head: Normocephalic and atraumatic.  Eyes:     Extraocular Movements: Extraocular movements intact.     Pupils: Pupils are equal, round, and reactive to light.  Cardiovascular:     Rate and Rhythm: Normal rate.     Pulses: Normal pulses.  Pulmonary:      Effort: Pulmonary effort is normal.  Abdominal:     General: Abdomen is flat. There is no distension.     Tenderness: There is no abdominal tenderness.  Skin:    Capillary Refill: Capillary refill takes less than 2 seconds.  Neurological:     General: No focal deficit present.     Mental Status: She is alert and oriented to person, place, and time.  Psychiatric:        Mood and Affect: Mood normal.        Thought Content: Thought content normal.     Assessment & Plan:  Chronic midline low back pain without sciatica  Chronic mid back pain  Symptomatic mammary hypertrophy  Recommend bilateral breast reduction with possible liposuction. Will need to be tobacco free 6 weeks prior to any surgery. We have ordered a mammogram and will need that prior to scheduling. Risks and complications were discussed including pain, loss of nipple sensation and skin breakdown.  Also might not have the option for breast-feeding in the future. Lena, DO

## 2019-04-14 ENCOUNTER — Other Ambulatory Visit: Payer: Self-pay

## 2019-04-18 ENCOUNTER — Encounter: Payer: Self-pay | Admitting: Internal Medicine

## 2019-04-18 ENCOUNTER — Ambulatory Visit (INDEPENDENT_AMBULATORY_CARE_PROVIDER_SITE_OTHER): Payer: 59 | Admitting: Internal Medicine

## 2019-04-18 ENCOUNTER — Other Ambulatory Visit: Payer: Self-pay

## 2019-04-18 VITALS — BP 122/78 | HR 86 | Temp 96.1°F | Ht 70.0 in | Wt 255.6 lb

## 2019-04-18 DIAGNOSIS — D72829 Elevated white blood cell count, unspecified: Secondary | ICD-10-CM | POA: Diagnosis not present

## 2019-04-18 DIAGNOSIS — E559 Vitamin D deficiency, unspecified: Secondary | ICD-10-CM

## 2019-04-18 DIAGNOSIS — G8929 Other chronic pain: Secondary | ICD-10-CM | POA: Diagnosis not present

## 2019-04-18 DIAGNOSIS — A599 Trichomoniasis, unspecified: Secondary | ICD-10-CM

## 2019-04-18 DIAGNOSIS — Z0000 Encounter for general adult medical examination without abnormal findings: Secondary | ICD-10-CM | POA: Diagnosis not present

## 2019-04-18 DIAGNOSIS — E785 Hyperlipidemia, unspecified: Secondary | ICD-10-CM

## 2019-04-18 DIAGNOSIS — R5383 Other fatigue: Secondary | ICD-10-CM

## 2019-04-18 DIAGNOSIS — F329 Major depressive disorder, single episode, unspecified: Secondary | ICD-10-CM | POA: Diagnosis not present

## 2019-04-18 DIAGNOSIS — F419 Anxiety disorder, unspecified: Secondary | ICD-10-CM

## 2019-04-18 DIAGNOSIS — Z720 Tobacco use: Secondary | ICD-10-CM | POA: Diagnosis not present

## 2019-04-18 DIAGNOSIS — F32A Depression, unspecified: Secondary | ICD-10-CM

## 2019-04-18 DIAGNOSIS — R519 Headache, unspecified: Secondary | ICD-10-CM

## 2019-04-18 MED ORDER — DULOXETINE HCL 20 MG PO CPEP: 20.0000 mg | ORAL_CAPSULE | Freq: Every day | ORAL | 1 refills | Status: DC

## 2019-04-18 NOTE — Patient Instructions (Addendum)
[facility]   Thoracic Strain Rehab Ask your health care provider which exercises are safe for you. Do exercises exactly as told by your health care provider and adjust them as directed. It is normal to feel mild stretching, pulling, tightness, or discomfort as you do these exercises. Stop right away if you feel sudden pain or your pain gets worse. Do not begin these exercises until told by your health care provider. Stretching and range-of-motion exercise This exercise warms up your muscles and joints and improves the movement and flexibility of your back and shoulders. This exercise also helps to relieve pain. Chest and spine stretch  1. Lie down on your back on a firm surface. 2. Roll a towel or a small blanket so it is about 4 inches (10 cm) in diameter. 3. Put the towel lengthwise under the middle of your back so it is under your spine, but not under your shoulder blades. 4. Put your hands behind your head and let your elbows fall to your sides. This will increase your stretch. 5. Take a deep breath (inhale). 6. Hold for __________ seconds. 7. Relax after you breathe out (exhale). Repeat __________ times. Complete this exercise __________ times a day. Strengthening exercises These exercises build strength and endurance in your back and your shoulder blade muscles. Endurance is the ability to use your muscles for a long time, even after they get tired. Alternating arm and leg raises  1. Get on your hands and knees on a firm surface. If you are on a hard floor, you may want to use padding, such as an exercise mat, to cushion your knees. 2. Line up your arms and legs. Your hands should be directly below your shoulders, and your knees should be directly below your hips. 3. Lift your left leg behind you. At the same time, raise your right arm and straighten it in front of you. ? Do not lift your leg higher than your hip. ? Do not lift your arm higher than your shoulder. ? Keep your  abdominal and back muscles tight. ? Keep your hips facing the ground. ? Do not arch your back. ? Keep your balance carefully, and do not hold your breath. 4. Hold for __________ seconds. 5. Slowly return to the starting position and repeat with your right leg and your left arm. Repeat __________ times. Complete this exercise __________ times a day. Straight arm rows This exercise is also called shoulder extension exercise. 1. Stand with your feet shoulder width apart. 2. Secure an exercise band to a stable object in front of you so the band is at or above shoulder height. 3. Hold one end of the exercise band in each hand. 4. Straighten your elbows and lift your hands up to shoulder height. 5. Step back, away from the secured end of the exercise band, until the band stretches. 6. Squeeze your shoulder blades together and pull your hands down to the sides of your thighs. Stop when your hands are straight down by your sides. This is shoulder extension. Do not let your hands go behind your body. 7. Hold for __________ seconds. 8. Slowly return to the starting position. Repeat __________ times. Complete this exercise __________ times a day. Prone shoulder external rotation 1. Lie on your abdomen on a firm bed so your left / right forearm hangs over the edge of the bed and your upper arm is on the bed, straight out from your body. This is the prone position. ? Your  elbow should be bent. ? Your palm should be facing your feet. 2. If instructed, hold a __________ weight in your hand. 3. Squeeze your shoulder blade toward the middle of your back. Do not let your shoulder lift toward your ear. 4. Keep your elbow bent in a 90-degree angle (right angle) while you slowly move your forearm up toward the ceiling. Move your forearm up to the height of the bed, toward your head. This is external rotation. ? Your upper arm should not move. ? At the top of the movement, your palm should face the  floor. 5. Hold for __________ seconds. 6. Slowly return to the starting position and relax your muscles. Repeat __________ times. Complete this exercise __________ times a day. Rowing scapular retraction This is an exercise in which the shoulder blades (scapulae) are pulled toward each other (retraction). 1. Sit in a stable chair without armrests, or stand up. 2. Secure an exercise band to a stable object in front of you so the band is at shoulder height. 3. Hold one end of the exercise band in each hand. Your palms should face down. 4. Bring your arms out straight in front of you. 5. Step back, away from the secured end of the exercise band, until the band stretches. 6. Pull the band backward. As you do this, bend your elbows and squeeze your shoulder blades together, but avoid letting the rest of your body move. Do not shrug your shoulders upward while you do this. 7. Stop when your elbows are at your sides or slightly behind your body. 8. Hold for __________ seconds. 9. Slowly straighten your arms to return to the starting position. Repeat __________ times. Complete this exercise __________ times a day. Posture and body mechanics Good posture and healthy body mechanics can help to relieve stress in your body's tissues and joints. Body mechanics refers to the movements and positions of your body while you do your daily activities. Posture is part of body mechanics. Good posture means:  Your spine is in its natural S-curve position (neutral).  Your shoulders are pulled back slightly.  Your head is not tipped forward. Follow these guidelines to improve your posture and body mechanics in your everyday activities. Standing   When standing, keep your spine neutral and your feet about hip width apart. Keep a slight bend in your knees. Your ears, shoulders, and hips should line up with each other.  When you do a task in which you lean forward while standing in one place for a long time,  place one foot up on a stable object that is 2-4 inches (5-10 cm) high, such as a footstool. This helps keep your spine neutral. Sitting   When sitting, keep your spine neutral and keep your feet flat on the floor. Use a footrest, if necessary, and keep your thighs parallel to the floor. Avoid rounding your shoulders, and avoid tilting your head forward.  When working at a desk or a computer, keep your desk at a height where your hands are slightly lower than your elbows. Slide your chair under your desk so you are close enough to maintain good posture.  When working at a computer, place your monitor at a height where you are looking straight ahead and you do not have to tilt your head forward or downward to look at the screen. Resting When lying down and resting, avoid positions that are most painful for you.  If you have pain with activities such as sitting, bending,  stooping, or squatting (flexion-basedactivities), lie in a position in which your body does not bend very much. For example, avoid curling up on your side with your arms and knees near your chest (fetal position).  If you have pain with activities such as standing for a long time or reaching with your arms (extension-basedactivities), lie with your spine in a neutral position and bend your knees slightly. Try the following positions: ? Lie on your side with a pillow between your knees. ? Lie on your back with a pillow under your knees.  Lifting   When lifting objects, keep your feet at least shoulder width apart and tighten your abdominal muscles.  Bend your knees and hips and keep your spine neutral. It is important to lift using the strength of your legs, not your back. Do not lock your knees straight out.  Always ask for help to lift heavy or awkward objects. This information is not intended to replace advice given to you by your health care provider. Make sure you discuss any questions you have with your health care  provider. Document Revised: 05/13/2018 Document Reviewed: 02/28/2018 Elsevier Patient Education  2020 [employer] Back Exercises The following exercises strengthen the muscles that help to support the trunk and back. They also help to keep the lower back flexible. Doing these exercises can help to prevent back pain or lessen existing pain.  If you have back pain or discomfort, try doing these exercises 2-3 times each day or as told by your health care provider.  As your pain improves, do them once each day, but increase the number of times that you repeat the steps for each exercise (do more repetitions).  To prevent the recurrence of back pain, continue to do these exercises once each day or as told by your health care provider. Do exercises exactly as told by your health care provider and adjust them as directed. It is normal to feel mild stretching, pulling, tightness, or discomfort as you do these exercises, but you should stop right away if you feel sudden pain or your pain gets worse. Exercises Single knee to chest Repeat these steps 3-5 times for each leg: 1. Lie on your back on a firm bed or the floor with your legs extended. 2. Bring one knee to your chest. Your other leg should stay extended and in contact with the floor. 3. Hold your knee in place by grabbing your knee or thigh with both hands and hold. 4. Pull on your knee until you feel a gentle stretch in your lower back or buttocks. 5. Hold the stretch for 10-30 seconds. 6. Slowly release and straighten your leg. Pelvic tilt Repeat these steps 5-10 times: 1. Lie on your back on a firm bed or the floor with your legs extended. 2. Bend your knees so they are pointing toward the ceiling and your feet are flat on the floor. 3. Tighten your lower abdominal muscles to press your lower back against the floor. This motion will tilt your pelvis so your tailbone points up toward the ceiling instead of pointing to your feet or the  floor. 4. With gentle tension and even breathing, hold this position for 5-10 seconds. Cat-cow Repeat these steps until your lower back becomes more flexible: 1. Get into a hands-and-knees position on a firm surface. Keep your hands under your shoulders, and keep your knees under your hips. You may place padding under your knees for comfort. 2. Let your head hang down toward your  chest. Contract your abdominal muscles and point your tailbone toward the floor so your lower back becomes rounded like the back of a cat. 3. Hold this position for 5 seconds. 4. Slowly lift your head, let your abdominal muscles relax and point your tailbone up toward the ceiling so your back forms a sagging arch like the back of a cow. 5. Hold this position for 5 seconds.  Press-ups Repeat these steps 5-10 times: 1. Lie on your abdomen (face-down) on the floor. 2. Place your palms near your head, about shoulder-width apart. 3. Keeping your back as relaxed as possible and keeping your hips on the floor, slowly straighten your arms to raise the top half of your body and lift your shoulders. Do not use your back muscles to raise your upper torso. You may adjust the placement of your hands to make yourself more comfortable. 4. Hold this position for 5 seconds while you keep your back relaxed. 5. Slowly return to lying flat on the floor.  Bridges Repeat these steps 10 times: 1. Lie on your back on a firm surface. 2. Bend your knees so they are pointing toward the ceiling and your feet are flat on the floor. Your arms should be flat at your sides, next to your body. 3. Tighten your buttocks muscles and lift your buttocks off the floor until your waist is at almost the same height as your knees. You should feel the muscles working in your buttocks and the back of your thighs. If you do not feel these muscles, slide your feet 1-2 inches farther away from your buttocks. 4. Hold this position for 3-5 seconds. 5. Slowly lower  your hips to the starting position, and allow your buttocks muscles to relax completely. If this exercise is too easy, try doing it with your arms crossed over your chest. Abdominal crunches Repeat these steps 5-10 times: 1. Lie on your back on a firm bed or the floor with your legs extended. 2. Bend your knees so they are pointing toward the ceiling and your feet are flat on the floor. 3. Cross your arms over your chest. 4. Tip your chin slightly toward your chest without bending your neck. 5. Tighten your abdominal muscles and slowly raise your trunk (torso) high enough to lift your shoulder blades a tiny bit off the floor. Avoid raising your torso higher than that because it can put too much stress on your low back and does not help to strengthen your abdominal muscles. 6. Slowly return to your starting position. Back lifts Repeat these steps 5-10 times: 1. Lie on your abdomen (face-down) with your arms at your sides, and rest your forehead on the floor. 2. Tighten the muscles in your legs and your buttocks. 3. Slowly lift your chest off the floor while you keep your hips pressed to the floor. Keep the back of your head in line with the curve in your back. Your eyes should be looking at the floor. 4. Hold this position for 3-5 seconds. 5. Slowly return to your starting position. Contact a health care provider if:  Your back pain or discomfort gets much worse when you do an exercise.  Your worsening back pain or discomfort does not lessen within 2 hours after you exercise. If you have any of these problems, stop doing these exercises right away. Do not do them again unless your health care provider says that you can. Get help right away if:  You develop sudden, severe back pain. If  this happens, stop doing the exercises right away. Do not do them again unless your health care provider says that you can. This information is not intended to replace advice given to you by your health care  provider. Make sure you discuss any questions you have with your health care provider. Document Revised: 05/26/2018 Document Reviewed: 10/21/2017 Elsevier Patient Education  2020 [employer]  High Cholesterol  High cholesterol is a condition in which the blood has high levels of a white, waxy, fat-like substance (cholesterol). The human body needs small amounts of cholesterol. The liver makes all the cholesterol that the body needs. Extra (excess) cholesterol comes from the food that we eat. Cholesterol is carried from the liver by the blood through the blood vessels. If you have high cholesterol, deposits (plaques) may build up on the walls of your blood vessels (arteries). Plaques make the arteries narrower and stiffer. Cholesterol plaques increase your risk for heart attack and stroke. Work with your health care provider to keep your cholesterol levels in a healthy range. What increases the risk? This condition is more likely to develop in people who:  Eat foods that are high in animal fat (saturated fat) or cholesterol.  Are overweight.  Are not getting enough exercise.  Have a family history of high cholesterol. What are the signs or symptoms? There are no symptoms of this condition. How is this diagnosed? This condition may be diagnosed from the results of a blood test.  If you are older than age 37, your health care provider may check your cholesterol every 4-6 years.  You may be checked more often if you already have high cholesterol or other risk factors for heart disease. The blood test for cholesterol measures:  "Bad" cholesterol (LDL cholesterol). This is the main type of cholesterol that causes heart disease. The desired level for LDL is less than 100.  "Good" cholesterol (HDL cholesterol). This type helps to protect against heart disease by cleaning the arteries and carrying the LDL away. The desired level for HDL is 60 or higher.  Triglycerides. These are fats that  the body can store or burn for energy. The desired number for triglycerides is lower than 150.  Total cholesterol. This is a measure of the total amount of cholesterol in your blood, including LDL cholesterol, HDL cholesterol, and triglycerides. A healthy number is less than 200. How is this treated? This condition is treated with diet changes, lifestyle changes, and medicines. Diet changes  This may include eating more whole grains, fruits, vegetables, nuts, and fish.  This may also include cutting back on red meat and foods that have a lot of added sugar. Lifestyle changes  Changes may include getting at least 40 minutes of aerobic exercise 3 times a week. Aerobic exercises include walking, biking, and swimming. Aerobic exercise along with a healthy diet can help you maintain a healthy weight.  Changes may also include quitting smoking. Medicines  Medicines are usually given if diet and lifestyle changes have failed to reduce your cholesterol to healthy levels.  Your health care provider may prescribe a statin medicine. Statin medicines have been shown to reduce cholesterol, which can reduce the risk of heart disease. Follow these instructions at home: Eating and drinking If told by your health care provider:  Eat chicken (without skin), fish, veal, shellfish, ground [place] breast, and round or loin cuts of red meat.  Do not eat fried foods or fatty meats, such as hot dogs and salami.  Eat plenty  of fruits, such as apples.  Eat plenty of vegetables, such as broccoli, potatoes, and carrots.  Eat beans, peas, and lentils.  Eat grains such as barley, rice, couscous, and bulgur wheat.  Eat pasta without cream sauces.  Use skim or nonfat milk, and eat low-fat or nonfat yogurt and cheeses.  Do not eat or drink whole milk, cream, ice cream, egg yolks, or hard cheeses.  Do not eat stick margarine or tub margarines that contain trans fats (also called partially hydrogenated  oils).  Do not eat saturated tropical oils, such as coconut oil and palm oil.  Do not eat cakes, cookies, crackers, or other baked goods that contain trans fats.  General instructions  Exercise as directed by your health care provider. Increase your activity level with activities such as gardening, walking, and taking the stairs.  Take over-the-counter and prescription medicines only as told by your health care provider.  Do not use any products that contain nicotine or tobacco, such as cigarettes and e-cigarettes. If you need help quitting, ask your health care provider.  Keep all follow-up visits as told by your health care provider. This is important. Contact a health care provider if:  You are struggling to maintain a healthy diet or weight.  You need help to start on an exercise program.  You need help to stop smoking. Get help right away if:  You have chest pain.  You have trouble breathing. This information is not intended to replace advice given to you by your health care provider. Make sure you discuss any questions you have with your health care provider. Document Revised: 01/22/2017 Document Reviewed: 07/20/2015 Elsevier Patient Education  2020 [employer].  Cholesterol Content in Foods Cholesterol is a waxy, fat-like substance that helps to carry fat in the blood. The body needs cholesterol in small amounts, but too much cholesterol can cause damage to the arteries and heart. Most people should eat less than 200 milligrams (mg) of cholesterol a day. Foods with cholesterol  Cholesterol is found in animal-based foods, such as meat, seafood, and dairy. Generally, low-fat dairy and lean meats have less cholesterol than full-fat dairy and fatty meats. The milligrams of cholesterol per serving (mg per serving) of common cholesterol-containing foods are listed below. Meat and other proteins  Egg -- one large whole egg has 186 mg.  Veal shank -- 4 oz has 141 mg.  Lean  ground [place] (93% lean) -- 4 oz has 118 mg.  Fat-trimmed lamb loin -- 4 oz has 106 mg.  Lean ground beef (90% lean) -- 4 oz has 100 mg.  Lobster -- 3.5 oz has 90 mg.  Pork loin chops -- 4 oz has 86 mg.  Canned salmon -- 3.5 oz has 83 mg.  Fat-trimmed beef top loin -- 4 oz has 78 mg.  Frankfurter -- 1 frank (3.5 oz) has 77 mg.  Crab -- 3.5 oz has 71 mg.  Roasted chicken without skin, white meat -- 4 oz has 66 mg.  Light bologna -- 2 oz has 45 mg.  Deli-cut [place] -- 2 oz has 31 mg.  Canned tuna -- 3.5 oz has 31 mg.  Tomasa Blase -- 1 oz has 29 mg.  Oysters and mussels (raw) -- 3.5 oz has 25 mg.  Mackerel -- 1 oz has 22 mg.  Trout -- 1 oz has 20 mg.  Pork sausage -- 1 link (1 oz) has 17 mg.  Salmon -- 1 oz has 16 mg.  Tilapia -- 1 oz  has 14 mg. Dairy  Soft-serve ice cream --  cup (4 oz) has 103 mg.  Whole-milk yogurt -- 1 cup (8 oz) has 29 mg.  Cheddar cheese -- 1 oz has 28 mg.  American cheese -- 1 oz has 28 mg.  Whole milk -- 1 cup (8 oz) has 23 mg.  2% milk -- 1 cup (8 oz) has 18 mg.  Cream cheese -- 1 tablespoon (Tbsp) has 15 mg.  Cottage cheese --  cup (4 oz) has 14 mg.  Low-fat (1%) milk -- 1 cup (8 oz) has 10 mg.  Sour cream -- 1 Tbsp has 8.5 mg.  Low-fat yogurt -- 1 cup (8 oz) has 8 mg.  Nonfat Greek yogurt -- 1 cup (8 oz) has 7 mg.  Half-and-half cream -- 1 Tbsp has 5 mg. Fats and oils  Cod liver oil -- 1 tablespoon (Tbsp) has 82 mg.  Butter -- 1 Tbsp has 15 mg.  Lard -- 1 Tbsp has 14 mg.  Bacon grease -- 1 Tbsp has 14 mg.  Mayonnaise -- 1 Tbsp has 5-10 mg.  Margarine -- 1 Tbsp has 3-10 mg. Exact amounts of cholesterol in these foods may vary depending on specific ingredients and brands. Foods without cholesterol Most plant-based foods do not have cholesterol unless you combine them with a food that has cholesterol. Foods without cholesterol include:  Grains and cereals.  Vegetables.  Fruits.  Vegetable oils, such as olive,  canola, and sunflower oil.  Legumes, such as peas, beans, and lentils.  Nuts and seeds.  Egg whites. Summary  The body needs cholesterol in small amounts, but too much cholesterol can cause damage to the arteries and heart.  Most people should eat less than 200 milligrams (mg) of cholesterol a day. This information is not intended to replace advice given to you by your health care provider. Make sure you discuss any questions you have with your health care provider. Document Revised: 01/01/2017 Document Reviewed: 09/15/2016 Elsevier Patient Education  2020 [employer] Inc[employer]  Duloxetine delayed-release capsules What is this medicine? DULOXETINE (doo LOX e teen) is used to treat depression, anxiety, and different types of chronic pain. This medicine may be used for other purposes; ask your health care provider or pharmacist if you have questions. COMMON BRAND NAME(S): Cymbalta, Murrell Converserizalma, Irenka What should I tell my health care provider before I take this medicine? They need to know if you have any of these conditions:  bipolar disorder  glaucoma  high blood pressure  kidney disease  liver disease  seizures  suicidal thoughts, plans or attempt; a previous suicide attempt by you or a family member  take medicines that treat or prevent blood clots  taken medicines called MAOIs like Carbex, Eldepryl, Marplan, Nardil, and Parnate within 14 days  trouble passing urine  an unusual reaction to duloxetine, other medicines, foods, dyes, or preservatives  pregnant or trying to get pregnant  breast-feeding How should I use this medicine? Take this medicine by mouth with a glass of water. Follow the directions on the prescription label. Do not crush, cut or chew some capsules of this medicine. Some capsules may be opened and sprinkled on applesauce. Check with your doctor or pharmacist if you are not sure. You can take this medicine with or without food. Take your medicine at regular  intervals. Do not take your medicine more often than directed. Do not stop taking this medicine suddenly except upon the advice of your doctor. Stopping this medicine too quickly  may cause serious side effects or your condition may worsen. A special MedGuide will be given to you by the pharmacist with each prescription and refill. Be sure to read this information carefully each time. Talk to your pediatrician regarding the use of this medicine in children. While this drug may be prescribed for children as young as 55 years of age for selected conditions, precautions do apply. Overdosage: If you think you have taken too much of this medicine contact a poison control center or emergency room at once. NOTE: This medicine is only for you. Do not share this medicine with others. What if I miss a dose? If you miss a dose, take it as soon as you can. If it is almost time for your next dose, take only that dose. Do not take double or extra doses. What may interact with this medicine? Do not take this medicine with any of the following medications:  desvenlafaxine  levomilnacipran  linezolid  MAOIs like Carbex, Eldepryl, Marplan, Nardil, and Parnate  methylene blue (injected into a vein)  milnacipran  thioridazine  venlafaxine This medicine may also interact with the following medications:  alcohol  amphetamines  aspirin and aspirin-like medicines  certain antibiotics like ciprofloxacin and enoxacin  certain medicines for blood pressure, heart disease, irregular heart beat  certain medicines for depression, anxiety, or psychotic disturbances  certain medicines for migraine headache like almotriptan, eletriptan, frovatriptan, naratriptan, rizatriptan, sumatriptan, zolmitriptan  certain medicines that treat or prevent blood clots like warfarin, enoxaparin, and dalteparin  cimetidine  fentanyl  lithium  NSAIDS, medicines for pain and inflammation, like ibuprofen or  naproxen  phentermine  procarbazine  rasagiline  sibutramine  St. John's wort  theophylline  tramadol  tryptophan This list may not describe all possible interactions. Give your health care provider a list of all the medicines, herbs, non-prescription drugs, or dietary supplements you use. Also tell them if you smoke, drink alcohol, or use illegal drugs. Some items may interact with your medicine. What should I watch for while using this medicine? Tell your doctor if your symptoms do not get better or if they get worse. Visit your doctor or healthcare provider for regular checks on your progress. Because it may take several weeks to see the full effects of this medicine, it is important to continue your treatment as prescribed by your doctor. This medicine may cause serious skin reactions. They can happen weeks to months after starting the medicine. Contact your healthcare provider right away if you notice fevers or flu-like symptoms with a rash. The rash may be red or purple and then turn into blisters or peeling of the skin. Or, you might notice a red rash with swelling of the face, lips, or lymph nodes in your neck or under your arms. Patients and their families should watch out for new or worsening thoughts of suicide or depression. Also watch out for sudden changes in feelings such as feeling anxious, agitated, panicky, irritable, hostile, aggressive, impulsive, severely restless, overly excited and hyperactive, or not being able to sleep. If this happens, especially at the beginning of treatment or after a change in dose, call your healthcare provider. You may get drowsy or dizzy. Do not drive, use machinery, or do anything that needs mental alertness until you know how this medicine affects you. Do not stand or sit up quickly, especially if you are an older patient. This reduces the risk of dizzy or fainting spells. Alcohol may interfere with the effect of this medicine.  Avoid alcoholic  drinks. This medicine can cause an increase in blood pressure. This medicine can also cause a sudden drop in your blood pressure, which may make you feel faint and increase the chance of a fall. These effects are most common when you first start the medicine or when the dose is increased, or during use of other medicines that can cause a sudden drop in blood pressure. Check with your doctor for instructions on monitoring your blood pressure while taking this medicine. Your mouth may get dry. Chewing sugarless gum or sucking hard candy, and drinking plenty of water, may help. Contact your doctor if the problem does not go away or is severe. What side effects may I notice from receiving this medicine? Side effects that you should report to your doctor or health care professional as soon as possible:  allergic reactions like skin rash, itching or hives, swelling of the face, lips, or tongue  anxious  breathing problems  confusion  changes in vision  chest pain  confusion  elevated mood, decreased need for sleep, racing thoughts, impulsive behavior  eye pain  fast, irregular heartbeat  feeling faint or lightheaded, falls  feeling agitated, angry, or irritable  hallucination, loss of contact with reality  high blood pressure  loss of balance or coordination  palpitations  redness, blistering, peeling or loosening of the skin, including inside the mouth  restlessness, pacing, inability to keep still  seizures  stiff muscles  suicidal thoughts or other mood changes  trouble passing urine or change in the amount of urine  trouble sleeping  unusual bleeding or bruising  unusually weak or tired  vomiting  yellowing of the eyes or skin Side effects that usually do not require medical attention (report to your doctor or health care professional if they continue or are bothersome):  change in sex drive or performance  change in appetite or  weight  constipation  dizziness  dry mouth  headache  increased sweating  nausea  tired This list may not describe all possible side effects. Call your doctor for medical advice about side effects. You may report side effects to FDA at 1-800-FDA-1088. Where should I keep my medicine? Keep out of the reach of children. Store at room temperature between 15 and 30 degrees C (59 to 86 degrees F). Throw away any unused medicine after the expiration date. NOTE: This sheet is a summary. It may not cover all possible information. If you have questions about this medicine, talk to your doctor, pharmacist, or health care provider.  2020 Elsevier/Gold Standard (2018-04-21 13:47:50)

## 2019-04-18 NOTE — Progress Notes (Addendum)
Chief Complaint  Patient presents with  . Follow-up   Annual  1. Anxiety/depression/chronic pain in back did not start cymbalta 20 mg, agreeable to see therapy and wants to try cymbalta  2. Chronic mid and low back pain seen plastics and 04/26/19 mammo schedule and pending breast reduction late 05/2019 for 42 FF breast size  She never saw PM&R and wants to hold on referral until after breast reduction  3. Contraception stopped seasonale as she reports increased menstrual cramps and DUB and sx's resolved  4. Vitamin D def taking D3 50K IU weekly  5. Trichomonas completed flagyl and no longer sexually active  6. Tobacco abuse smoking <1/2 ppd rec cessation today explained this is why WBC likely sl elevated denies cavities/UTI sx's   Review of Systems  Constitutional: Negative for weight loss.  HENT: Negative for hearing loss.   Eyes: Negative for blurred vision.  Respiratory: Negative for shortness of breath.   Cardiovascular: Negative for chest pain.  Gastrointestinal: Negative for abdominal pain.  Musculoskeletal: Positive for back pain.  Skin: Negative for rash.  Neurological: Negative for headaches.  Psychiatric/Behavioral: Positive for depression. The patient is nervous/anxious.    Past Medical History:  Diagnosis Date  . Anemia   . Anxiety and depression    lexapro 10 mg no help, zoloft like zombie  . BRCA negative    per pt report, done through [facility]  . Chronic low back pain    11/05/2018 MRI L spine 3. 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 Heavy menses   . Increased risk of breast cancer    IBIS=25.3%  . Large breasts   . Ovarian cyst 2007   Past Surgical History:  Procedure Laterality Date  . CESAREAN SECTION  2008   FTP  . CESAREAN SECTION WITH BILATERAL TUBAL LIGATION N/A 02/04/2016   Procedure: CESAREAN SECTION WITH BILATERAL TUBAL LIGATION;  Surgeon: Will Bonnet, MD;  Location: [facility] ORS;  Service: Obstetrics;  Laterality: N/A;  .  LAPAROTOMY  2007   ruptured ovarian cyst Dr. Ammie Dalton    Family History  Problem Relation Age of Onset  . Brain cancer Mother   . Hypertension Mother   . Ovarian cancer Mother 66  . Lung cancer Mother   . Cancer Mother        ovarian to lung to brain  . Diabetes Mellitus I Father   . Pancreatic cancer Father 60  . Cancer Father        stomach cancer  . Diabetes Father   . Hypertension Brother   . Breast cancer Maternal Aunt 15  . Diabetes Mellitus II Paternal Aunt   . Diabetes Mellitus II Paternal Grandmother   . Breast cancer Maternal Aunt 63  . Anxiety disorder Brother    Social History   Socioeconomic History  . Marital status: Single    Spouse name: Not on file  . Number of children: Not on file  . Years of education: Not on file  . Highest education level: Not on file  Occupational History  . Not on file  Tobacco Use  . Smoking status: Current Every Day Smoker    Packs/day: 0.25  . Smokeless tobacco: Never Used  Substance and Sexual Activity  . Alcohol use: No  . Drug use: No  . Sexual activity: Yes    Partners: Male    Birth control/protection: Surgical  Other Topics Concern  . Not on file  Social History Narrative   DPR  brother Lachrista Heslin [phone]   81 y.o [place] as of 01/13/19   2 y.o daughter as of 01/13/2019    Single    4 brothers    Smoker as of 01/13/19    Works [employer]    Social Determinants of Health   Financial Resource Strain:   . Difficulty of Paying Living Expenses:   Food Insecurity:   . Worried About Charity fundraiser in the Last Year:   . Arboriculturist in the Last Year:   Transportation Needs:   . Film/video editor (Medical):   Marland Kitchen Lack of Transportation (Non-Medical):   Physical Activity:   . Days of Exercise per Week:   . Minutes of Exercise per Session:   Stress:   . Feeling of Stress :   Social Connections:   . Frequency of Communication with Friends and Family:   . Frequency of Social Gatherings  with Friends and Family:   . Attends Religious Services:   . Active Member of Clubs or Organizations:   . Attends Archivist Meetings:   Marland Kitchen Marital Status:   Intimate Partner Violence:   . Fear of Current or Ex-Partner:   . Emotionally Abused:   Marland Kitchen Physically Abused:   . Sexually Abused:    Current Meds  Medication Sig  . Cholecalciferol 1.25 MG (50000 UT) capsule Take 1 capsule (50,000 Units total) by mouth once a week.   Allergies  Allergen Reactions  . Chantix [Varenicline]     Crying   . Lexapro [Escitalopram]     No help   . Zoloft [Sertraline]     Zombie    Recent Results (from the past 2160 hour(s))  Urine cytology ancillary only([place])     Status: Abnormal   Collection Time: 02/06/19 10:25 AM  Result Value Ref Range   Neisseria Gonorrhea Negative    Chlamydia Negative    Trichomonas Positive (A)    Comment Normal Reference Range Trichomonas - Negative    Comment Normal Reference [place] Chlamydia - Negative    Comment      Normal Reference Range Neisseria Gonorrhea - Negative  HgB A1c     Status: None   Collection Time: 02/06/19 10:26 AM  Result Value Ref Range   Hgb A1c MFr Bld 5.6 4.6 - 6.5 %    Comment: Glycemic Control Guidelines for People with Diabetes:Non Diabetic:  <6%Goal of Therapy: <7%Additional Action Suggested:  >8%   Iron, TIBC and Ferritin Panel     Status: None   Collection Time: 02/06/19 10:26 AM  Result Value Ref Range   Iron 120 40 - 190 mcg/dL   TIBC 319 250 - 450 mcg/dL (calc)   %SAT 38 16 - 45 % (calc)   Ferritin 22 16 - 154 ng/mL  Vitamin D (25 hydroxy)     Status: Abnormal   Collection Time: 02/06/19 10:26 AM  Result Value Ref Range   VITD 9.98 (L) 30.00 - 100.00 ng/mL  HIV antibody (with reflex)     Status: None   Collection Time: 02/06/19 10:26 AM  Result Value Ref Range   HIV 1&2 Ab, 4th Generation NON-REACTIVE NON-REACTI    Comment: HIV-1 antigen and HIV-1/HIV-2 antibodies were not detected. There is no  laboratory evidence of HIV infection. Marland Kitchen PLEASE NOTE: This information has been disclosed to you from records whose confidentiality may be protected by state law.  If your state requires such protection, then the state law prohibits you  from making any further disclosure of the information without the specific written consent of the person to whom it pertains, or as otherwise permitted by law. A general authorization for the release of medical or other information is NOT sufficient for this purpose. . For additional information please refer to http://education.questdiagnostics.com/faq/FAQ106 (This link is being provided for informational/ educational purposes only.) . Marland Kitchen The performance of this assay has not been clinically validated in patients less than 56 years old. Marland Kitchen   Urinalysis, Routine w reflex microscopic     Status: Abnormal   Collection Time: 02/06/19 10:26 AM  Result Value Ref Range   Color, Urine DARK YELLOW YELLOW   APPearance TURBID (A) CLEAR   Specific Gravity, Urine 1.030 1.001 - 1.03   pH 6.5 5.0 - 8.0   Glucose, UA NEGATIVE NEGATIVE   Bilirubin Urine NEGATIVE NEGATIVE   Ketones, ur TRACE (A) NEGATIVE   Hgb urine dipstick NEGATIVE NEGATIVE   Protein, ur NEGATIVE NEGATIVE   Nitrite NEGATIVE NEGATIVE   Leukocytes,Ua NEGATIVE NEGATIVE  TSH     Status: None   Collection Time: 02/06/19 10:26 AM  Result Value Ref Range   TSH 1.49 0.35 - 4.50 uIU/mL  Lipid panel     Status: Abnormal   Collection Time: 02/06/19 10:26 AM  Result Value Ref Range   Cholesterol 176 0 - 200 mg/dL    Comment: ATP III Classification       Desirable:  < 200 mg/dL               Borderline High:  200 - 239 mg/dL          High:  > = 240 mg/dL   Triglycerides 151.0 (H) 0.0 - 149.0 mg/dL    Comment: Normal:  <150 mg/dLBorderline High:  150 - 199 mg/dL   HDL 33.00 (L) >39.00 mg/dL   VLDL 30.2 0.0 - 40.0 mg/dL   LDL Cholesterol 113 (H) 0 - 99 mg/dL   Total CHOL/HDL Ratio 5     Comment:                 Men          Women1/2 Average Risk     3.4          3.3Average Risk          5.0          4.42X Average Risk          9.6          7.13X Average Risk          15.0          11.0                       NonHDL 143.42     Comment: NOTE:  Non-HDL goal should be 30 mg/dL higher than patient's LDL goal (i.e. LDL goal of < 70 mg/dL, would have non-HDL goal of < 100 mg/dL)  CBC with Differential/Platelet     Status: Abnormal   Collection Time: 02/06/19 10:26 AM  Result Value Ref Range   WBC 10.8 (H) 4.0 - 10.5 K/uL   RBC 4.29 3.87 - 5.11 Mil/uL   Hemoglobin 13.1 12.0 - 15.0 g/dL   HCT 38.8 36.0 - 46.0 %   MCV 90.4 78.0 - 100.0 fl   MCHC 33.9 30.0 - 36.0 g/dL   RDW 12.9 11.5 - 15.5 %   Platelets 319.0 150.0 - 400.0 K/uL  Neutrophils Relative % 71.5 43.0 - 77.0 %   Lymphocytes Relative 18.9 12.0 - 46.0 %   Monocytes Relative 7.6 3.0 - 12.0 %   Eosinophils Relative 1.5 0.0 - 5.0 %   Basophils Relative 0.5 0.0 - 3.0 %   Neutro Abs 7.7 1.4 - 7.7 K/uL   Lymphs Abs 2.0 0.7 - 4.0 K/uL   Monocytes Absolute 0.8 0.1 - 1.0 K/uL   Eosinophils Absolute 0.2 0.0 - 0.7 K/uL   Basophils Absolute 0.1 0.0 - 0.1 K/uL  Comprehensive metabolic panel     Status: None   Collection Time: 02/06/19 10:26 AM  Result Value Ref Range   Sodium 140 135 - 145 mEq/L   Potassium 3.7 3.5 - 5.1 mEq/L   Chloride 105 96 - 112 mEq/L   CO2 25 19 - 32 mEq/L   Glucose, Bld 95 70 - 99 mg/dL   BUN 9 6 - 23 mg/dL   Creatinine, Ser 0.69 0.40 - 1.20 mg/dL   Total Bilirubin 0.3 0.2 - 1.2 mg/dL   Alkaline Phosphatase 71 39 - 117 U/L   AST 14 0 - 37 U/L   ALT 15 0 - 35 U/L   Total Protein 6.7 6.0 - 8.3 g/dL   Albumin 4.1 3.5 - 5.2 g/dL   GFR 118.15 >60.00 mL/min   Calcium 8.8 8.4 - 10.5 mg/dL   Objective  Body mass index is 36.67 kg/m. Wt Readings from Last 3 Encounters:  04/18/19 255 lb 9.6 oz (115.9 kg)  03/28/19 252 lb (114.3 kg)  01/13/19 250 lb (113.4 kg)   Temp Readings from Last 3 Encounters:  04/18/19 (!)  96.1 F (35.6 C) (Temporal)  03/28/19 98.4 F (36.9 C) (Temporal)  11/05/18 98.3 F (36.8 C)   BP Readings from Last 3 Encounters:  04/18/19 122/78  03/28/19 127/79  11/05/18 103/63   Pulse Readings from Last 3 Encounters:  04/18/19 86  03/28/19 87  11/05/18 (!) 57    Physical Exam Vitals and nursing note reviewed.  Constitutional:      Appearance: Normal appearance. She is well-developed and well-groomed. She is obese.  HENT:     Head: Normocephalic and atraumatic.  Eyes:     Conjunctiva/sclera: Conjunctivae normal.     Pupils: Pupils are equal, round, and reactive to light.  Cardiovascular:     Rate and Rhythm: Normal rate and regular rhythm.     Heart sounds: Normal heart sounds. No murmur.  Pulmonary:     Effort: Pulmonary effort is normal.     Breath sounds: Normal breath sounds.  Abdominal:     General: Abdomen is flat. Bowel sounds are normal.  Musculoskeletal:     Thoracic back: Tenderness present.     Lumbar back: Tenderness present.  Skin:    General: Skin is warm and dry.  Neurological:     General: No focal deficit present.     Mental Status: She is alert and oriented to person, place, and time. Mental status is at baseline.     Gait: Gait normal.  Psychiatric:        Attention and Perception: Attention and perception normal.        Mood and Affect: Mood and affect normal.        Speech: Speech normal.        Behavior: Behavior normal. Behavior is cooperative.        Thought Content: Thought content normal.        Cognition and Memory: Cognition and memory  normal.        Judgment: Judgment normal.     Assessment  Plan  Annual physical exam Flu shot utd  Tdap utd covid 19 vaccine not sure if will get Pap neg 09/19/18 except trich re check; per pt had HPV vaccine  -off seasonale due to DUB/cramping Smoker 1/4 th to < 1/2 ppd tried chantix which made her cry  -rec cessation FH cancer mom and dad saw genetic counselor in 2012 inconclusive  results/not enough knowledge consider 2nd consultation  rec healthy diet and exercise    Anxiety and depression - Plan: DULoxetine (CYMBALTA) 20 MG capsule Referral [facility]  Other chronic pain - Plan: DULoxetine (CYMBALTA) 20 MG capsule  Vitamin D deficiency - Plan: Vitamin D (25 hydroxy)  Trichomoniasis - Plan: Urine cytology ancillary only([place])  Tobacco abuse  Leukocytosis, unspecified type - Plan: CBC with Differential/Platelet  Hyperlipidemia, unspecified hyperlipidemia type - Plan: Lipid panel Given cholesterol info rec healthy diet and exercise   covid 19 +09/11/19 as of 10/16/19 still having intractable h/a and fatigue  On propranolol 10 mg bid w/o help  Referred urgently to neurology   Provider: Dr. Olivia Mackie McLean-Scocuzza-[facility]

## 2019-04-19 ENCOUNTER — Encounter: Payer: Self-pay | Admitting: Internal Medicine

## 2019-04-19 DIAGNOSIS — Z720 Tobacco use: Secondary | ICD-10-CM | POA: Insufficient documentation

## 2019-04-19 DIAGNOSIS — E785 Hyperlipidemia, unspecified: Secondary | ICD-10-CM | POA: Insufficient documentation

## 2019-05-18 ENCOUNTER — Telehealth: Payer: Self-pay | Admitting: Internal Medicine

## 2019-05-18 NOTE — Telephone Encounter (Signed)
I called pt and left vm to call [facility] at [phone] to sch.

## 2019-05-18 NOTE — Telephone Encounter (Signed)
[facility] stated numerous attempts were tried to contact pt none of the attempts were successful.  I called pt and left vm to call [facility] at [phone] to sch.

## 2019-06-07 ENCOUNTER — Encounter: Payer: Self-pay | Admitting: Internal Medicine

## 2019-06-07 ENCOUNTER — Other Ambulatory Visit: Payer: Self-pay | Admitting: Surgical

## 2019-06-07 ENCOUNTER — Other Ambulatory Visit: Payer: Self-pay | Admitting: Internal Medicine

## 2019-06-07 ENCOUNTER — Telehealth: Payer: Self-pay | Admitting: Plastic Surgery

## 2019-06-07 DIAGNOSIS — G4700 Insomnia, unspecified: Secondary | ICD-10-CM

## 2019-06-07 DIAGNOSIS — N62 Hypertrophy of breast: Secondary | ICD-10-CM

## 2019-06-07 DIAGNOSIS — F419 Anxiety disorder, unspecified: Secondary | ICD-10-CM

## 2019-06-07 MED ORDER — HYDROXYZINE HCL 25 MG PO TABS: 25.0000 mg | ORAL_TABLET | Freq: Every evening | ORAL | 2 refills | Status: DC | PRN

## 2019-06-07 NOTE — Progress Notes (Signed)
Order for MM 3D screening b/l placed

## 2019-06-07 NOTE — Telephone Encounter (Signed)
-----   Message from Hanahan B [place] sent at 06/07/2019 10:30 AM EDT ----- Regarding: MMG order Can one of you please put in a new MMG order for this patient. She will go to the [facility]. She missed her last appointment due to anxiety - as the appointment was on her late mother's birthday - who also passed away from cancer.   Thank you!

## 2019-06-12 ENCOUNTER — Ambulatory Visit
Admission: RE | Admit: 2019-06-12 | Discharge: 2019-06-12 | Disposition: A | Discharge status: Home / Self Care | Payer: 59 | Source: Ambulatory Visit | Attending: Surgical | Admitting: Surgical

## 2019-06-12 DIAGNOSIS — N62 Hypertrophy of breast: Secondary | ICD-10-CM | POA: Insufficient documentation

## 2019-06-12 DIAGNOSIS — Z1231 Encounter for screening mammogram for malignant neoplasm of breast: Secondary | ICD-10-CM | POA: Diagnosis not present

## 2019-06-12 IMAGING — MG DIGITAL SCREENING BILAT W/ TOMO W/ CAD
6 of 11 series · 6 of 31 positions shown · non-contrast
Comparison: None.

CLINICAL DATA: Screening. This is the patient's initial baseline
mammogram. Family history of breast cancer in maternal aunt at age
40.

EXAM:
DIGITAL SCREENING BILATERAL MAMMOGRAM WITH TOMO AND CAD

[R MLO synth-2D (1 of 2)]
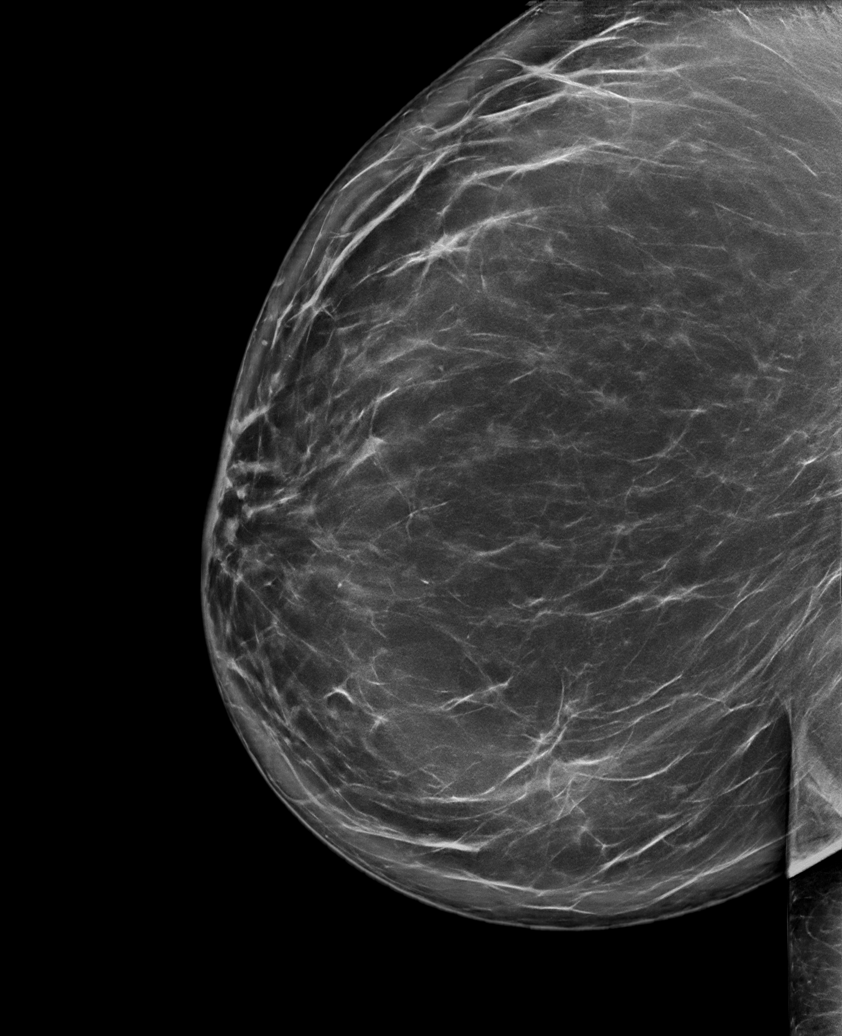

[R CV synth-2D]
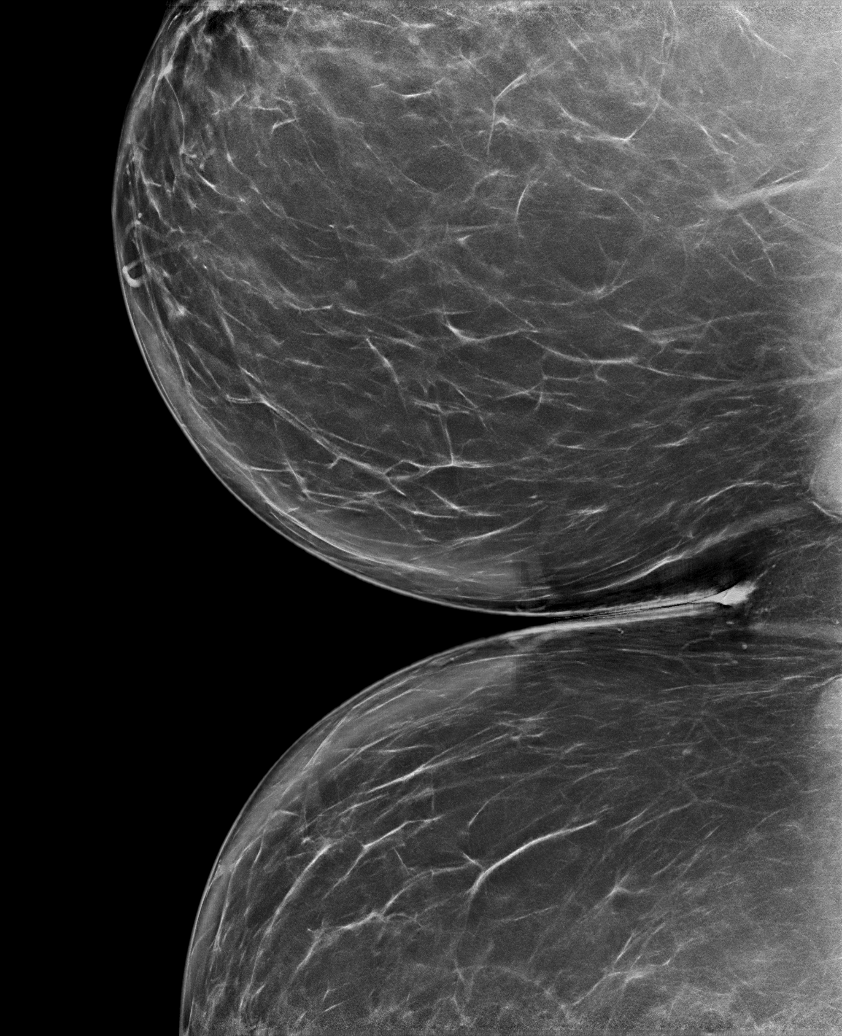

[L CC synth-2D]
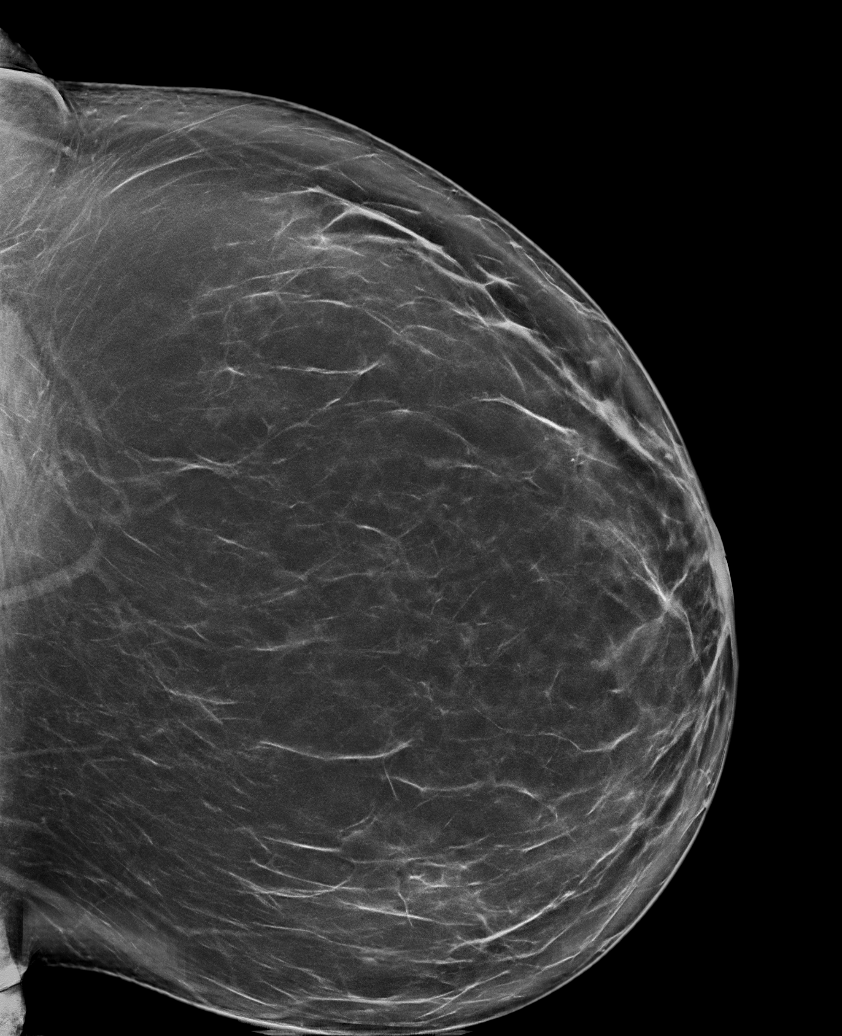

[R CC synth-2D]
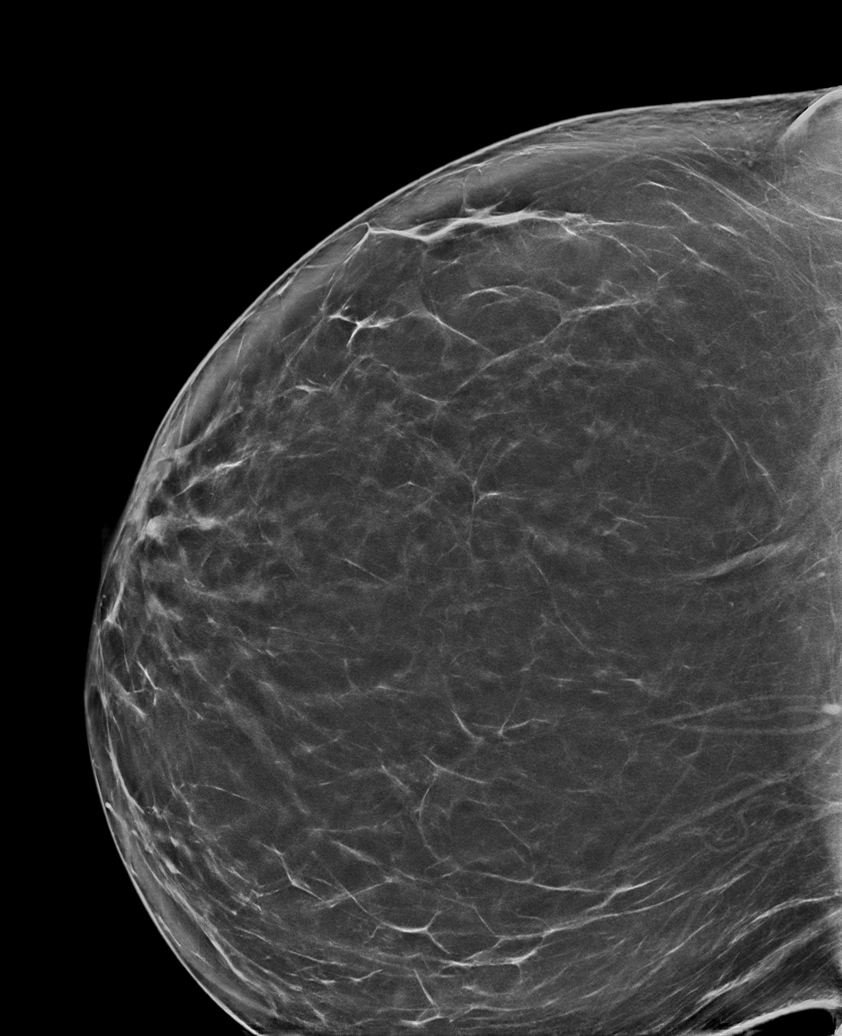

[R MLO synth-2D (2 of 2)]
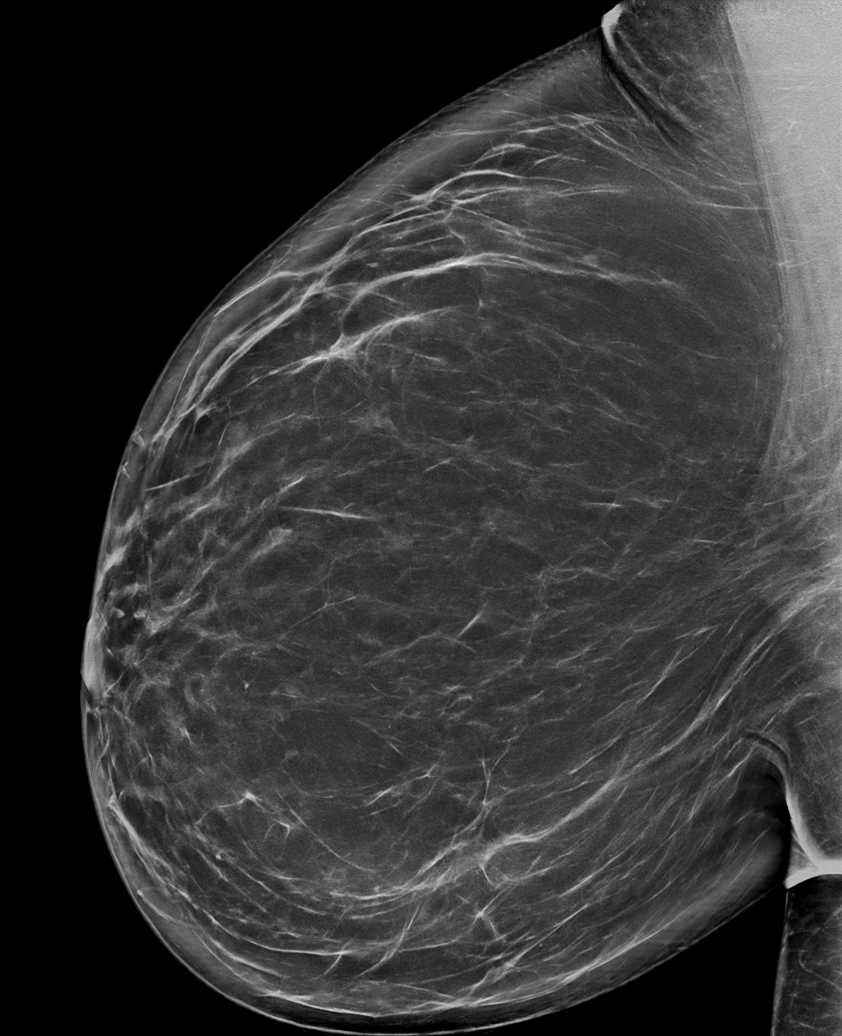

[L MLO synth-2D]
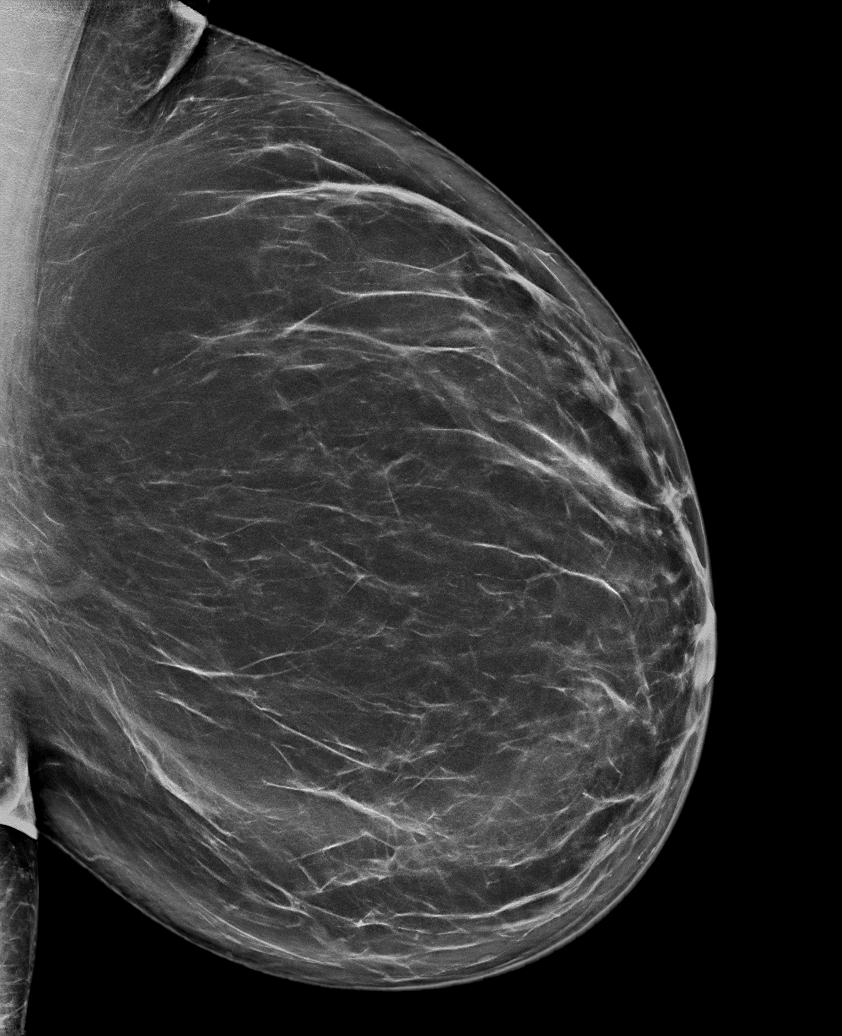

[6 of 31 positions shown; findings below may reference images not displayed]

ACR Breast Density Category b: There are scattered areas of
fibroglandular density.
FINDINGS: There are no findings suspicious for malignancy. Images were
processed with CAD.
IMPRESSION: No mammographic evidence of malignancy. A result letter of this
screening mammogram will be mailed directly to the patient.

RECOMMENDATION:
Screening mammogram at age 40. (Code:[7B])

BI-RADS CATEGORY  1: Negative.

## 2019-06-30 ENCOUNTER — Encounter: Payer: 59 | Admitting: Surgical

## 2019-07-15 ENCOUNTER — Other Ambulatory Visit (HOSPITAL_COMMUNITY): Payer: 59

## 2019-07-18 ENCOUNTER — Telehealth (INDEPENDENT_AMBULATORY_CARE_PROVIDER_SITE_OTHER): Payer: 59 | Admitting: Surgical

## 2019-07-18 DIAGNOSIS — M549 Dorsalgia, unspecified: Secondary | ICD-10-CM

## 2019-07-18 DIAGNOSIS — M545 Low back pain: Secondary | ICD-10-CM

## 2019-07-18 DIAGNOSIS — N62 Hypertrophy of breast: Secondary | ICD-10-CM

## 2019-07-18 DIAGNOSIS — G8929 Other chronic pain: Secondary | ICD-10-CM

## 2019-07-28 ENCOUNTER — Encounter: Payer: 59 | Admitting: Surgical

## 2019-08-02 ENCOUNTER — Other Ambulatory Visit: Payer: Self-pay

## 2019-08-02 NOTE — Telephone Encounter (Signed)
Spoke with the patient and she has been scheduled to come in and see Sandra Castaneda tomorrow at 4:30pm.   Patient screened and having no other symptoms than rash and pain.

## 2019-08-03 ENCOUNTER — Ambulatory Visit (INDEPENDENT_AMBULATORY_CARE_PROVIDER_SITE_OTHER): Payer: 59 | Admitting: Nurse Practitioner

## 2019-08-03 ENCOUNTER — Encounter: Payer: Self-pay | Admitting: Nurse Practitioner

## 2019-08-03 VITALS — BP 128/78 | HR 95 | Temp 97.9°F | Ht 70.0 in | Wt 256.0 lb

## 2019-08-03 DIAGNOSIS — T148XXA Other injury of unspecified body region, initial encounter: Secondary | ICD-10-CM | POA: Insufficient documentation

## 2019-08-03 DIAGNOSIS — T2101XA Burn of unspecified degree of chest wall, initial encounter: Secondary | ICD-10-CM | POA: Diagnosis not present
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
## 2019-08-03 NOTE — Progress Notes (Signed)
Established Patient Office Visit  Subjective:  Patient ID: Sandra Castaneda, female    DOB: 11-27-1985  Age: 34 y.o. MRN: [id number]  CC:  Chief Complaint  Patient presents with  . 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  Patient is doing repetitive arm motions and it has rubbed an area raw under her right breast and right chest where the breast has contact. The left breast is normal.  She initially treated this with cornstarch powder and that seemed to burn it so she stopped using it. More recently,  she has been applying triple antibiotic and covering it with a surgical dressing and tape. It is feeling some better.  Patient has surgery scheduled in August  to have breast reduction.  She is not diabetic.  She has not had yeast infection problems in her skin folds.  She feels well otherwise.  She has noted no fevers or chills.  Past Medical History:  Diagnosis Date  . Anemia   . Anxiety and depression    lexapro 10 mg no help, zoloft like zombie  . BRCA negative    per pt report, done through [facility]  . Chronic low back pain    11/05/2018 MRI L spine 3. 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 Heavy menses   . Increased risk of breast cancer    IBIS=25.3%  . Large breasts   . Ovarian cyst 2007    Past Surgical History:  Procedure Laterality Date  . CESAREAN SECTION  2008   FTP  . CESAREAN SECTION WITH BILATERAL TUBAL LIGATION N/A 02/04/2016   Procedure: CESAREAN SECTION WITH BILATERAL TUBAL LIGATION;  Surgeon: Will Bonnet, MD;  Location: [facility] ORS;  Service: Obstetrics;  Laterality: N/A;  . LAPAROTOMY  2007   ruptured ovarian cyst Dr. Ammie Dalton     Family History  Problem Relation Age of Onset  . Brain cancer Mother   . Hypertension Mother   . Ovarian cancer Mother 23  . Lung cancer Mother   . Cancer Mother         ovarian to lung to brain  . Diabetes Mellitus I Father   . Pancreatic cancer Father 44  . Cancer Father        stomach cancer  . Diabetes Father   . Hypertension Brother   . Breast cancer Maternal Aunt 25  . Diabetes Mellitus II Paternal Aunt   . Diabetes Mellitus II Paternal Grandmother   . Breast cancer Maternal Aunt 28  . Anxiety disorder Brother     Social History   Socioeconomic History  . Marital status: Single    Spouse name: Not on file  . Number of children: Not on file  . Years of education: Not on file  . Highest education level: Not on file  Occupational History  . Not on file  Tobacco Use  . Smoking status: Current Every Day Smoker    Packs/day: 0.25  . Smokeless tobacco: Never Used  Substance and Sexual Activity  . Alcohol use: No  . Drug use: No  . Sexual activity: Yes    Partners: Male    Birth control/protection: Surgical  Other Topics Concern  . Not on file  Social History Narrative   DPR brother Brennen Gardiner [phone]   64 y.o Vaugn as of  01/13/19   2 y.o daughter as of 01/13/2019    Single    4 brothers    Smoker as of 01/13/19    Works [employer] [place] [employer]    Social Determinants of Radio broadcast assistant Strain:   . Difficulty of Paying Living Expenses:   Food Insecurity:   . Worried About Charity fundraiser in the Last Year:   . Arboriculturist in the Last Year:   Transportation Needs:   . Film/video editor (Medical):   Marland Kitchen Lack of Transportation (Non-Medical):   Physical Activity:   . Days of Exercise per Week:   . Minutes of Exercise per Session:   Stress:   . Feeling of Stress :   Social Connections:   . Frequency of Communication with Friends and Family:   . Frequency of Social Gatherings with Friends and Family:   . Attends Religious Services:   . Active Member of Clubs or Organizations:   . Attends Archivist Meetings:   Marland Kitchen Marital Status:   Intimate Partner Violence:   . Fear of Current or  Ex-Partner:   . Emotionally Abused:   Marland Kitchen Physically Abused:   . Sexually Abused:     Outpatient Medications Prior to Visit  Medication Sig Dispense Refill  . Cholecalciferol 1.25 MG (50000 UT) capsule Take 1 capsule (50,000 Units total) by mouth once a week. 13 capsule 2  . DULoxetine (CYMBALTA) 20 MG capsule Take 1 capsule (20 mg total) by mouth daily. 90 capsule 1  . hydrOXYzine (ATARAX/VISTARIL) 25 MG tablet Take 1-2 tablets (25-50 mg total) by mouth at bedtime as needed. 60 tablet 2   No facility-administered medications prior to visit.    Allergies  Allergen Reactions  . Chantix [Varenicline]     Crying   . Lexapro [Escitalopram]     No help   . Zoloft [Sertraline]     Zombie    Review of Systems Pertinent positives noted in  history of present illness otherwise review of systems negative.   Objective:    Physical Exam Vitals reviewed.  Constitutional:      Appearance: Normal appearance. She is obese.  HENT:     Head: Normocephalic and atraumatic.  Musculoskeletal:     Cervical back: Normal range of motion and neck supple.  Skin:    General: Skin is warm and dry.     Comments: Under right breast is abraded skin underside of breast and on chest wall where the breast touches. This is a superficial skin excoriation /like a burn. No drainage. Superficial. No sign of infection or candidiasis. A square shape is where the patient placed a dressing and it made the mark.  Large breast size. Left breast is normal.   Neurological:     General: No focal deficit present.     Mental Status: She is alert and oriented to person, place, and time.  Psychiatric:        Behavior: Behavior normal.     BP 128/78 (BP Location: Left Arm, Patient Position: Sitting, Cuff Size: Normal)   Pulse 95   Temp 97.9 F (36.6 C) (Oral)   Ht _0  (1.778 m)   Wt 256 lb (116.1 kg)   SpO2 98%   BMI 36.73 kg/m  Wt Readings from Last 3 Encounters:  08/03/19 256 lb (116.1 kg)  04/18/19 255  lb 9.6 oz (115.9 kg)  03/28/19 252 lb (114.3 kg)     Health Maintenance Due  Topic Date Due  . Hepatitis C Screening  Never done  . COVID-19 Vaccine (1) Never done    There are no preventive care reminders to display for this patient.  Lab Results  Component Value Date   TSH 1.49 02/06/2019   Lab Results  Component Value Date   WBC 10.8 (H) 02/06/2019   HGB 13.1 02/06/2019   HCT 38.8 02/06/2019   MCV 90.4 02/06/2019   PLT 319.0 02/06/2019   Lab Results  Component Value Date   NA 140 02/06/2019   K 3.7 02/06/2019   CO2 25 02/06/2019   GLUCOSE 95 02/06/2019   BUN 9 02/06/2019   CREATININE 0.69 02/06/2019   BILITOT 0.3 02/06/2019   ALKPHOS 71 02/06/2019   AST 14 02/06/2019   ALT 15 02/06/2019   PROT 6.7 02/06/2019   ALBUMIN 4.1 02/06/2019   CALCIUM 8.8 02/06/2019   ANIONGAP 9 11/04/2018   GFR 118.15 02/06/2019   Lab Results  Component Value Date   CHOL 176 02/06/2019   Lab Results  Component Value Date   HDL 33.00 (L) 02/06/2019   Lab Results  Component Value Date   LDLCALC 113 (H) 02/06/2019   Lab Results  Component Value Date   TRIG 151.0 (H) 02/06/2019   Lab Results  Component Value Date   CHOLHDL 5 02/06/2019   Lab Results  Component Value Date   HGBA1C 5.6 02/06/2019      Assessment & Plan:   Problem List Items Addressed This Visit      Musculoskeletal and Integument   Skin excoriation - Primary      Meds ordered this encounter  Medications  . silver sulfADIAZINE (SILVADENE) 1 % cream    Sig: Apply 1 application topically daily for 14 days.    Dispense:  14 g    Refill:  0    Order Specific Question:   Supervising Provider    Answer:   Einar Pheasant [[id number]]  Your breast has rubbed the skin raw and you have a superficial burn area. It must breast kept moist and covered- not to dry out. You have been applying neosporin and a Telfa dressing on both sites.   Please call patient and advise her regarding the directions for her  breast skin excoriation.   1. Please stop the Neosporin ointment.   2. I ordered Silvadene cream to apply once daily.    3. Cover with Telfa pads (non-stick)  on both of those abraded areas to protect agst further chafing. Use paper tape only to secure so you do not peel off any more skin.   4. This is a sulfa-based antibiotic cream.  It is possible to develop a sulfa allergy so she needs to monitor the site to make sure that it does not get a rash.  She can use the cream as long as it remains open and irritated and may stop the cream when it is no longer needed.  5. Let her breast surgeon know this is going.He can see the pictures in Epic imbedded in my office note.    Follow-up: Return in about 2 weeks (around 08/17/2019).   This visit occurred during the SARS-CoV-2 public health emergency.  Safety protocols were in place, including screening questions prior to the visit, additional usage of staff PPE, and extensive cleaning of exam room while observing appropriate contact time as indicated for disinfecting solutions.   Denice Paradise, NP

## 2019-08-04 ENCOUNTER — Telehealth: Payer: Self-pay | Admitting: Nurse Practitioner

## 2019-08-04 MED ORDER — SILVER SULFADIAZINE 1 % EX CREA
1.0000 | TOPICAL_CREAM | Freq: Every day | CUTANEOUS | 0 refills | Status: AC
Start: 2019-08-04 — End: 2019-08-18

## 2019-08-04 NOTE — Telephone Encounter (Signed)
Please call patient and advise her regarding the directions for her breast skin excoriation.   1. Please stop the Neosporin ointment.   2. I ordered Silvadene cream to apply once daily.    3. Cover with Telfa pads (non-stick)  on both of those abraded areas to protect agst further chafing. Use paper tape only to secure so you do not peel off any more skin.   4. This is a sulfa-based antibiotic cream.  It is possible to develop a sulfa allergy so she needs to monitor the site to make sure that it does not get a rash.  She can use the cream as long as it remains open and irritated and may stop the cream when it is no longer needed.  5. Let her breast surgeon know this is going.He can see the pictures in Epic imbedded in my office note.

## 2019-08-04 NOTE — Telephone Encounter (Signed)
Patient aware of below message. Patient is requesting a work note; she forgot to ask for one yesterday when she was here. She states she had to leave work Tuesday night and wont be able to return until Monday night while wounds are trying to heal. She would like note uploaded to her mychart if possible.

## 2019-08-04 NOTE — Telephone Encounter (Signed)
See patient message encounter

## 2019-08-04 NOTE — Telephone Encounter (Signed)
LMTCB

## 2019-08-04 NOTE — Telephone Encounter (Signed)
Deleted

## 2019-08-08 ENCOUNTER — Encounter: Payer: 59 | Admitting: Plastic Surgery

## 2019-08-18 ENCOUNTER — Other Ambulatory Visit (HOSPITAL_COMMUNITY)
Admission: RE | Admit: 2019-08-18 | Discharge: 2019-08-18 | Disposition: A | Discharge status: Home / Self Care | Payer: 59 | Source: Ambulatory Visit | Attending: Internal Medicine | Admitting: Internal Medicine

## 2019-08-18 ENCOUNTER — Other Ambulatory Visit (INDEPENDENT_AMBULATORY_CARE_PROVIDER_SITE_OTHER): Payer: 59

## 2019-08-18 ENCOUNTER — Other Ambulatory Visit: Payer: Self-pay

## 2019-08-18 DIAGNOSIS — A599 Trichomoniasis, unspecified: Secondary | ICD-10-CM | POA: Diagnosis not present

## 2019-08-18 DIAGNOSIS — D72829 Elevated white blood cell count, unspecified: Secondary | ICD-10-CM

## 2019-08-18 DIAGNOSIS — E785 Hyperlipidemia, unspecified: Secondary | ICD-10-CM | POA: Diagnosis not present

## 2019-08-18 DIAGNOSIS — E559 Vitamin D deficiency, unspecified: Secondary | ICD-10-CM

## 2019-08-18 DIAGNOSIS — Z599 Problem related to housing and economic circumstances, unspecified: Secondary | ICD-10-CM | POA: Diagnosis not present

## 2019-08-18 LAB — LIPID PANEL
Cholesterol: 169 mg/dL (ref 0–200)
HDL: 37.3 mg/dL — ABNORMAL LOW (ref >39.00)
LDL Cholesterol: 113 mg/dL — ABNORMAL HIGH (ref 0–99)
NonHDL: 132.13
Total CHOL/HDL Ratio: 5
Triglycerides: 96 mg/dL (ref 0.0–149.0)
VLDL: 19.2 mg/dL (ref 0.0–40.0)

## 2019-08-18 LAB — CBC WITH DIFFERENTIAL/PLATELET
Basophils Absolute: 0.1 10*3/uL (ref 0.0–0.1)
Basophils Relative: 0.4 % (ref 0.0–3.0)
Eosinophils Absolute: 0.2 10*3/uL (ref 0.0–0.7)
Eosinophils Relative: 1.5 % (ref 0.0–5.0)
HCT: 36.9 % (ref 36.0–46.0)
Hemoglobin: 12.6 g/dL (ref 12.0–15.0)
Lymphocytes Relative: 20.8 % (ref 12.0–46.0)
Lymphs Abs: 2.7 10*3/uL (ref 0.7–4.0)
MCHC: 34.1 g/dL (ref 30.0–36.0)
MCV: 88.3 fl (ref 78.0–100.0)
Monocytes Absolute: 0.9 10*3/uL (ref 0.1–1.0)
Monocytes Relative: 7 % (ref 3.0–12.0)
Neutro Abs: 9.1 10*3/uL — ABNORMAL HIGH (ref 1.4–7.7)
Neutrophils Relative %: 70.3 % (ref 43.0–77.0)
Platelets: 323 10*3/uL (ref 150.0–400.0)
RBC: 4.18 Mil/uL (ref 3.87–5.11)
RDW: 13.9 % (ref 11.5–15.5)
WBC: 13 10*3/uL — ABNORMAL HIGH (ref 4.0–10.5)

## 2019-08-18 LAB — VITAMIN D 25 HYDROXY (VIT D DEFICIENCY, FRACTURES): VITD: 33.42 ng/mL (ref 30.00–100.00)

## 2019-08-24 LAB — URINE CYTOLOGY ANCILLARY ONLY
Comment: NEGATIVE
Trichomonas: NEGATIVE

## 2019-08-30 ENCOUNTER — Encounter (HOSPITAL_COMMUNITY): Payer: Self-pay

## 2019-08-30 ENCOUNTER — Other Ambulatory Visit (HOSPITAL_COMMUNITY): Payer: Self-pay

## 2019-08-30 NOTE — Progress Notes (Signed)
Sent a message in mychart regarding the covid drive- thru location change. 

## 2019-08-31 NOTE — Progress Notes (Signed)
ICD-10-CM   1. Symptomatic mammary hypertrophy  N62   2. Chronic midline low back pain without sciatica  M54.5    G89.29   3. Chronic mid back pain  M54.9    G89.29   4. Tobacco abuse  Z72.0       Patient ID: Sandra Castaneda, female    DOB: 05-May-1985, 34 y.o.   MRN: [id number]   History of Present Illness: Sandra Castaneda is a 34 y.o.  female  with a history of symptomatic mammary hypertrophy.  She presents for preoperative evaluation for upcoming procedure, bilateral breast reduction with liposuction, scheduled for 09/14/2019 with Dr. Marla Roe.  Summary from previous visit: Patient's breasts are extremely large and fairly symmetric.  Sternal nipple distance on the right is 39 cm and the left is 39 cm.  The IMF distance is 21 cm.  She is 5 feet 10 inches tall and weighs 252 pounds.  Preoperative bra size is 42 FF.  The estimated excess breast tissue to be removed at time of surgery is 1000 g on each side.  She had physical therapy at [facility] but it did not help her neck and back pain.  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 BRCA negative  The patient has not had problems with anesthesia.   Past Medical History: Allergies: Allergies  Allergen Reactions  . Chantix [Varenicline]     Crying   . Lexapro [Escitalopram]     No help   . Zoloft [Sertraline]     Zombie     Current Medications:  Current Outpatient Medications:  .  Cholecalciferol 1.25 MG (50000 UT) capsule, Take 1 capsule (50,000 Units total) by mouth once a week., Disp: 13 capsule, Rfl: 2 .  acetaminophen (TYLENOL) 500 MG tablet, Take 1 tablet (500 mg total) by mouth every 6 (six) hours as needed. For use AFTER surgery, Disp: 30 tablet, Rfl: 0 .  cephALEXin (KEFLEX)  500 MG capsule, Take 1 capsule (500 mg total) by mouth 4 (four) times daily for 3 days. For use AFTER surgery, Disp: 12 capsule, Rfl: 0 .  HYDROcodone-acetaminophen (NORCO) 5-325 MG tablet, Take 1 tablet by mouth every 8 (eight) hours as needed for up to 7 days for severe pain. For use AFTER Surgery, Disp: 21 tablet, Rfl: 0 .  ibuprofen (ADVIL) 600 MG tablet, Take 1 tablet (600 mg total) by mouth every 6 (six) hours as needed for mild pain or moderate pain. For use AFTER surgery, Disp: 30 tablet, Rfl: 0 .  ondansetron (ZOFRAN) 4 MG tablet, Take 1 tablet (4 mg total) by mouth every 8 (eight) hours as needed for nausea or vomiting., Disp: 20 tablet, Rfl: 0  Past Medical Problems: Past Medical History:  Diagnosis Date  . Anemia   . Anxiety and depression    lexapro 10 mg no help, zoloft like zombie  . BRCA negative    per pt report, done through [facility]  . Chronic low back pain    11/05/2018 MRI L spine 3. 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 Heavy menses   . Increased risk of breast cancer    IBIS=25.3%  . Large breasts   . Ovarian cyst 2007    Past Surgical History: Past Surgical History:  Procedure  Laterality Date  . CESAREAN SECTION  2008   FTP  . CESAREAN SECTION WITH BILATERAL TUBAL LIGATION N/A 02/04/2016   Procedure: CESAREAN SECTION WITH BILATERAL TUBAL LIGATION;  Surgeon: Will Bonnet, MD;  Location: [facility] ORS;  Service: Obstetrics;  Laterality: N/A;  . LAPAROTOMY  2007   ruptured ovarian cyst Dr. Ammie Dalton     Social History: Social History   Socioeconomic History  . Marital status: Single    Spouse name: Not on file  . Number of children: Not on file  . Years of education: Not on file  . Highest education level: Not on file  Occupational History  . Not on file  Tobacco Use  . Smoking status: Current Every Day Smoker    Packs/day: 0.25  . Smokeless tobacco: Never Used  Substance and Sexual Activity  . Alcohol use: No  . Drug use:  No  . Sexual activity: Yes    Partners: Male    Birth control/protection: Surgical  Other Topics Concern  . Not on file  Social History Narrative   DPR brother Chanda Laperle [phone]   13 y.o [place]n as of 01/13/19   2 y.o daughter as of 01/13/2019    Single    4 brothers    Smoker as of 01/13/19    Works [place] [employer]    Social Determinants of Health   Financial Resource Strain:   . Difficulty of Paying Living Expenses:   Food Insecurity:   . Worried About Charity fundraiser in the Last Year:   . Arboriculturist in the Last Year:   Transportation Needs:   . Film/video editor (Medical):   Marland Kitchen Lack of Transportation (Non-Medical):   Physical Activity:   . Days of Exercise per Week:   . Minutes of Exercise per Session:   Stress:   . Feeling of Stress :   Social Connections:   . Frequency of Communication with Friends and Family:   . Frequency of Social Gatherings with Friends and Family:   . Attends Religious Services:   . Active Member of Clubs or Organizations:   . Attends Archivist Meetings:   Marland Kitchen Marital Status:   Intimate Partner Violence:   . Fear of Current or Ex-Partner:   . Emotionally Abused:   Marland Kitchen Physically Abused:   . Sexually Abused:     Family History: Family History  Problem Relation Age of Onset  . Brain cancer Mother   . Hypertension Mother   . Ovarian cancer Mother 52  . Lung cancer Mother   . Cancer Mother        ovarian to lung to brain  . Diabetes Mellitus I Father   . Pancreatic cancer Father 56  . Cancer Father        stomach cancer  . Diabetes Father   . Hypertension Brother   . Breast cancer Maternal Aunt 10  . Diabetes Mellitus II Paternal Aunt   . Diabetes Mellitus II Paternal Grandmother   . Breast cancer Maternal Aunt 30  . Anxiety disorder Brother     Review of Systems: Review of Systems  Constitutional: Negative for chills and fever.  HENT: Negative for congestion and sore throat.     Respiratory: Negative for cough and shortness of breath.   Cardiovascular: Negative for chest pain and palpitations.  Gastrointestinal: Negative for abdominal pain, nausea and vomiting.  Musculoskeletal: Positive for back pain and neck pain. Negative for joint pain and  myalgias.  Skin: Negative for itching and rash.    Physical Exam: Vital Signs BP 116/78 (BP Location: Left Arm, Patient Position: Sitting, Cuff Size: Large)   Pulse 80   Temp 98.8 F (37.1 C) (Oral)   Ht '5\' 11"'$  (1.803 m)   Wt (!) 252 lb 9.6 oz (114.6 kg)   SpO2 100%   BMI 35.23 kg/m  Physical Exam Constitutional:      General: She is not in acute distress.    Appearance: Normal appearance. She is obese.  HENT:     Head: Normocephalic and atraumatic.  Eyes:     Extraocular Movements: Extraocular movements intact.  Cardiovascular:     Rate and Rhythm: Normal rate and regular rhythm.     Pulses: Normal pulses.     Heart sounds: Normal heart sounds.  Pulmonary:     Effort: Pulmonary effort is normal.     Breath sounds: Normal breath sounds. No wheezing, rhonchi or rales.  Abdominal:     General: Bowel sounds are normal.     Palpations: Abdomen is soft.  Musculoskeletal:        General: No swelling. Normal range of motion.     Cervical back: Normal range of motion.  Skin:    General: Skin is warm and dry.     Coloration: Skin is not pale.     Findings: No erythema or rash.  Neurological:     General: No focal deficit present.     Mental Status: She is alert and oriented to person, place, and time.  Psychiatric:        Mood and Affect: Mood normal.        Behavior: Behavior normal.        Thought Content: Thought content normal.        Judgment: Judgment normal.     Assessment/Plan:  Ms. Schnitzer scheduled for bilateral breast reduction with liposuction with Dr. Marla Roe.  Risks, benefits, and alternatives of procedure discussed, questions answered and consent obtained.    Smoking Status: Current  smoker - She has cut back to 2-3 cigarettes every other day and using nicotine patch; Counseling Given? Yes - extensive discussion about the increased risks for surgery associated with nicotine use. Nicotine test order provided.  Nicotine test ordered for patient to be completed by 8/2. Last Mammogram: 06/12/2019; Results: No findings suspicious for malignancy  Caprini Score: 3 Moderate; Risk Factors include: 34 year old female, BMI > 25, and length of planned surgery. Recommendation for mechanical or pharmacological prophylaxis during surgery. Encourage early ambulation.   Pictures obtained: 03/28/2019  Post-op Rx sent to pharmacy: Norco, Keflex, Zofran, ibuprofen, Tylenol  Patient was provided with the breast reduction risks and General Surgical Risk consent document and Pain Medication Agreement prior to their appointment.  They had adequate time to read through the risk consent documents and Pain Medication Agreement. We also discussed them in person together during this preop appointment. All of their questions were answered to their satisfaction.  Recommended calling if they have any further questions.  Risk consent form and Pain Medication Agreement to be scanned into patient's chart.  The risk that can be encountered with breast reduction were discussed and include the following but not limited to these:  Breast asymmetry, fluid accumulation, firmness of the breast, inability to breast feed, loss of nipple or areola, skin loss, decrease or no nipple sensation, fat necrosis of the breast tissue, bleeding, infection, healing delay.  There are risks of anesthesia, changes to skin sensation  and injury to nerves or blood vessels.  The muscle can be temporarily or permanently injured.  You may have an allergic reaction to tape, suture, glue, blood products which can result in skin discoloration, swelling, pain, skin lesions, poor healing.  Any of these can lead to the need for revisonal surgery or stage  procedures.  A reduction has potential to interfere with diagnostic procedures.  Nipple or breast piercing can increase risks of infection.  This procedure is best done when the breast is fully developed.  Changes in the breast will continue to occur over time.  Pregnancy can alter the outcomes of previous breast reduction surgery, weight gain and weigh loss can also effect the long term appearance.   The risks that can be encountered with and after liposuction were discussed and include the following but no limited to these:  Asymmetry, fluid accumulation, firmness of the area, fat necrosis with death of fat tissue, bleeding, infection, delayed healing, anesthesia risks, skin sensation changes, injury to structures including nerves, blood vessels, and muscles which may be temporary or permanent, allergies to tape, suture materials and glues, blood products, topical preparations or injected agents, skin and contour irregularities, skin discoloration and swelling, deep vein thrombosis, cardiac and pulmonary complications, pain, which may persist, persistent pain, recurrence of the lesion, poor healing of the incision, possible need for revisional surgery or staged procedures. Thiere can also be persistent swelling, poor wound healing, rippling or loose skin, worsening of cellulite, swelling, and thermal burn or heat injury from ultrasound with the ultrasound-assisted lipoplasty technique. Any change in weight fluctuations can alter the outcome.  The [place] was signed into law in 2016 which includes the topic of electronic health records.  This provides immediate access to information in MyChart.  This includes consultation notes, operative notes, office notes, lab results and pathology reports.  If you have any questions about what you read please let us know at your next visit or call us at the office.  We are right here with you.   Electronically signed by: Threasa Heads, PA-C 09/01/2019  12:26 PM

## 2019-09-01 ENCOUNTER — Encounter: Payer: Self-pay | Admitting: Plastic Surgery

## 2019-09-01 ENCOUNTER — Ambulatory Visit (INDEPENDENT_AMBULATORY_CARE_PROVIDER_SITE_OTHER): Payer: 59 | Admitting: Plastic Surgery

## 2019-09-01 ENCOUNTER — Other Ambulatory Visit: Payer: Self-pay

## 2019-09-01 VITALS — BP 116/78 | HR 80 | Temp 98.8°F | Ht 71.0 in | Wt 252.6 lb

## 2019-09-01 DIAGNOSIS — G8929 Other chronic pain: Secondary | ICD-10-CM

## 2019-09-01 DIAGNOSIS — Z720 Tobacco use: Secondary | ICD-10-CM

## 2019-09-01 DIAGNOSIS — M549 Dorsalgia, unspecified: Secondary | ICD-10-CM

## 2019-09-01 DIAGNOSIS — M545 Low back pain, unspecified: Secondary | ICD-10-CM

## 2019-09-01 DIAGNOSIS — N62 Hypertrophy of breast: Secondary | ICD-10-CM

## 2019-09-01 MED ORDER — CEPHALEXIN 500 MG PO CAPS
500.0000 mg | ORAL_CAPSULE | Freq: Four times a day (QID) | ORAL | 0 refills | Status: AC
Start: 2019-09-01 — End: 2019-09-04

## 2019-09-01 MED ORDER — ONDANSETRON HCL 4 MG PO TABS: 4.0000 mg | ORAL_TABLET | Freq: Three times a day (TID) | ORAL | 0 refills | Status: DC | PRN

## 2019-09-01 MED ORDER — IBUPROFEN 600 MG PO TABS
600.0000 mg | ORAL_TABLET | Freq: Four times a day (QID) | ORAL | 0 refills | Status: DC | PRN
Start: 2019-09-01 — End: 2019-10-19

## 2019-09-01 MED ORDER — ACETAMINOPHEN 500 MG PO TABS
500.0000 mg | ORAL_TABLET | Freq: Four times a day (QID) | ORAL | 0 refills | Status: DC | PRN
Start: 2019-09-01 — End: 2019-11-14

## 2019-09-01 MED ORDER — HYDROCODONE-ACETAMINOPHEN 5-325 MG PO TABS: 1.0000 | ORAL_TABLET | Freq: Three times a day (TID) | ORAL | 0 refills | Status: AC | PRN

## 2019-09-06 ENCOUNTER — Other Ambulatory Visit: Payer: Self-pay

## 2019-09-06 ENCOUNTER — Encounter (HOSPITAL_BASED_OUTPATIENT_CLINIC_OR_DEPARTMENT_OTHER): Payer: Self-pay | Admitting: Plastic Surgery

## 2019-09-07 ENCOUNTER — Telehealth: Payer: Self-pay | Admitting: Plastic Surgery

## 2019-09-07 NOTE — Telephone Encounter (Signed)
Called patient, per Dr. Ulice Bold and Johanna's request, to make sure the patient was aware of the increased risk - especially to the [facility] - that is associated with smoking. The patient expressed understanding and stated that her "goal is to have no cigarettes starting today." I encouraged her efforts and advised her to let me know if she needs anything between now and her surgery. She expressed her gratitude for the call and is looking forward to surgery next week.

## 2019-09-11 ENCOUNTER — Telehealth: Payer: Self-pay | Admitting: Plastic Surgery

## 2019-09-11 ENCOUNTER — Other Ambulatory Visit (HOSPITAL_COMMUNITY)
Admission: RE | Admit: 2019-09-11 | Discharge: 2019-09-11 | Disposition: A | Discharge status: Home / Self Care | Payer: 59 | Source: Ambulatory Visit | Attending: Plastic Surgery | Admitting: Plastic Surgery

## 2019-09-11 DIAGNOSIS — Z01812 Encounter for preprocedural laboratory examination: Secondary | ICD-10-CM | POA: Insufficient documentation

## 2019-09-11 DIAGNOSIS — U071 COVID-19: Secondary | ICD-10-CM | POA: Insufficient documentation

## 2019-09-11 LAB — SARS CORONAVIRUS 2 (TAT 6-24 HRS): SARS Coronavirus 2: POSITIVE — AB

## 2019-09-11 NOTE — Telephone Encounter (Signed)
Patient called to advise that [facility] is waiting to get information regarding whether sx is elective or not. Also, patient was told to quarantine after covid test and wants to know if the leave can be started on 09/11/2019 instead of 09/12/2019.

## 2019-09-12 NOTE — Telephone Encounter (Signed)
Spoke with patient.  All information was sent to [facility] on 8/4. Her surgery has been rescheduled due to testing positive for covid, so new FMLA forms will be filled out.

## 2019-09-12 NOTE — Progress Notes (Signed)
Contacted Dr. Gus Height office about patients positive COVID test patient will need to be postponed for 10 days unless her surgery is urgent/emergent. Spoke with Sandra Castaneda

## 2019-09-13 ENCOUNTER — Emergency Department
Admission: EM | Admit: 2019-09-13 | Discharge: 2019-09-13 | Disposition: A | Discharge status: Home / Self Care | Payer: 59 | Attending: Emergency Medicine | Admitting: Emergency Medicine

## 2019-09-13 ENCOUNTER — Emergency Department: Payer: 59

## 2019-09-13 ENCOUNTER — Encounter (INDEPENDENT_AMBULATORY_CARE_PROVIDER_SITE_OTHER): Payer: 59 | Admitting: Internal Medicine

## 2019-09-13 ENCOUNTER — Encounter: Payer: Self-pay | Admitting: Internal Medicine

## 2019-09-13 ENCOUNTER — Other Ambulatory Visit: Payer: Self-pay

## 2019-09-13 DIAGNOSIS — R0600 Dyspnea, unspecified: Secondary | ICD-10-CM | POA: Diagnosis not present

## 2019-09-13 DIAGNOSIS — F17210 Nicotine dependence, cigarettes, uncomplicated: Secondary | ICD-10-CM | POA: Diagnosis not present

## 2019-09-13 DIAGNOSIS — J9811 Atelectasis: Secondary | ICD-10-CM | POA: Diagnosis not present

## 2019-09-13 DIAGNOSIS — U071 COVID-19: Secondary | ICD-10-CM

## 2019-09-13 DIAGNOSIS — Z20822 Contact with and (suspected) exposure to covid-19: Secondary | ICD-10-CM | POA: Diagnosis not present

## 2019-09-13 DIAGNOSIS — Z853 Personal history of malignant neoplasm of breast: Secondary | ICD-10-CM | POA: Insufficient documentation

## 2019-09-13 DIAGNOSIS — R0602 Shortness of breath: Secondary | ICD-10-CM | POA: Diagnosis not present

## 2019-09-13 DIAGNOSIS — R2243 Localized swelling, mass and lump, lower limb, bilateral: Secondary | ICD-10-CM | POA: Insufficient documentation

## 2019-09-13 HISTORY — DX: COVID-19: U07.1

## 2019-09-13 LAB — BASIC METABOLIC PANEL
Anion gap: 10 (ref 5–15)
BUN: 7 mg/dL (ref 6–20)
CO2: 24 mmol/L (ref 22–32)
Calcium: 8.9 mg/dL (ref 8.9–10.3)
Chloride: 104 mmol/L (ref 98–111)
Creatinine, Ser: 0.72 mg/dL (ref 0.44–1.00)
GFR calc Af Amer: >60 mL/min (ref >60)
GFR calc non Af Amer: >60 mL/min (ref >60)
Glucose, Bld: 92 mg/dL (ref 70–99)
Potassium: 3.8 mmol/L (ref 3.5–5.1)
Sodium: 138 mmol/L (ref 135–145)

## 2019-09-13 LAB — TROPONIN I (HIGH SENSITIVITY): Troponin I (High Sensitivity): <2 ng/L (ref <18)

## 2019-09-13 LAB — CBC
HCT: 36.9 % (ref 36.0–46.0)
Hemoglobin: 13 g/dL (ref 12.0–15.0)
MCH: 30.2 pg (ref 26.0–34.0)
MCHC: 35.2 g/dL (ref 30.0–36.0)
MCV: 85.8 fL (ref 80.0–100.0)
Platelets: 284 10*3/uL (ref 150–400)
RBC: 4.3 MIL/uL (ref 3.87–5.11)
RDW: 13.4 % (ref 11.5–15.5)
WBC: 9.8 10*3/uL (ref 4.0–10.5)
nRBC: 0 % (ref 0.0–0.2)

## 2019-09-13 LAB — BRAIN NATRIURETIC PEPTIDE: B Natriuretic Peptide: 43.9 pg/mL (ref 0.0–100.0)

## 2019-09-13 IMAGING — CR DG CHEST 2V
2 series · 2 of 2 positions shown · non-contrast
Comparison: No pertinent prior exams are available for comparison.

CLINICAL DATA: Shortness of breath. Positive test [REDACTED] and
shortness of breath.

EXAM:
CHEST - 2 VIEW

[chest pa]
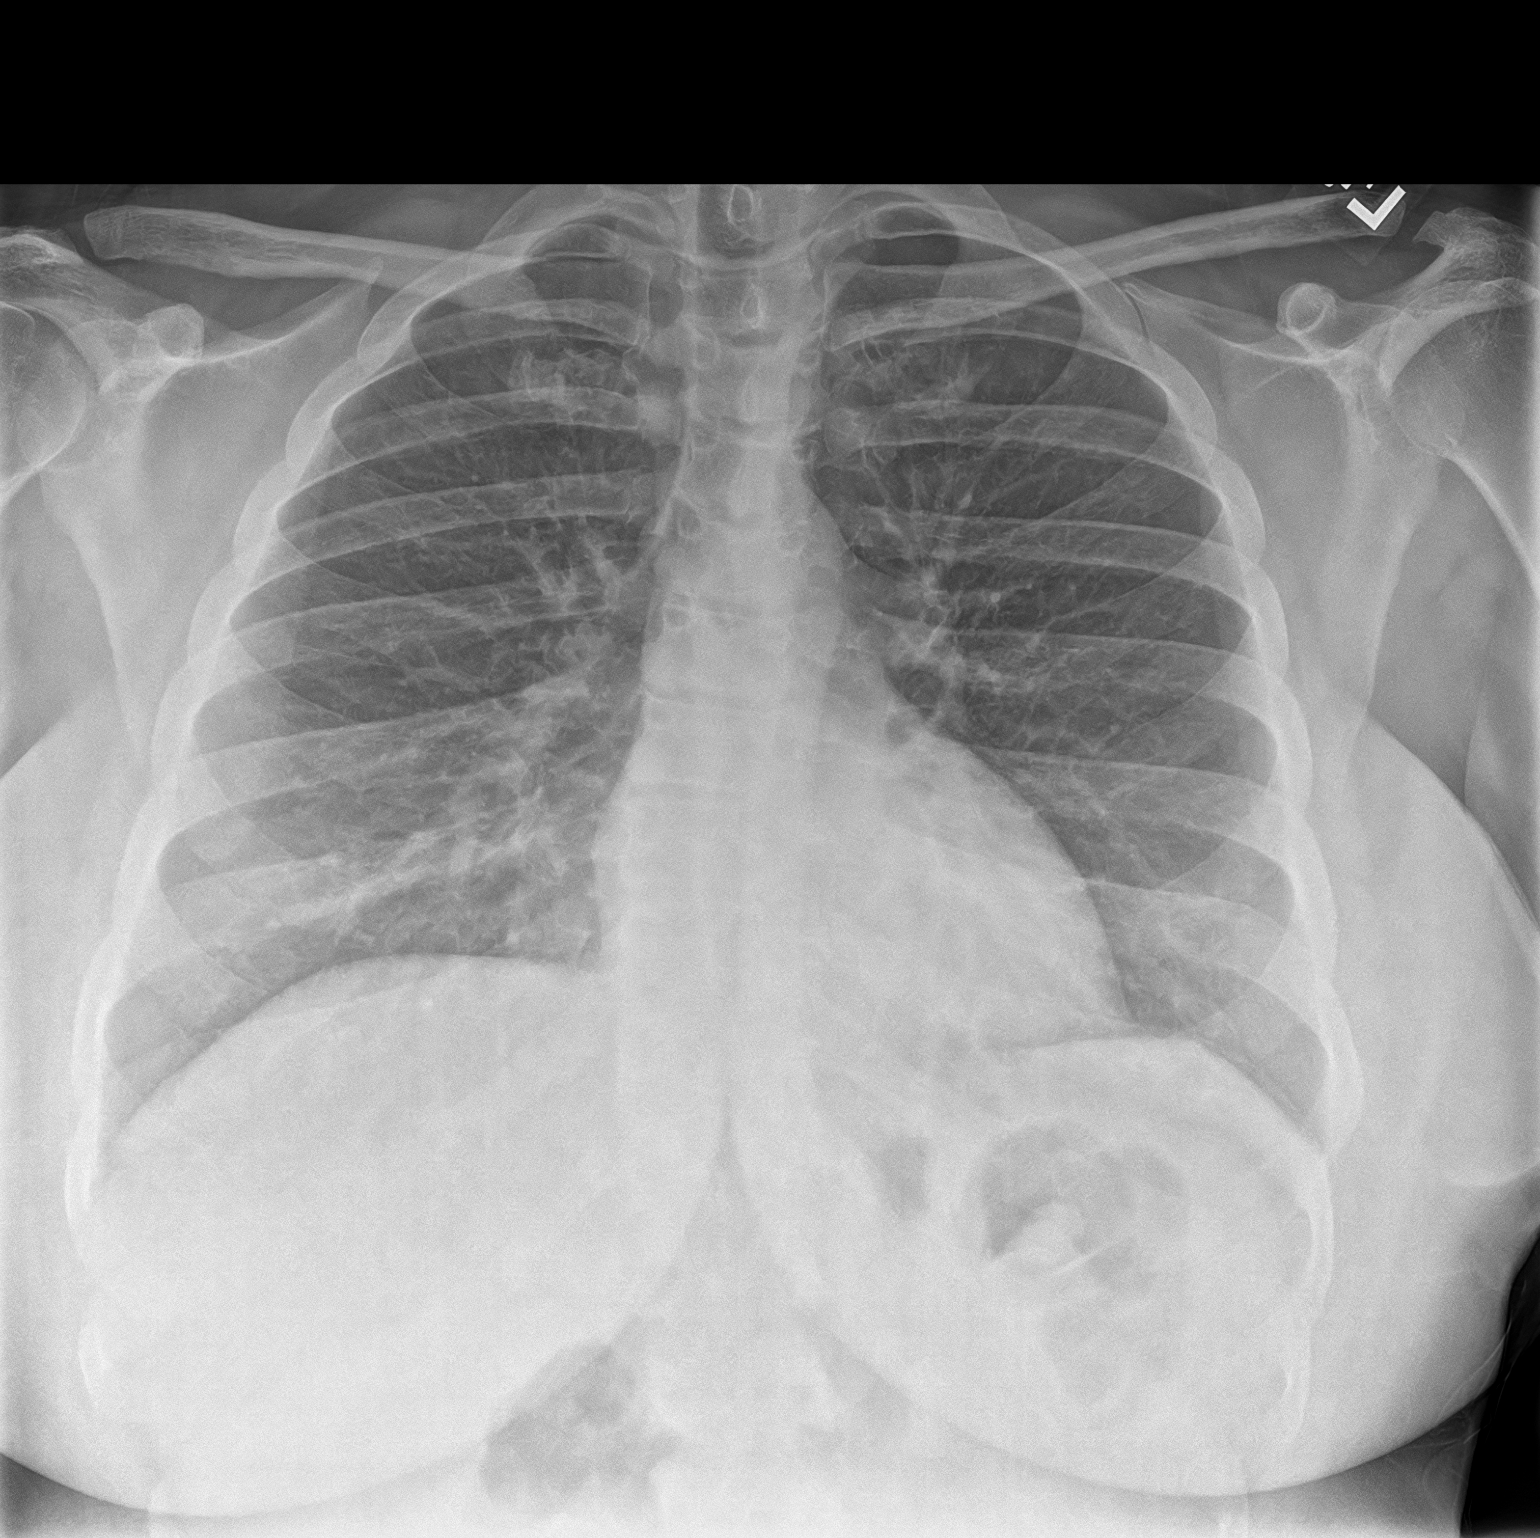

[chest lat]
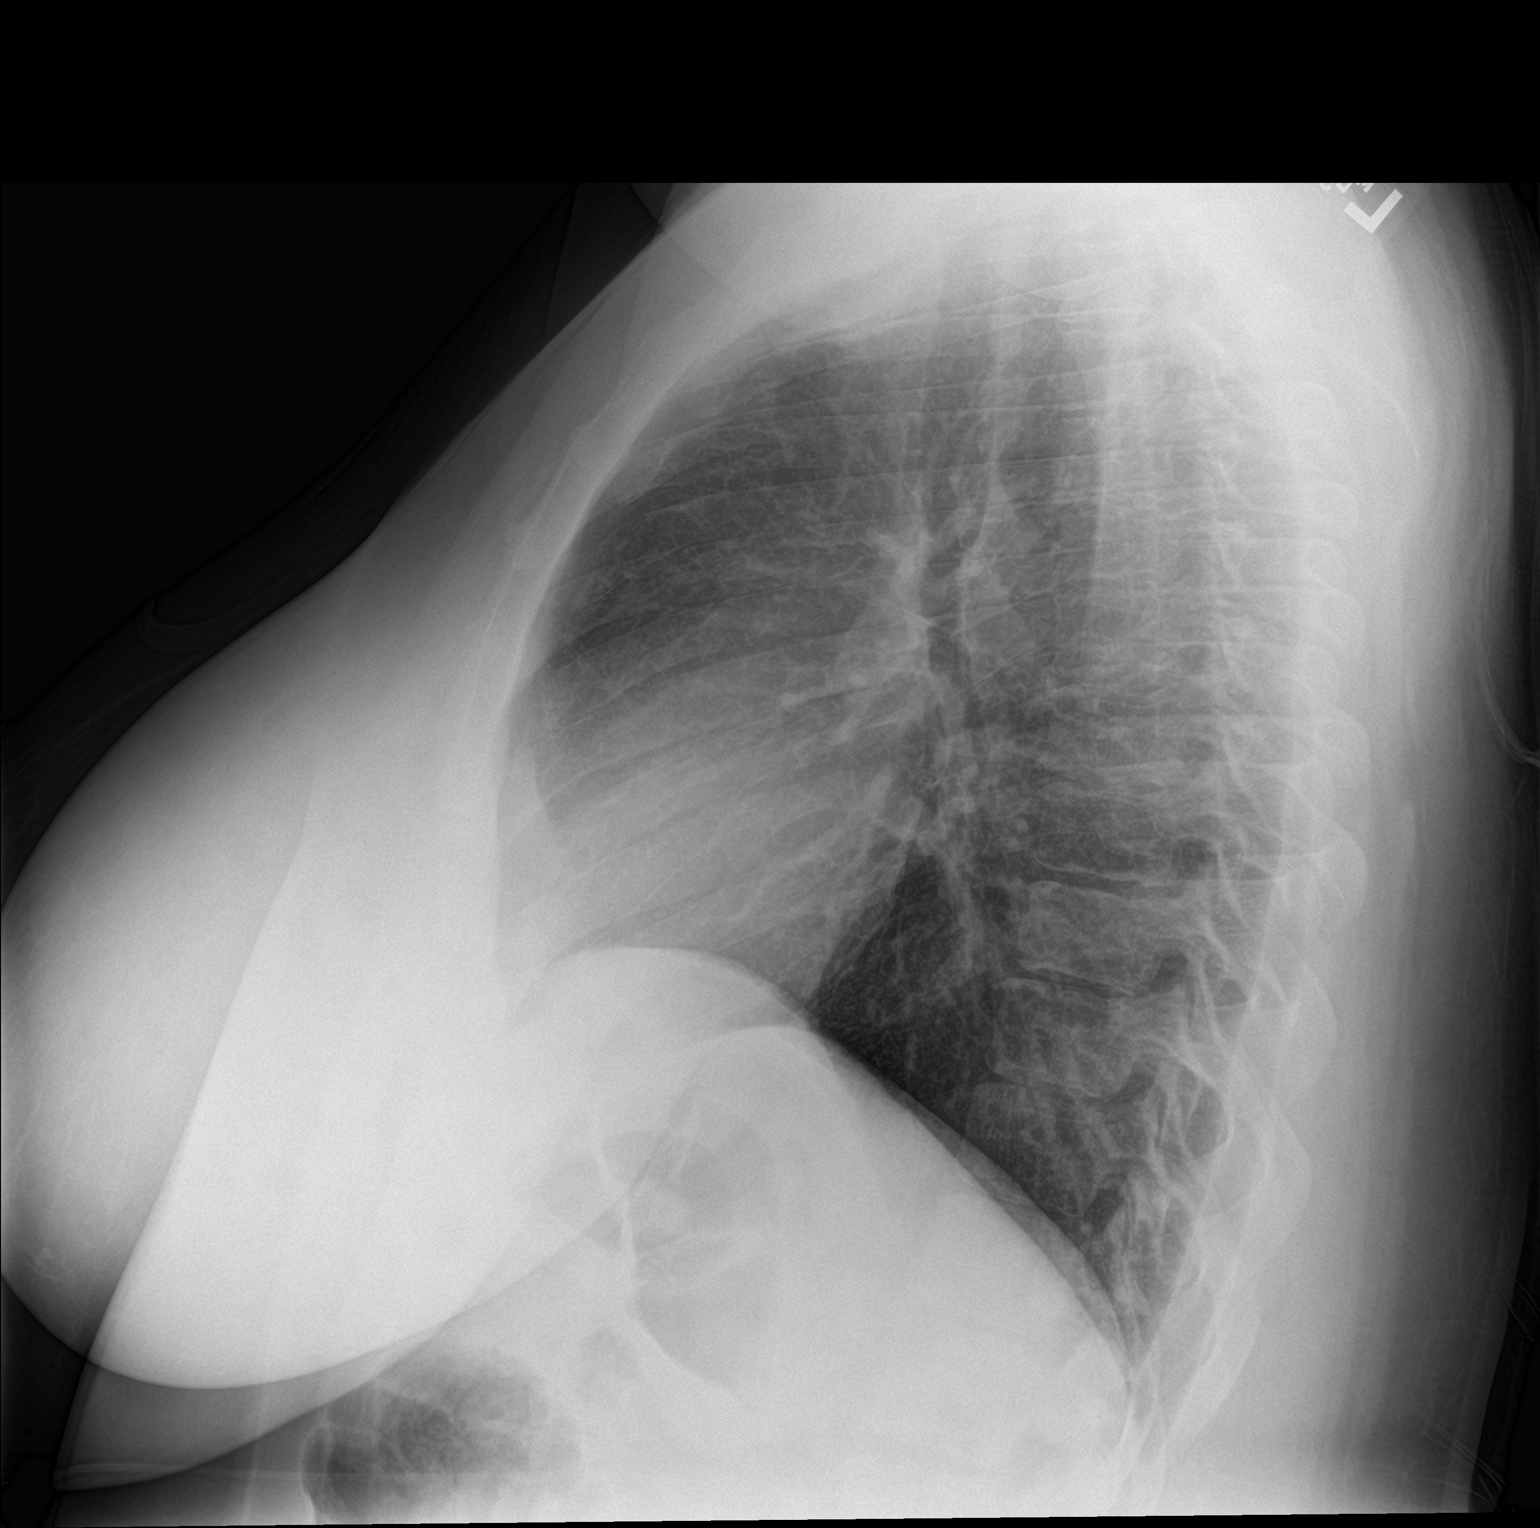

[2 of 2 positions shown; findings below may reference images not displayed]

FINDINGS: Heart size within normal limits. A subtle linear opacity within the
right lung base may reflect pulmonary venous vasculature or linear
atelectasis. No appreciable airspace consolidation. No evidence of
pleural effusion or pneumothorax. No acute bony abnormality
identified.
IMPRESSION: No appreciable airspace consolidation.

A subtle linear opacity within the right lung base may reflect
pulmonary venous vasculature or linear atelectasis.

## 2019-09-13 NOTE — ED Triage Notes (Signed)
Pt comes POV with pos test Monday and SOB. Pt has to take breath in between sentences. VSS.

## 2019-09-13 NOTE — ED Provider Notes (Signed)
[facility] Emergency Department Provider Note   ____________________________________________   First MD Initiated Contact with Patient 09/13/19 2128     (approximate)  I have reviewed the triage vital signs and the nursing notes.   HISTORY  Chief Complaint Shortness of Breath   HPI Sandra Castaneda is a 34 y.o. female who reports she was diagnosed with a positive Covid test on the ninth.  She is becoming increasingly short of breath.  She is not running a fever.  She does feel short of breath if she walks and she has to take a breath between sentences.  She is not having any pain anywhere.  She does have a small amount of swelling in her legs.         Past Medical History:  Diagnosis Date  . Anemia   . Anxiety and depression    lexapro 10 mg no help, zoloft like zombie  . BRCA negative    per pt report, done through [facility]  . Chronic low back pain    11/05/2018 MRI L spine 3. 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 COVID-19    09/11/19  . Heavy menses   . Increased risk of breast cancer    IBIS=25.3%  . Large breasts   . Ovarian cyst 2007    Patient Active Problem List   Diagnosis Date Noted  . Skin excoriation 08/03/2019  . Tobacco abuse 04/19/2019  . Hyperlipidemia 04/19/2019  . Anxiety and depression 01/13/2019  . Vitamin D deficiency 01/13/2019  . Chronic mid back pain 01/13/2019  . Chronic low back pain   . Symptomatic mammary hypertrophy   . Anemia   . Dysmenorrhea 07/22/2016  . Menorrhagia with irregular cycle 07/22/2016  . Annual physical exam 02/04/2016    Past Surgical History:  Procedure Laterality Date  . CESAREAN SECTION  2008   FTP  . CESAREAN SECTION WITH BILATERAL TUBAL LIGATION N/A 02/04/2016   Procedure: CESAREAN SECTION WITH BILATERAL TUBAL LIGATION;  Surgeon: Will Bonnet, MD;  Location: [facility] ORS;  Service: Obstetrics;  Laterality: N/A;  . LAPAROTOMY  2007   ruptured ovarian  cyst Dr. Ammie Dalton     Prior to Admission medications   Medication Sig Start Date End Date Taking? Authorizing Provider  acetaminophen (TYLENOL) 500 MG tablet Take 1 tablet (500 mg total) by mouth every 6 (six) hours as needed. For use AFTER surgery 09/01/19   Threasa Heads, PA-C  Cholecalciferol 1.25 MG (50000 UT) capsule Take 1 capsule (50,000 Units total) by mouth once a week. 02/06/19   McLean-Scocuzza, Nino Glow, MD  ibuprofen (ADVIL) 600 MG tablet Take 1 tablet (600 mg total) by mouth every 6 (six) hours as needed for mild pain or moderate pain. For use AFTER surgery 09/01/19   Phoebe Sharps C, PA-C  ondansetron (ZOFRAN) 4 MG tablet Take 1 tablet (4 mg total) by mouth every 8 (eight) hours as needed for nausea or vomiting. 09/01/19   Young, Johanna C, PA-C    Allergies Chantix [varenicline], Lexapro [escitalopram], and Zoloft [sertraline]  Family History  Problem Relation Age of Onset  . Brain cancer Mother   . Hypertension Mother   . Ovarian cancer Mother 47  . Lung cancer Mother   . Cancer Mother        ovarian to lung to brain  . Diabetes Mellitus I Father   . Pancreatic cancer Father 6  . Cancer Father  stomach cancer  . Diabetes Father   . Hypertension Brother   . Breast cancer Maternal Aunt 43  . Diabetes Mellitus II Paternal Aunt   . Diabetes Mellitus II Paternal Grandmother   . Breast cancer Maternal Aunt 69  . Anxiety disorder Brother     Social History Social History   Tobacco Use  . Smoking status: Current Every Day Smoker    Packs/day: 0.25  . Smokeless tobacco: Never Used  Substance Use Topics  . Alcohol use: No  . Drug use: No    Review of Systems  Constitutional: No fever/chills Eyes: No visual changes. ENT: No sore throat. Cardiovascular: Denies chest pain. Respiratory:  shortness of breath. Gastrointestinal: No abdominal pain.  No nausea, no vomiting.  No diarrhea.  No constipation. Genitourinary: Negative for  dysuria. Musculoskeletal: Negative for back pain. Skin: Negative for rash. Neurological: Negative for headaches, focal weakness    ____________________________________________   PHYSICAL EXAM:  VITAL SIGNS: ED Triage Vitals [09/13/19 1317]  Enc Vitals Group     BP (!) 144/75     Pulse Rate 84     Resp 18     Temp 98.9 F (37.2 C)     Temp Source Oral     SpO2 99 %     Weight 251 lb 5.2 oz (114 kg)     Height '5\' 11"'$  (1.803 m)     Head Circumference      Peak Flow      Pain Score 0     Pain Loc      Pain Edu?      Excl. in [place]?    Constitutional: Alert and oriented.  Does appear to be breathing a little hard Eyes: Conjunctivae are normal.  Head: Atraumatic. Nose: No congestion/rhinnorhea. Mouth/Throat: Mucous membranes are moist.  Oropharynx non-erythematous. Neck: No stridor.  Cardiovascular: Normal rate, regular rhythm. Grossly normal heart sounds.  Good peripheral circulation. Respiratory: Normal respiratory effort.  No retractions. Lungs CTAB. Gastrointestinal: Soft and nontender. No distention. No abdominal bruits. Musculoskeletal: No lower extremity tenderness edema.   Neurologic:  Normal speech and language. No gross focal neurologic deficits are appreciated. No gait instability. Skin:  Skin is warm, dry and intact. No rash noted.   ____________________________________________   LABS (all labs ordered are listed, but only abnormal results are displayed)  Labs Reviewed  BASIC METABOLIC PANEL  CBC  BRAIN NATRIURETIC PEPTIDE  POC URINE PREG, ED  TROPONIN I (HIGH SENSITIVITY)   ____________________________________________  EKG  EKG read interpreted by me shows normal sinus rhythm rate of 91 normal axis essentially normal EKG ____________________________________________  RADIOLOGY  ED MD interpretation: Chest x-ray read by radiology reviewed by me does not show much except for that linear streak in the right lower lung which may be CHF or atelectasis per  radiology I agree. Official radiology report(s): DG Chest 2 View  Result Date: 09/13/2019 CLINICAL DATA:  Shortness of breath. Positive test Monday and shortness of breath. EXAM: CHEST - 2 VIEW COMPARISON:  No pertinent prior exams are available for comparison. FINDINGS: Heart size within normal limits. A subtle linear opacity within the right lung base may reflect pulmonary venous vasculature or linear atelectasis. No appreciable airspace consolidation. No evidence of pleural effusion or pneumothorax. No acute bony abnormality identified. IMPRESSION: No appreciable airspace consolidation. A subtle linear opacity within the right lung base may reflect pulmonary venous vasculature or linear atelectasis. Electronically Signed   By: Kellie Simmering DO   On: 09/13/2019 14:23  ____________________________________________   PROCEDURES  Procedure(s) performed (including Critical Care):  Procedures   ____________________________________________   INITIAL IMPRESSION / ASSESSMENT AND PLAN / ED COURSE  Patient is not hypoxic and we cannot get her hypoxic even with night a lot of walking back and forth.  Chest x-ray does not look bad.  I have secure chatted with [facility] to see if she would be a candidate for remdesivir or monoclonal antibody infusion.  I know that they do not have a space in clinic tomorrow but they may after that.  I have advised the patient of this.              ____________________________________________   FINAL CLINICAL IMPRESSION(S) / ED DIAGNOSES  Final diagnoses:  Dyspnea, unspecified type  COVID-19     ED Discharge Orders    None       Note:  This document was prepared using Dragon voice recognition software and may include unintentional dictation errors.    Nena Polio, MD 09/13/19 [phone]

## 2019-09-13 NOTE — Discharge Instructions (Addendum)
Please return if you are worse at all.  We will get you in the hospital if you need oxygen.  If you get weak or lightheaded or get a fever please feel free to return for check as well.  We will get you in the hospital when you need oxygen.  I have sent a message to the [facility].  They are currently out of the office for the night.  There is post to get back in at 7 in the morning.  If if they have not contacted you and you call here after 3 in the afternoon and I will be back here.  I will check and see if they sent me anything.

## 2019-09-13 NOTE — ED Notes (Signed)
o2 levels to 94% while ambulating

## 2019-09-14 ENCOUNTER — Encounter: Payer: Self-pay | Admitting: Internal Medicine

## 2019-09-14 ENCOUNTER — Other Ambulatory Visit: Payer: Self-pay | Admitting: Internal Medicine

## 2019-09-14 DIAGNOSIS — U071 COVID-19: Secondary | ICD-10-CM

## 2019-09-14 DIAGNOSIS — J1282 Pneumonia due to coronavirus disease 2019: Secondary | ICD-10-CM

## 2019-09-14 MED ORDER — HYDROCOD POLST-CPM POLST ER 10-8 MG/5ML PO SUER: 5.0000 mL | Freq: Every evening | ORAL | 0 refills | Status: DC | PRN

## 2019-09-14 MED ORDER — AZITHROMYCIN 250 MG PO TABS: ORAL_TABLET | ORAL | 0 refills | Status: DC

## 2019-09-14 MED ORDER — ALBUTEROL SULFATE HFA 108 (90 BASE) MCG/ACT IN AERS: 1.0000 | INHALATION_SPRAY | RESPIRATORY_TRACT | 0 refills | Status: DC | PRN

## 2019-09-14 NOTE — Telephone Encounter (Signed)
Called and left patient's information and symptoms with the Covid antibody infusion line.

## 2019-09-15 ENCOUNTER — Telehealth: Payer: Self-pay | Admitting: Nurse Practitioner

## 2019-09-15 NOTE — Telephone Encounter (Signed)
I agree to your chart fee, anything to make me feel better sooner.   Thank you for checking in the infusion for me. Do I need to set up an appointment with you once covid passes to everything is back to normal?   Thank you for the quick response!       Covid  Tilford Pillar, CMA Yesterday (4:46 PM)     Called and left patient's information and symptoms with the Covid antibody infusion line.       Documentation   Tilford Pillar, CMA  Infussion Line Yesterday (4:45 PM)  Delena Bali Yesterday (4:44 PM)   I agree to your chart fee, anything to make me feel better sooner.   Thank you for checking in the infusion for me. Do I need to set up an appointment with you once covid passes to everything is back to normal?   Thank you for the quick response!   You  Eddie Candle, Paulino Door (4:41 PM)  TM If you are agreeable to a my chart fee we can send in medications for your symptoms which would be an inhaler and cough medication to take at night   Mucinex DM green label for cough is over the counter you can buy this as well as warm tea with honey and lemon   We will check on infusion for you   Let me know if you are agreeable to the my chart fee for medications   If worsening you will need to go back to the ED    Eddie Candle, Benetta Spar L  You Yesterday (4:29 PM)   Good evening. I went to the ER yesterday and saw Dr. Tamera Punt. At first he was talking about admitting me due to oxygen levels but it stabilized so he let me go home. He spoke with a group/ department about giving me an infusion for covid, they are supposed to call me and schedule the appointment today or tomorrow.   My cough has gotten worse, lots of body aches, weakness and fatigue, my throat and chest feel like I swallowed a cactus, and I have an intense headache behind my eye down my neck and upper back.  I have also lost some taste,  I can taste sweet things but nothing savory.   I also spoke  with a [place] at [employer] specialist and she asked that I get in contact with you to see if there is some medication you can prescribe for my cough or breathing, the cough is getting worse as the day passes.    You  Molenda, [place] L 2 days ago  TM Ok feel better soon   Anina, Schnake  You 2 days ago   Yes ma'am.  Thank you! I will keep you updated.   You  Alvin Critchley L 2 days ago  TM Please go to [facility] emergency room, [facility] clinic urgent care to be seen now or [facility] emergency room as soon as possible      Tilford Pillar, CMA  You 2 days ago   Please advise, would you like the Patient to go to [facility] for chest x-rays and evaluation?    Routing comment   Tenisha, Fleece  You 2 days ago   I am feeling very weak. I can hear the breathing issues when I talk and my chest and back feel like I have pneumonia. I have a cough but it's not persistent. I feel a  stream of flem just sitting from my mouth down my esophagus. I have a low grade fever and body aches.    Tilford Pillar, CMA  [place], [place] L 2 days ago   Good morning,  I am sorry that you are not feeling well. According to Dr French Ana McLean-Scocuzza, "There is no medication other than over the counter meds: Mucinex dm green label for cough. Vitamin C 1000 mg daily. Vitamin D3 4000 Iu (units) daily. Zinc 100 mg daily. Quercetin 250-500 mg 2 times per day  Monitor pulse ox, buy from [facility] if oxygen is less than 90 please go to the hospital.   Are you feeling really sick? Shortness of breath, cough, chest pain? If so let me know  If worsening go to hospital."     You routed conversation to Tilford Pillar, CMA 2 days ago   Tilford Pillar, [place]  You 2 days ago   Please advise    Routing comment   Jearline, Hirschhorn  You 2 days ago   Good morning,  I had a covid test performed for pre-op and the results came back positive. At first I had no symptoms but I am starting to:  headache from under my right eye down my neck, scratchy throat, my breathing is becoming a little delayed and heavy (kind of feels like it did when I had pneumonia years back) and my lower extremities have a tingly numb feeling. I am messaging to update you and possibly get some advise on what I need to do. I have been taking Tylenol, Ibuprofen and cough syrup but it isn't helping.     I agree to your chart fee, anything to make me feel better sooner.   Thank you for checking in the infusion for me. Do I need to set up an appointment with you once covid passes to everything is back to normal?   Thank you for the quick response!       Covid  Tilford Pillar, CMA Yesterday (4:46 PM)     Called and left patient's information and symptoms with the Covid antibody infusion line.       Documentation   Tilford Pillar, CMA  Infussion Line Yesterday (4:45 PM)  Delena Bali Yesterday (4:44 PM)   I agree to your chart fee, anything to make me feel better sooner.   Thank you for checking in the infusion for me. Do I need to set up an appointment with you once covid passes to everything is back to normal?   Thank you for the quick response!   You  Eddie Candle, Paulino Door (4:41 PM)  TM If you are agreeable to a my chart fee we can send in medications for your symptoms which would be an inhaler and cough medication to take at night   Mucinex DM green label for cough is over the counter you can buy this as well as warm tea with honey and lemon   We will check on infusion for you   Let me know if you are agreeable to the my chart fee for medications   If worsening you will need to go back to the ED    Eddie Candle, Benetta Spar L  You Yesterday (4:29 PM)   Good evening. I went to the ER yesterday and saw Dr. Tamera Punt. At first he was talking about admitting me due to oxygen levels but it stabilized so he let me go home. He spoke  with a group/ department about giving me an  infusion for covid, they are supposed to call me and schedule the appointment today or tomorrow.   My cough has gotten worse, lots of body aches, weakness and fatigue, my throat and chest feel like I swallowed a cactus, and I have an intense headache behind my eye down my neck and upper back.  I have also lost some taste,  I can taste sweet things but nothing savory.   I also spoke with a [place] at Work specialist and she asked that I get in contact with you to see if there is some medication you can prescribe for my cough or breathing, the cough is getting worse as the day passes.    You  Gosch, Turkey L 2 days ago  TM Ok feel better soon   Christiane, Sistare  You 2 days ago   Yes ma'am.  Thank you! I will keep you updated.   You  Alvin Critchley L 2 days ago  TM Please go to [facility] emergency room, [facility] clinic urgent care to be seen now or [facility] emergency room as soon as possible      Tilford Pillar, CMA  You 2 days ago   Please advise, would you like the Patient to go to [facility] for chest x-rays and evaluation?    Routing comment   Jkayla, Spiewak  You 2 days ago   I am feeling very weak. I can hear the breathing issues when I talk and my chest and back feel like I have pneumonia. I have a cough but it's not persistent. I feel a stream of flem just sitting from my mouth down my esophagus. I have a low grade fever and body aches.    Tilford Pillar, CMA  Ricciardelli, [place] L 2 days ago   Good morning,  I am sorry that you are not feeling well. According to Dr French Ana McLean-Scocuzza, "There is no medication other than over the counter meds: Mucinex dm green label for cough. Vitamin C 1000 mg daily. Vitamin D3 4000 Iu (units) daily. Zinc 100 mg daily. Quercetin 250-500 mg 2 times per day  Monitor pulse ox, buy from [facility] if oxygen is less than 90 please go to the hospital.   Are you feeling really sick? Shortness of breath, cough, chest  pain? If so let me know  If worsening go to hospital."     You routed conversation to Tilford Pillar, CMA 2 days ago   Tilford Pillar, [place]  You 2 days ago   Please advise    Routing comment   Clementine, Soulliere  You 2 days ago   Good morning,  I had a covid test performed for pre-op and the results came back positive. At first I had no symptoms but I am starting to: headache from under my right eye down my neck, scratchy throat, my breathing is becoming a little delayed and heavy (kind of feels like it did when I had pneumonia years back) and my lower extremities have a tingly numb feeling. I am messaging to update you and possibly get some advise on what I need to do. I have been taking Tylenol, Ibuprofen and cough syrup but it isn't helping.     I agree to your chart fee, anything to make me feel better sooner.   Thank you for checking in the infusion for me. Do I need to set up an appointment  with you once covid passes to everything is back to normal?   Thank you for the quick response!       Covid  Tilford Pillar, CMA Yesterday (4:46 PM)     Called and left patient's information and symptoms with the Covid antibody infusion line.       Documentation   Tilford Pillar, CMA  Infussion Line Yesterday (4:45 PM)  Delena Bali Yesterday (4:44 PM)   I agree to your chart fee, anything to make me feel better sooner.   Thank you for checking in the infusion for me. Do I need to set up an appointment with you once covid passes to everything is back to normal?   Thank you for the quick response!   You  Eddie Candle, Paulino Door (4:41 PM)  TM If you are agreeable to a my chart fee we can send in medications for your symptoms which would be an inhaler and cough medication to take at night   Mucinex DM green label for cough is over the counter you can buy this as well as warm tea with honey and lemon   We will check on infusion for you   Let  me know if you are agreeable to the my chart fee for medications   If worsening you will need to go back to the ED    Eddie Candle, Benetta Spar L  You Yesterday (4:29 PM)   Good evening. I went to the ER yesterday and saw Dr. Tamera Punt. At first he was talking about admitting me due to oxygen levels but it stabilized so he let me go home. He spoke with a group/ department about giving me an infusion for covid, they are supposed to call me and schedule the appointment today or tomorrow.   My cough has gotten worse, lots of body aches, weakness and fatigue, my throat and chest feel like I swallowed a cactus, and I have an intense headache behind my eye down my neck and upper back.  I have also lost some taste,  I can taste sweet things but nothing savory.   I also spoke with a [place] at Work specialist and she asked that I get in contact with you to see if there is some medication you can prescribe for my cough or breathing, the cough is getting worse as the day passes.    You  Checo, Turkey L 2 days ago  TM Ok feel better soon   Zuley, Lutter  You 2 days ago   Yes ma'am.  Thank you! I will keep you updated.   You  Alvin Critchley L 2 days ago  TM Please go to [facility] emergency room, [facility] clinic urgent care to be seen now or [facility] emergency room as soon as possible      Tilford Pillar, CMA  You 2 days ago   Please advise, would you like the Patient to go to [facility] for chest x-rays and evaluation?    Routing comment   Malika, Demario  You 2 days ago   I am feeling very weak. I can hear the breathing issues when I talk and my chest and back feel like I have pneumonia. I have a cough but it's not persistent. I feel a stream of flem just sitting from my mouth down my esophagus. I have a low grade fever and body aches.    Tilford Pillar, CMA  Eddie Candle, [place] L 2  days ago   Good morning,  I am sorry that you are not feeling well. According to Dr  French Anaracy McLean-Scocuzza, "There is no medication other than over the counter meds: Mucinex dm green label for cough. Vitamin C 1000 mg daily. Vitamin D3 4000 Iu (units) daily. Zinc 100 mg daily. Quercetin 250-500 mg 2 times per day  Monitor pulse ox, [facility] if oxygen is less than 90 please go to the hospital.   Are you feeling really sick? Shortness of breath, cough, chest pain? If so let me know  If worsening go to hospital."     You routed conversation to Tilford PillarMiles, Arianna J, CMA 2 days ago   Tilford PillarMiles, Arianna J, [place]  You 2 days ago   Please advise    Routing comment   Pershing CoxCummings, Lylia L  You 2 days ago  Good morning,  I had a covid test performed for pre-op and the results came back positive. At first I had no symptoms but I am starting to: headache from under my right eye down my neck, scratchy throat, my breathing is becoming a little delayed and heavy (kind of feels like it did when I had pneumonia years back) and my lower extremities have a tingly numb feeling. I am messaging to update you and possibly get some advise on what I need to do. I have been taking Tylenol, Ibuprofen and cough syrup but it isn't helping.     I agree to your chart fee, anything to make me feel better sooner.   Thank you for checking in the infusion for me. Do I need to set up an appointment with you once covid passes to everything is back to normal?   Thank you for the quick response!       Covid  Tilford PillarMiles, Arianna J, CMA Yesterday (4:46 PM)     Called and left patient's information and symptoms with the Covid antibody infusion line.       Documentation   Tilford PillarMiles, Arianna J, CMA  Infussion Line Yesterday (4:45 PM)  Delena Baliummings, Thalia L  You Yesterday (4:44 PM)   I agree to your chart fee, anything to make me feel better sooner.   Thank you for checking in the infusion for me. Do I need to set up an appointment with you once covid passes to everything is back to normal?   Thank you  for the quick response!   You  Eddie Candleummings, Paulino DoorVictoria L Yesterday (4:41 PM)  TM If you are agreeable to a my chart fee we can send in medications for your symptoms which would be an inhaler and cough medication to take at night   Mucinex DM green label for cough is over the counter you can buy this as well as warm tea with honey and lemon   We will check on infusion for you   Let me know if you are agreeable to the my chart fee for medications   If worsening you will need to go back to the ED    Eddie Candleummings, Benetta SparVictoria L  You Yesterday (4:29 PM)   Good evening. I went to the ER yesterday and saw Dr. Tamera PuntMiranda. At first he was talking about admitting me due to oxygen levels but it stabilized so he let me go home. He spoke with a group/ department about giving me an infusion for covid, they are supposed to call me and schedule the appointment today or tomorrow.   My cough has gotten worse, lots  of body aches, weakness and fatigue, my throat and chest feel like I swallowed a cactus, and I have an intense headache behind my eye down my neck and upper back.  I have also lost some taste,  I can taste sweet things but nothing savory.   I also spoke with a [place] at Work specialist and she asked that I get in contact with you to see if there is some medication you can prescribe for my cough or breathing, the cough is getting worse as the day passes.    You  Shepheard, [place] L 2 days ago  TM Ok feel better soon   Leea, Rambeau  You 2 days ago   Yes ma'am.  Thank you! I will keep you updated.   You  Alvin Critchley L 2 days ago  TM Please go to [facility] emergency room, [facility] clinic urgent care to be seen now or [facility] emergency room as soon as possible      Tilford Pillar, CMA  You 2 days ago   Please advise, would you like the Patient to go to [facility] for chest x-rays and evaluation?    Routing comment   Terril, Amaro  You 2 days ago   I am feeling very  weak. I can hear the breathing issues when I talk and my chest and back feel like I have pneumonia. I have a cough but it's not persistent. I feel a stream of flem just sitting from my mouth down my esophagus. I have a low grade fever and body aches.    Tilford Pillar, CMA  Westman, [place] L 2 days ago   Good morning,  I am sorry that you are not feeling well. According to Dr French Ana McLean-Scocuzza, "There is no medication other than over the counter meds: Mucinex dm green label for cough. Vitamin C 1000 mg daily. Vitamin D3 4000 Iu (units) daily. Zinc 100 mg daily. Quercetin 250-500 mg 2 times per day  Monitor pulse ox, buy from [facility] if oxygen is less than 90 please go to the hospital.   Are you feeling really sick? Shortness of breath, cough, chest pain? If so let me know  If worsening go to hospital."     You routed conversation to Tilford Pillar, CMA 2 days ago   Tilford Pillar, [place]  You 2 days ago   Please advise    Routing comment   Malene, Blaydes  You 2 days ago   Good morning,  I had a covid test performed for pre-op and the results came back positive. At first I had no symptoms but I am starting to: headache from under my right eye down my neck, scratchy throat, my breathing is becoming a little delayed and heavy (kind of feels like it did when I had pneumonia years back) and my lower extremities have a tingly numb feeling. I am messaging to update you and possibly get some advise on what I need to do. I have been taking Tylenol, Ibuprofen and cough syrup but it isn't helping.     A/p  1. covid 19 Seen in the ed and still c/o sob  Called covid 19 line  Zpack, albuterol inhaler, tussionex   Time spent 15-20 minutes  Dr. French Ana McLean-scocuzza

## 2019-09-15 NOTE — Telephone Encounter (Signed)
Called to discuss with Sandra Castaneda about Covid symptoms and the use of casirivimab/imdevimab, a combination monoclonal antibody infusion for those with mild to moderate Covid symptoms and at a high risk of hospitalization.     Pt is qualified for this infusion at the [facility] infusion center due to co-morbid conditions and/or a member of an at-risk group (BMI 35, smoker, HLD). Per referral notations symptom onset 09/12/19 with body aches, congestion, cough, and shortness of breath.   Unable to reach patient. Mychart message sent.    Patient Active Problem List   Diagnosis Date Noted  . Skin excoriation 08/03/2019  . Tobacco abuse 04/19/2019  . Hyperlipidemia 04/19/2019  . Anxiety and depression 01/13/2019  . Vitamin D deficiency 01/13/2019  . Chronic mid back pain 01/13/2019  . Chronic low back pain   . Symptomatic mammary hypertrophy   . Anemia   . Dysmenorrhea 07/22/2016  . Menorrhagia with irregular cycle 07/22/2016  . Annual physical exam 02/04/2016    Willette Alma, AGPCNP-BC

## 2019-09-22 ENCOUNTER — Encounter: Payer: 59 | Admitting: Plastic Surgery

## 2019-09-28 ENCOUNTER — Telehealth: Payer: Self-pay | Admitting: Nurse Practitioner

## 2019-09-28 NOTE — Telephone Encounter (Signed)
She has had Covid since 09/11/19 and had  Zpak, albuterol, Tussionex on 09/14/19.  She should be better by now and dizziness and SOB concerns me for low oxygen.  She needs to be seen in person today either at [facility] or [place] urgent care .

## 2019-09-28 NOTE — Telephone Encounter (Signed)
Patient informed and verbalized understanding.  She will go and be seen at [facility] walk-in once she finds someone to watch her kids.

## 2019-09-29 ENCOUNTER — Ambulatory Visit (INDEPENDENT_AMBULATORY_CARE_PROVIDER_SITE_OTHER): Payer: 59

## 2019-09-29 ENCOUNTER — Ambulatory Visit
Admission: EM | Admit: 2019-09-29 | Discharge: 2019-09-29 | Disposition: A | Discharge status: Home / Self Care | Payer: 59 | Attending: Physician Assistant | Admitting: Physician Assistant

## 2019-09-29 ENCOUNTER — Encounter: Payer: Self-pay | Admitting: Emergency Medicine

## 2019-09-29 ENCOUNTER — Other Ambulatory Visit: Payer: Self-pay

## 2019-09-29 DIAGNOSIS — U071 COVID-19: Secondary | ICD-10-CM

## 2019-09-29 DIAGNOSIS — R7989 Other specified abnormal findings of blood chemistry: Secondary | ICD-10-CM | POA: Insufficient documentation

## 2019-09-29 DIAGNOSIS — R0602 Shortness of breath: Secondary | ICD-10-CM

## 2019-09-29 DIAGNOSIS — Z8616 Personal history of COVID-19: Secondary | ICD-10-CM | POA: Insufficient documentation

## 2019-09-29 DIAGNOSIS — R05 Cough: Secondary | ICD-10-CM | POA: Diagnosis not present

## 2019-09-29 LAB — CBC WITH DIFFERENTIAL/PLATELET
Abs Immature Granulocytes: 0.03 10*3/uL (ref 0.00–0.07)
Basophils Absolute: 0 10*3/uL (ref 0.0–0.1)
Basophils Relative: 0 %
Eosinophils Absolute: 0.1 10*3/uL (ref 0.0–0.5)
Eosinophils Relative: 1 %
HCT: 35.1 % — ABNORMAL LOW (ref 36.0–46.0)
Hemoglobin: 11.9 g/dL — ABNORMAL LOW (ref 12.0–15.0)
Immature Granulocytes: 0 %
Lymphocytes Relative: 28 %
Lymphs Abs: 2.8 10*3/uL (ref 0.7–4.0)
MCH: 29.2 pg (ref 26.0–34.0)
MCHC: 33.9 g/dL (ref 30.0–36.0)
MCV: 86.2 fL (ref 80.0–100.0)
Monocytes Absolute: 0.9 10*3/uL (ref 0.1–1.0)
Monocytes Relative: 9 %
Neutro Abs: 6.1 10*3/uL (ref 1.7–7.7)
Neutrophils Relative %: 62 %
Platelets: 258 10*3/uL (ref 150–400)
RBC: 4.07 MIL/uL (ref 3.87–5.11)
RDW: 13.2 % (ref 11.5–15.5)
WBC: 9.9 10*3/uL (ref 4.0–10.5)
nRBC: 0 % (ref 0.0–0.2)

## 2019-09-29 LAB — BASIC METABOLIC PANEL
Anion gap: 8 (ref 5–15)
BUN: 9 mg/dL (ref 6–20)
CO2: 22 mmol/L (ref 22–32)
Calcium: 8.5 mg/dL — ABNORMAL LOW (ref 8.9–10.3)
Chloride: 110 mmol/L (ref 98–111)
Creatinine, Ser: 0.62 mg/dL (ref 0.44–1.00)
GFR calc Af Amer: >60 mL/min (ref >60)
GFR calc non Af Amer: >60 mL/min (ref >60)
Glucose, Bld: 95 mg/dL (ref 70–99)
Potassium: 3.5 mmol/L (ref 3.5–5.1)
Sodium: 140 mmol/L (ref 135–145)

## 2019-09-29 LAB — FIBRIN DERIVATIVES D-DIMER (ARMC ONLY): Fibrin derivatives D-dimer (ARMC): 549.06 ng/mL (FEU) — ABNORMAL HIGH (ref 0.00–499.00)

## 2019-09-29 LAB — TROPONIN I (HIGH SENSITIVITY): Troponin I (High Sensitivity): <2 ng/L (ref <18)

## 2019-09-29 IMAGING — CR DG CHEST 2V
2 series · 2 of 2 positions shown · non-contrast
Comparison: Chest x-ray dated [DATE].

CLINICAL DATA: Cough and shortness of breath.  [9W] positive.

EXAM:
CHEST - 2 VIEW

[chest pa]
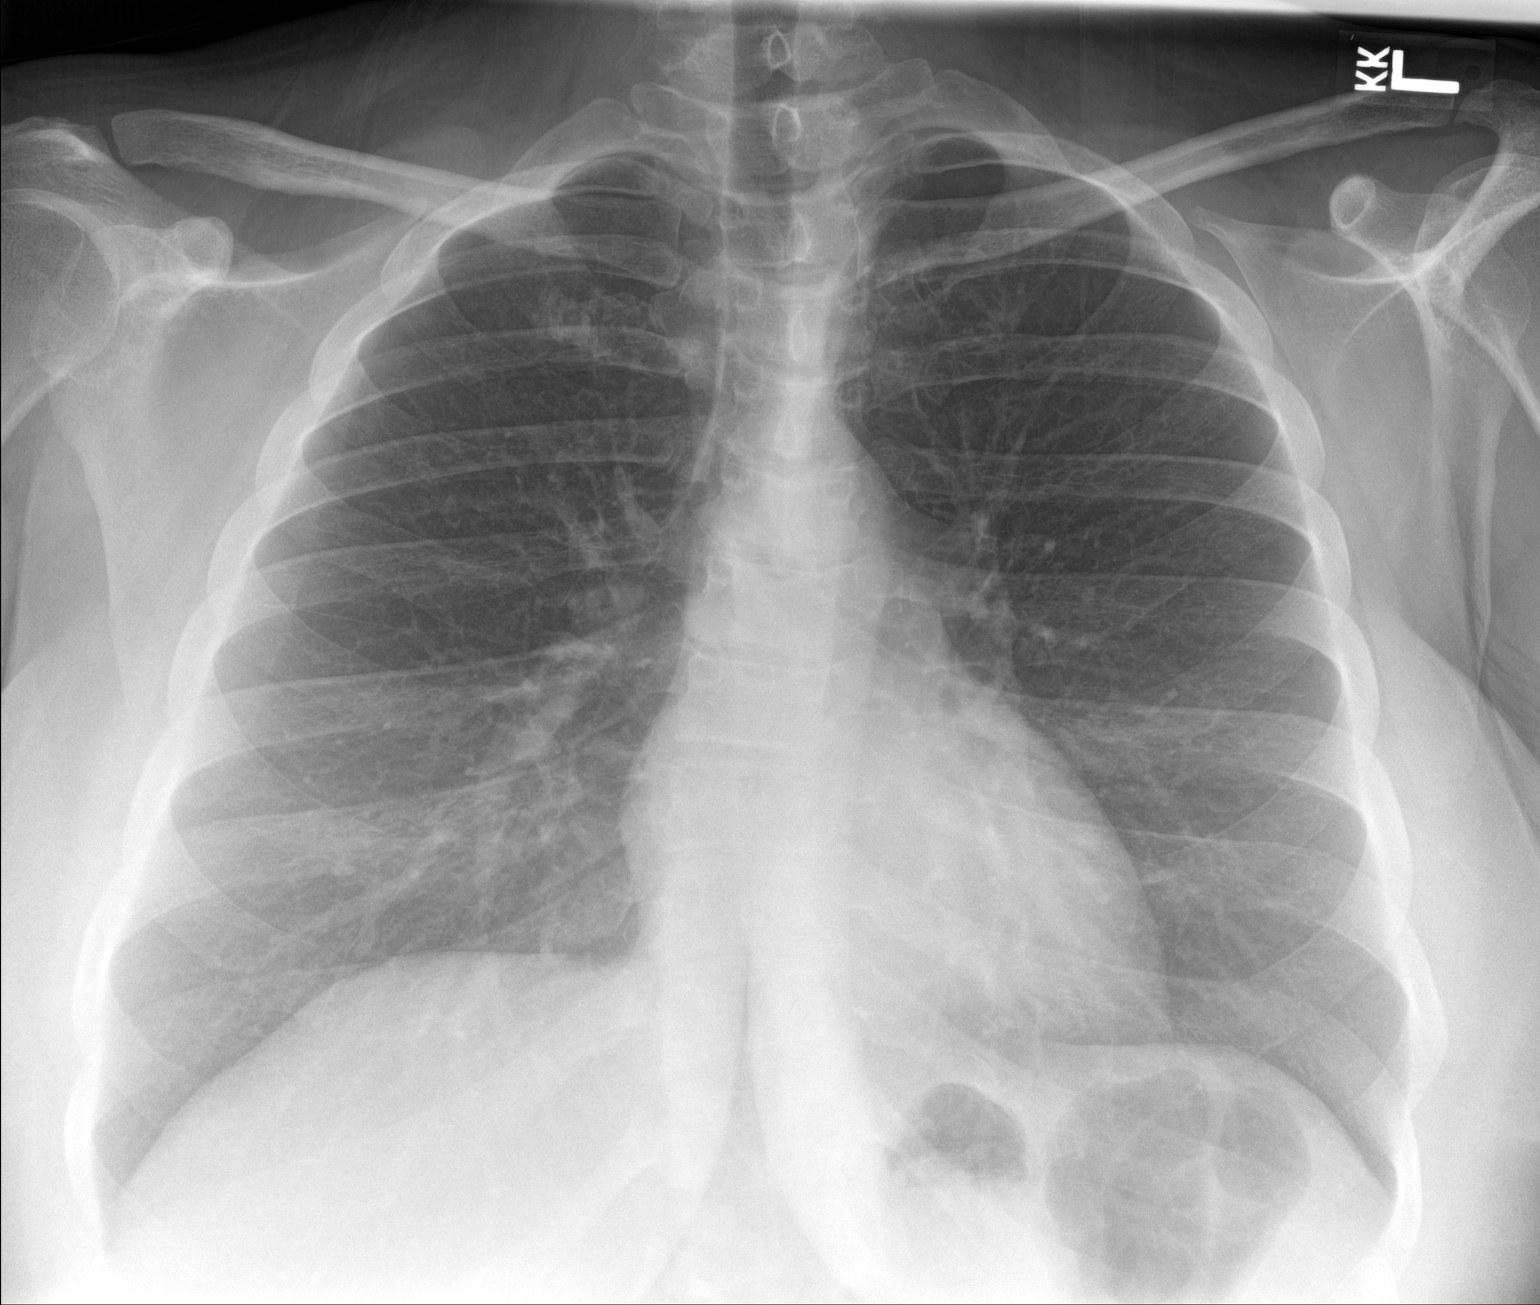

[chest lat]
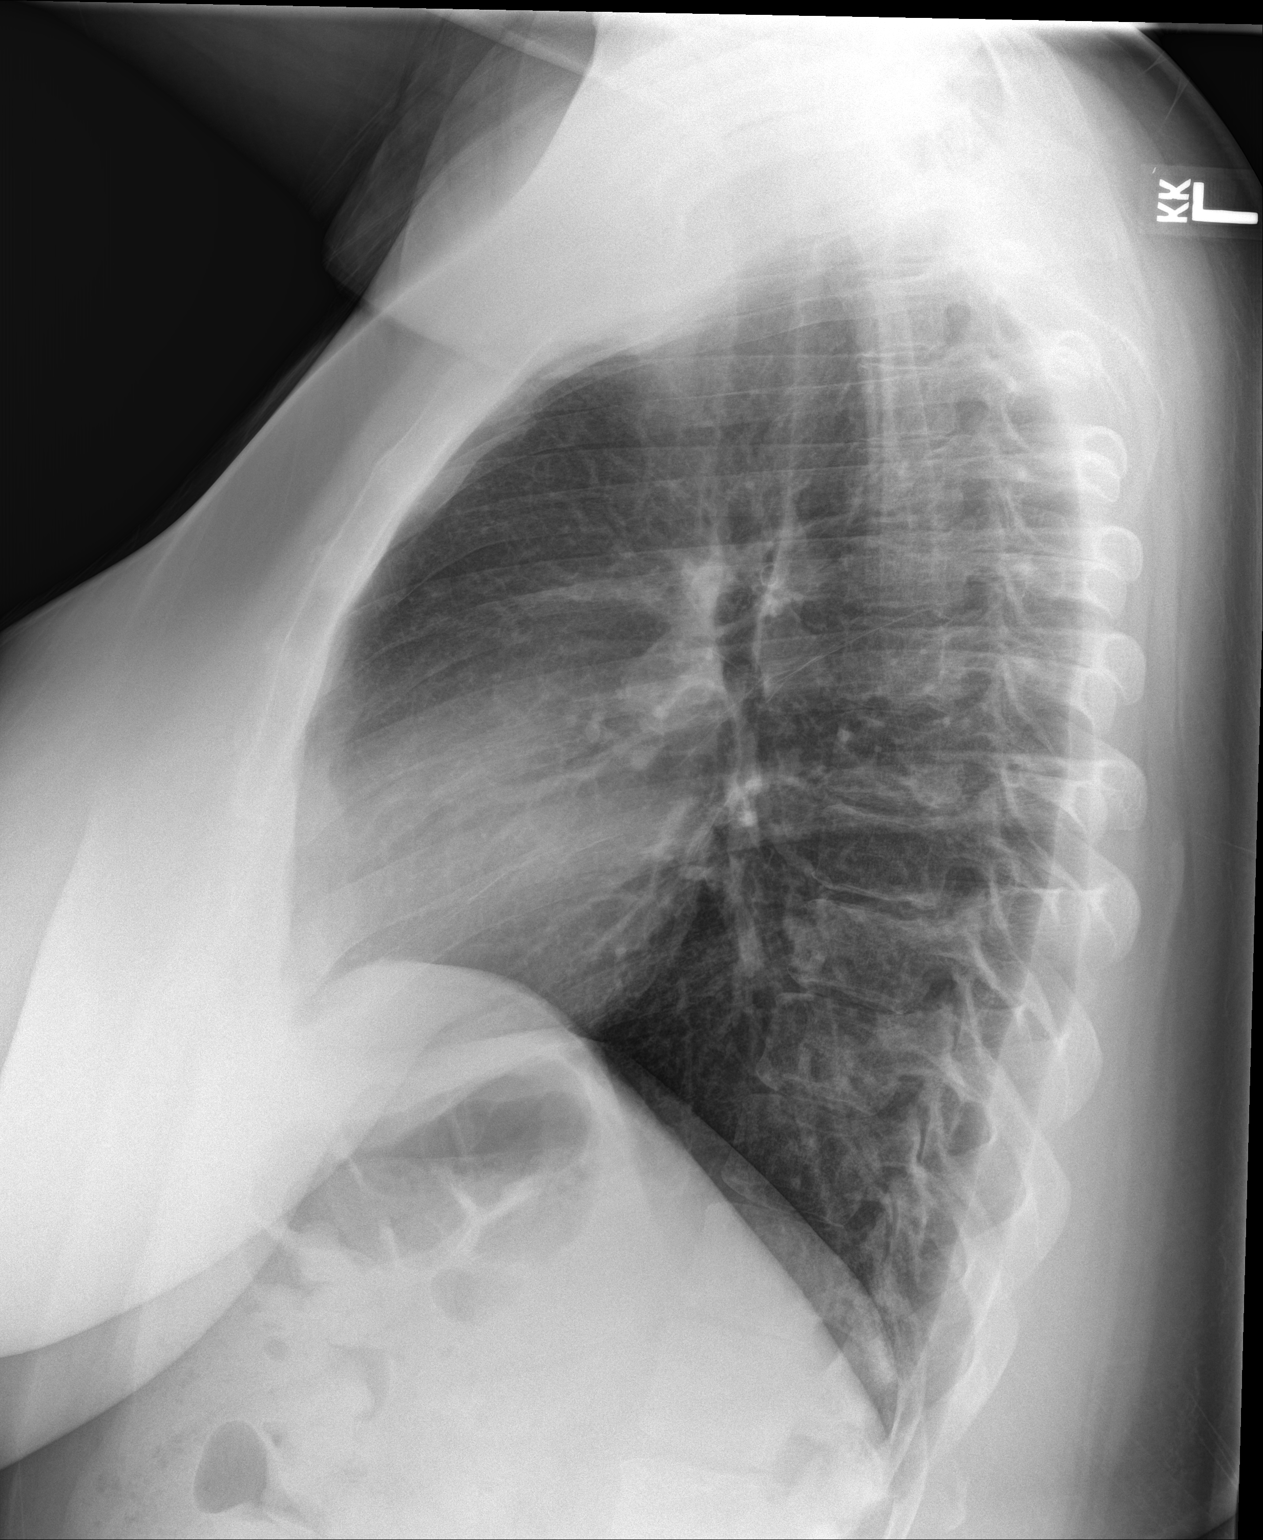

[2 of 2 positions shown; findings below may reference images not displayed]

FINDINGS: The heart size and mediastinal contours are within normal limits.
Both lungs are clear. The visualized skeletal structures are
unremarkable.
IMPRESSION: No active cardiopulmonary disease.

## 2019-09-29 NOTE — ED Triage Notes (Signed)
Patient c/o ongoing cough and chest congestion and SOB since being diagnosed with COVID on 09/11/19.  Patient denies fevers.

## 2019-09-29 NOTE — Discharge Instructions (Addendum)
-  Chest x-ray was normal today.  Other than your heart rate being slow your EKG was also normal today.  Multiple labs were drawn today.  A D-dimer test was elevated today.  If this test is abnormal it must be followed up with a CAT scan of your chest to assess you for possible blood clot in your lungs.  At this time I advised to go to the emergency room to have this performed.  If your CAT scan is normal, your persistent symptoms should still have further work-up since they are distressing you.  You should follow-up with the specialist I have listed below.  A specialist may want to perform pulmonary function testing on you or perhaps get an ultrasound of your heart.  Also please follow-up with your PCP and  whoever treats your anxiety.  Many of your symptoms could actually be due to anxiety related to the situation.  [facility] at [facility] [address]  [phone]  [facility] [address]  [phone]

## 2019-09-29 NOTE — ED Provider Notes (Signed)
[facility]    CSN: [id number] Arrival date & time: 09/29/19  1720      History   Chief Complaint Chief Complaint  Patient presents with  . Shortness of Breath  . Cough    HPI Sandra Castaneda is a 34 y.o. female.   34 year old female for concerns related to shortness of breath.  She says she was diagnosed with Covid a few weeks ago and has had persistent shortness of breath ever since.  Patient also admits to consistent dry cough and fatigue as well.  She says sometimes she will feel some numbness into her left arm.  This has been ongoing for the past couple of weeks.  She denies any fever.  She has no history of heart or lung disease.  Patient says she has been prescribed multiple medications that did not help.  She says she has taken azithromycin, prednisone, and Tussionex.  She says her symptoms have not worsened but they have not improved either.  She denies any new symptoms.  She does not have any other concerns.     Past Medical History:  Diagnosis Date  . Anemia   . Anxiety and depression    lexapro 10 mg no help, zoloft like zombie  . BRCA negative    per pt report, done through [facility]  . Chronic low back pain    11/05/2018 MRI L spine 3. 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 COVID-19    09/11/19  . Heavy menses   . Increased risk of breast cancer    IBIS=25.3%  . Large breasts   . Ovarian cyst 2007    Patient Active Problem List   Diagnosis Date Noted  . Skin excoriation 08/03/2019  . Tobacco abuse 04/19/2019  . Hyperlipidemia 04/19/2019  . Anxiety and depression 01/13/2019  . Vitamin D deficiency 01/13/2019  . Chronic mid back pain 01/13/2019  . Chronic low back pain   . Symptomatic mammary hypertrophy   . Anemia   . Dysmenorrhea 07/22/2016  . Menorrhagia with irregular cycle 07/22/2016  . Annual physical exam 02/04/2016    Past Surgical History:  Procedure Laterality Date  . CESAREAN SECTION  2008   FTP    . CESAREAN SECTION WITH BILATERAL TUBAL LIGATION N/A 02/04/2016   Procedure: CESAREAN SECTION WITH BILATERAL TUBAL LIGATION;  Surgeon: Will Bonnet, MD;  Location: [facility] ORS;  Service: Obstetrics;  Laterality: N/A;  . LAPAROTOMY  2007   ruptured ovarian cyst Dr. Ammie Dalton     OB History    Gravida  2   Para  2   Term  2   Preterm      AB      Living  2     SAB      TAB      Ectopic      Multiple      Live Births  2            Home Medications    Prior to Admission medications   Medication Sig Start Date End Date Taking? Authorizing Provider  albuterol (VENTOLIN HFA) 108 (90 Base) MCG/ACT inhaler Inhale 1-2 puffs into the lungs every 4 (four) hours as needed for wheezing or shortness of breath. 09/14/19  Yes McLean-Scocuzza, Nino Glow, MD  Cholecalciferol 1.25 MG (50000 UT) capsule Take 1 capsule (50,000 Units total) by mouth once a week. 02/06/19  Yes McLean-Scocuzza, Nino Glow, MD  acetaminophen (TYLENOL) 500 MG tablet Take 1  tablet (500 mg total) by mouth every 6 (six) hours as needed. For use AFTER surgery 09/01/19   Phoebe Sharps C, PA-C  azithromycin (ZITHROMAX) 250 MG tablet 2 pills day 1 and 1 pill day 2-5 09/14/19   McLean-Scocuzza, Nino Glow, MD  chlorpheniramine-HYDROcodone (TUSSIONEX PENNKINETIC ER) 10-8 MG/5ML SUER Take 5 mLs by mouth at bedtime as needed for cough. 09/14/19   McLean-Scocuzza, Nino Glow, MD  ibuprofen (ADVIL) 600 MG tablet Take 1 tablet (600 mg total) by mouth every 6 (six) hours as needed for mild pain or moderate pain. For use AFTER surgery 09/01/19   Phoebe Sharps C, PA-C  ondansetron (ZOFRAN) 4 MG tablet Take 1 tablet (4 mg total) by mouth every 8 (eight) hours as needed for nausea or vomiting. 09/01/19   Threasa Heads, PA-C    Family History Family History  Problem Relation Age of Onset  . Brain cancer Mother   . Hypertension Mother   . Ovarian cancer Mother 31  . Lung cancer Mother   . Cancer Mother        ovarian to lung to brain   . Diabetes Mellitus I Father   . Pancreatic cancer Father 65  . Cancer Father        stomach cancer  . Diabetes Father   . Hypertension Brother   . Breast cancer Maternal Aunt 14  . Diabetes Mellitus II Paternal Aunt   . Diabetes Mellitus II Paternal Grandmother   . Breast cancer Maternal Aunt 70  . Anxiety disorder Brother     Social History Social History   Tobacco Use  . Smoking status: Current Every Day Smoker    Packs/day: 0.25  . Smokeless tobacco: Never Used  Vaping Use  . Vaping Use: Never used  Substance Use Topics  . Alcohol use: No  . Drug use: No     Allergies   Chantix [varenicline], Lexapro [escitalopram], and Zoloft [sertraline]   Review of Systems Review of Systems  Constitutional: Positive for fatigue. Negative for chills, diaphoresis and fever.  HENT: Positive for congestion. Negative for ear pain, rhinorrhea, sinus pressure, sinus pain and sore throat.   Respiratory: Positive for cough and shortness of breath. Negative for wheezing.   Cardiovascular: Negative for chest pain and palpitations.  Gastrointestinal: Negative for abdominal pain, nausea and vomiting.  Musculoskeletal: Negative for arthralgias and myalgias.  Skin: Negative for rash.  Neurological: Positive for headaches. Negative for weakness.  Hematological: Negative for adenopathy.     Physical Exam Triage Vital Signs ED Triage Vitals  Enc Vitals Group     BP 09/29/19 1808 (!) 121/95     Pulse Rate 09/29/19 1808 79     Resp 09/29/19 1808 16     Temp 09/29/19 1808 98.1 F (36.7 C)     Temp Source 09/29/19 1808 Oral     SpO2 09/29/19 1808 100 %     Weight 09/29/19 1804 250 lb (113.4 kg)     Height 09/29/19 1804 $RemoveBefor'5\' 11"'OLTdGlYyMCSP$  (1.803 m)     Head Circumference --      Peak Flow --      Pain Score 09/29/19 1804 7     Pain Loc --      Pain Edu? --      Excl. in [place]? --    No data found.  Updated Vital Signs BP (!) 121/95 (BP Location: Right Arm)   Pulse 79   Temp 98.1 F (36.7 C)  (Oral)   Resp 16  Ht '5\' 11"'$  (1.803 m)   Wt 250 lb (113.4 kg)   LMP 09/06/2019   SpO2 100%   Breastfeeding No   BMI 34.87 kg/m   Physical Exam Vitals and nursing note reviewed.  Constitutional:      General: She is not in acute distress.    Appearance: Normal appearance. She is not ill-appearing or toxic-appearing.  HENT:     Head: Normocephalic and atraumatic.     Nose: Nose normal.     Mouth/Throat:     Mouth: Mucous membranes are moist.     Pharynx: Oropharynx is clear.  Eyes:     General: No scleral icterus.       Right eye: No discharge.        Left eye: No discharge.     Conjunctiva/sclera: Conjunctivae normal.  Cardiovascular:     Rate and Rhythm: Normal rate and regular rhythm.     Heart sounds: Normal heart sounds.  Pulmonary:     Effort: Pulmonary effort is normal. No respiratory distress.     Breath sounds: Normal breath sounds. No wheezing, rhonchi or rales.  Musculoskeletal:     Cervical back: Neck supple.  Skin:    General: Skin is dry.  Neurological:     General: No focal deficit present.     Mental Status: She is alert. Mental status is at baseline.     Motor: No weakness.     Gait: Gait normal.  Psychiatric:        Mood and Affect: Mood normal.        Behavior: Behavior normal.        Thought Content: Thought content normal.      UC Treatments / Results  Labs (all labs ordered are listed, but only abnormal results are displayed) Labs Reviewed  CBC WITH DIFFERENTIAL/PLATELET - Abnormal; Notable for the following components:      Result Value   Hemoglobin 11.9 (*)    HCT 35.1 (*)    All other components within normal limits  BASIC METABOLIC PANEL - Abnormal; Notable for the following components:   Calcium 8.5 (*)    All other components within normal limits  FIBRIN DERIVATIVES D-DIMER ([facility] ONLY) - Abnormal; Notable for the following components:   Fibrin derivatives D-dimer ([facility]) 549.06 (*)    All other components within normal limits    TROPONIN I (HIGH SENSITIVITY)    EKG   Radiology DG Chest 2 View  Result Date: 09/29/2019 CLINICAL DATA:  Cough and shortness of breath.  COVID-19 positive. EXAM: CHEST - 2 VIEW COMPARISON:  Chest x-ray dated September 13, 2019. FINDINGS: The heart size and mediastinal contours are within normal limits. Both lungs are clear. The visualized skeletal structures are unremarkable. IMPRESSION: No active cardiopulmonary disease. Electronically Signed   By: Titus Dubin M.D.   On: 09/29/2019 18:26    Procedures ED EKG  Date/Time: 09/29/2019 7:09 PM Performed by: Danton Clap, PA-C Authorized by: Danton Clap, PA-C   ECG reviewed by ED Physician in the absence of a cardiologist: yes   Previous ECG:    Previous ECG:  Unavailable Interpretation:    Interpretation: normal   Rate:    ECG rate:  52   ECG rate assessment: bradycardic   Rhythm:    Rhythm: sinus rhythm   Ectopy:    Ectopy: none   QRS:    QRS axis:  Normal Conduction:    Conduction: normal   ST segments:    ST  segments:  Normal T waves:    T waves: normal   Comments:     Sinus bradycardia, otherwise normal   (including critical care time)  Medications Ordered in UC Medications - No data to display  Initial Impression / Assessment and Plan / UC Course  I have reviewed the triage vital signs and the nursing notes.  Pertinent labs & imaging results that were available during my care of the patient were reviewed by me and considered in my medical decision making (see chart for details).   34 year old female complaining of persistent Covid symptoms including shortness of breath, fatigue, headaches, and cough had a chest x-ray today that was independently reviewed by me and appears normal.  Overread by radiologist as normal.  All of her vital signs are normal.  Her chest is clear to auscultation and her heart has a regular rate and rhythm.  She is stable and she does not appear to be having shortness of breath or  respiratory distress.  However, since she complains of persistent symptoms further work-up started at this time.  Of note, she was previously seen in the ER around the time of her symptom onset around the time of her symptom onset, on 09/13/2019.  She had a full work-up including chest x-ray, EKG, and cardiac enzymes.  All of her testing was normal.  All her vitals were normal at that time.  EKG has sinus bradycardia otherwise normal.  CBC is essentially normal.  BMP also obtained.  D-dimer also obtained.  Troponin also obtained.  Dimer elevated today.  Advised patient to proceed to ER for evaluation for possible PE.  Patient declines EMS transport.  She plans to drive herself AGAINST MEDICAL ADVICE.  Patient leaving in stable condition.  Advised patient that if CT scan is negative, since she has these persistent symptoms she should still follow up with both cardiology and pulmonology to ensure that there is no issue with her heart or lungs.  Since she has a strong history of anxiety, many of her symptoms could be due to that.  Also advised her to follow-up with her PCP about anxiety, or if she goes to psych follow-up with psych.    Final Clinical Impressions(s) / UC Diagnoses   Final diagnoses:  Shortness of breath  History of COVID-19  Elevated d-dimer     Discharge Instructions     -Chest x-ray was normal today.  Other than your heart rate being slow your EKG was also normal today.  Multiple labs were drawn today.  A D-dimer test was elevated today.  If this test is abnormal it must be followed up with a CAT scan of your chest to assess you for possible blood clot in your lungs.  At this time I advised to go to the emergency room to have this performed.  If your CAT scan is normal, your persistent symptoms should still have further work-up since they are distressing you.  You should follow-up with the specialist I have listed below.  A specialist may want to perform pulmonary function testing  on you or perhaps get an ultrasound of your heart.  Also please follow-up with your PCP and  whoever treats your anxiety.  Many of your symptoms could actually be due to anxiety related to the situation.  [place] [facility] [id number]  [phone]  [facility] [address]  [phone]     ED Prescriptions  None     PDMP not reviewed this encounter.   Danton Clap, PA-C 09/29/19 1958

## 2019-10-02 ENCOUNTER — Other Ambulatory Visit: Payer: Self-pay

## 2019-10-02 ENCOUNTER — Telehealth: Payer: Self-pay | Admitting: Family

## 2019-10-02 ENCOUNTER — Ambulatory Visit
Admission: RE | Admit: 2019-10-02 | Discharge: 2019-10-02 | Disposition: A | Discharge status: Home / Self Care | Payer: 59 | Source: Ambulatory Visit | Attending: Family | Admitting: Family

## 2019-10-02 ENCOUNTER — Telehealth: Payer: Self-pay | Admitting: Internal Medicine

## 2019-10-02 ENCOUNTER — Encounter: Payer: Self-pay | Admitting: Family

## 2019-10-02 DIAGNOSIS — R0602 Shortness of breath: Secondary | ICD-10-CM | POA: Diagnosis not present

## 2019-10-02 DIAGNOSIS — R079 Chest pain, unspecified: Secondary | ICD-10-CM | POA: Diagnosis not present

## 2019-10-02 DIAGNOSIS — M7989 Other specified soft tissue disorders: Secondary | ICD-10-CM | POA: Diagnosis not present

## 2019-10-02 DIAGNOSIS — U071 COVID-19: Secondary | ICD-10-CM

## 2019-10-02 IMAGING — CT CT ANGIO CHEST
2 of 6 series · 18 of 46 positions shown · IV contrast (omnipaque)
Comparison: None.

CLINICAL DATA: Shortness of breath elevated D-dimer COVID positive

EXAM:
CT ANGIOGRAPHY CHEST WITH CONTRAST
TECHNIQUE: Multidetector CT imaging of the chest was performed using the
standard protocol during bolus administration of intravenous
contrast. Multiplanar CT image reconstructions and MIPs were
obtained to evaluate the vascular anatomy.
CONTRAST:  75mL OMNIPAQUE IOHEXOL 350 MG/ML SOLN

[Series 5: thins · axial · 0.80mm/px · z∈[-342,-80]mm · 15 of 288 slices shown]
[im 13/288  lung]
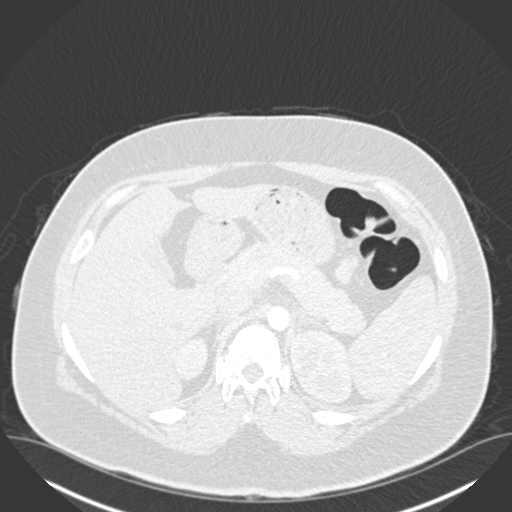
[im 38/288  soft-tissue]
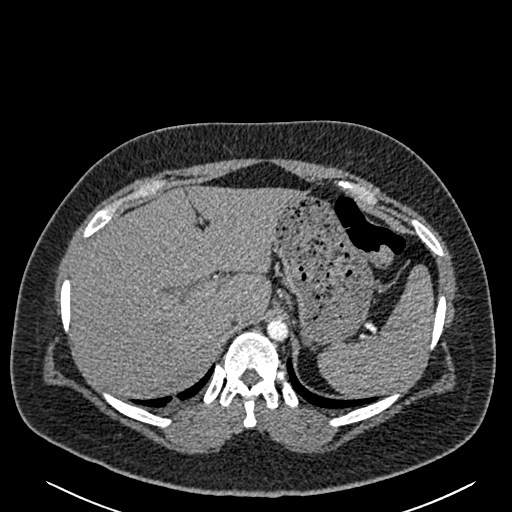
[im 50/288  lung]
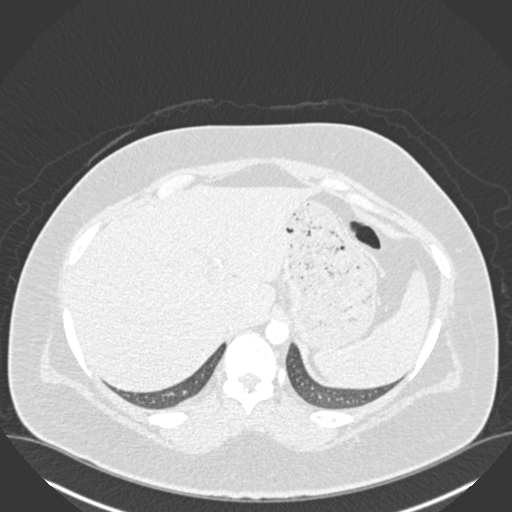
[im 75/288  soft-tissue]
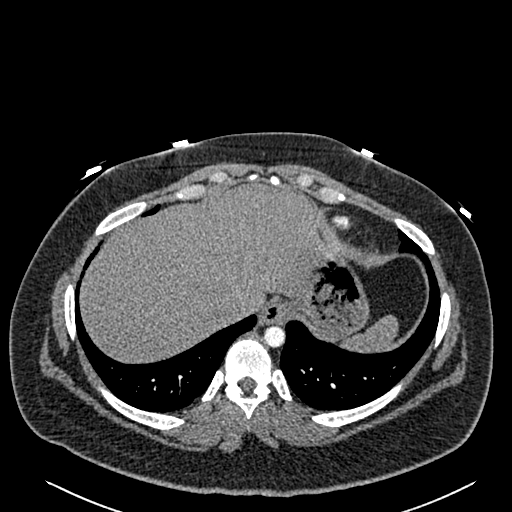
[im 88/288  lung]
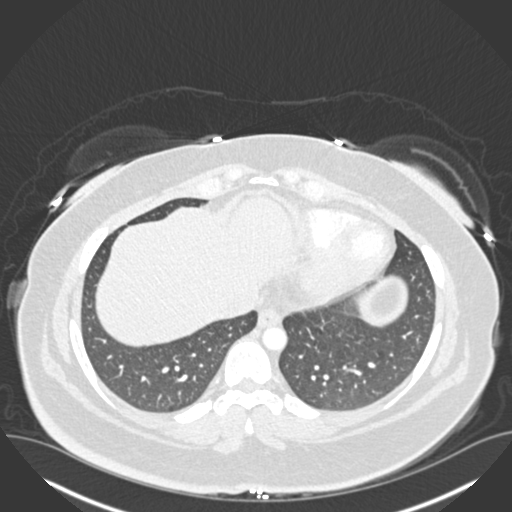
[im 113/288  soft-tissue]
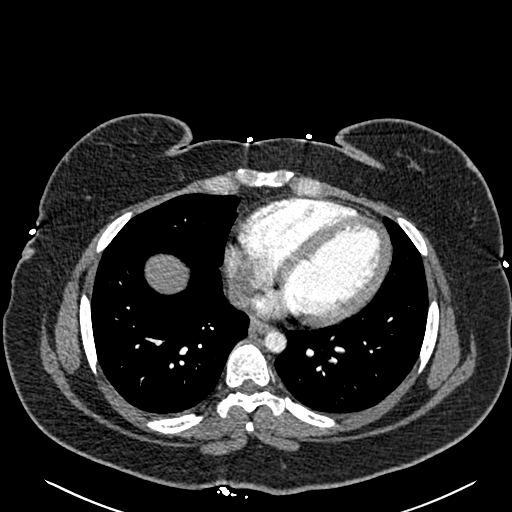
[im 125/288  lung]
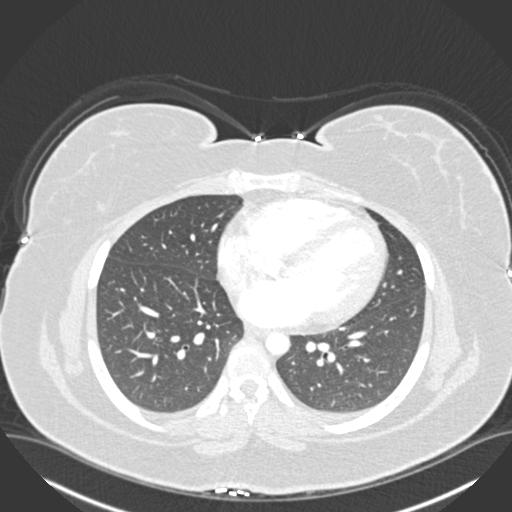
[im 150/288  soft-tissue]
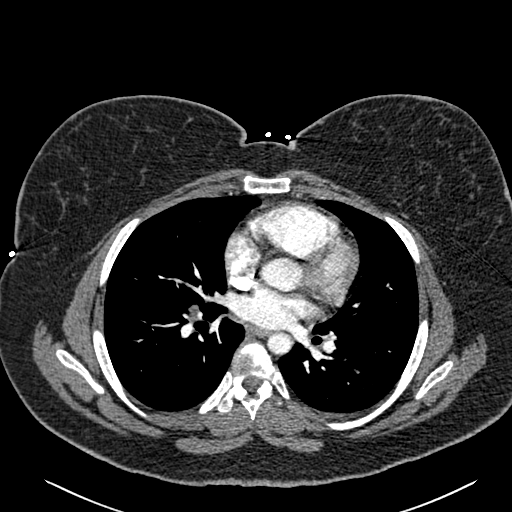
[im 163/288  lung]
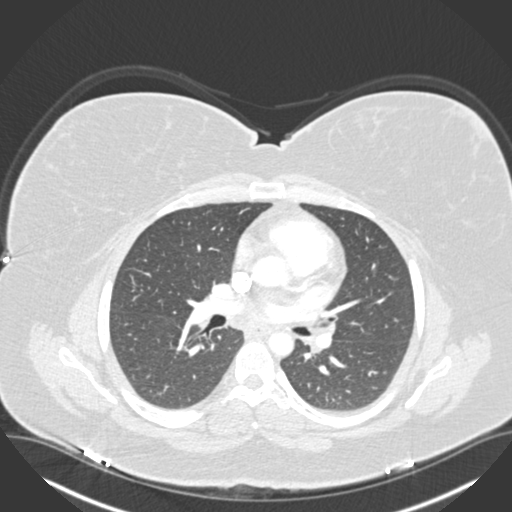
[im 175/288  soft-tissue]
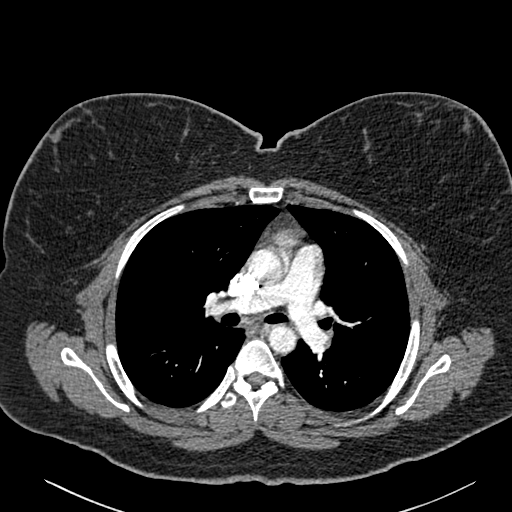
[im 200/288  lung]
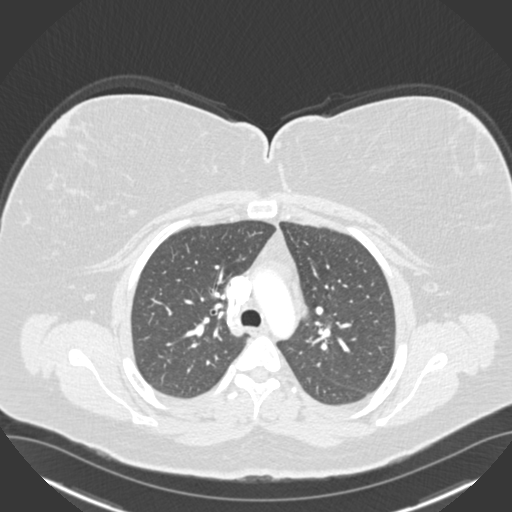
[im 213/288  soft-tissue]
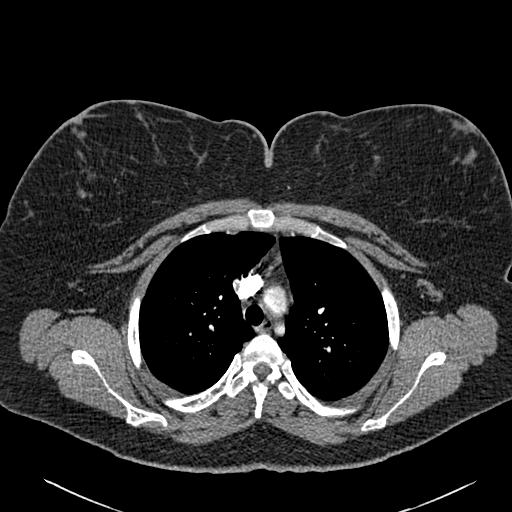
[im 238/288  lung]
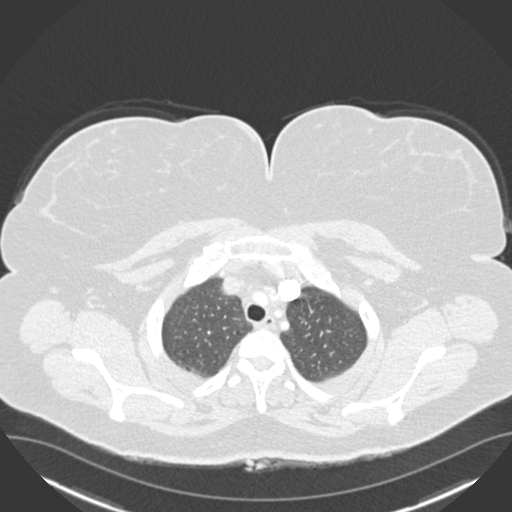
[im 250/288  soft-tissue]
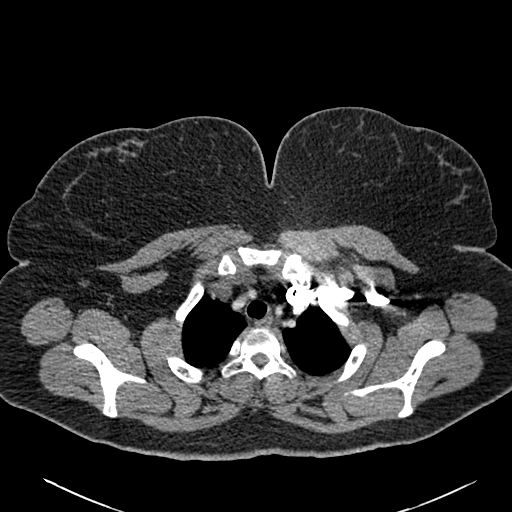
[im 275/288  lung]
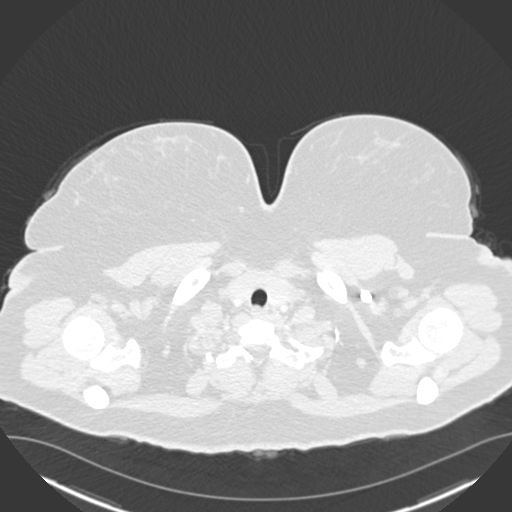

[Series 7: coronal mpr · coronal · 0.56mm/px · 3 of 88 slices shown]
[im 22/88  soft-tissue]
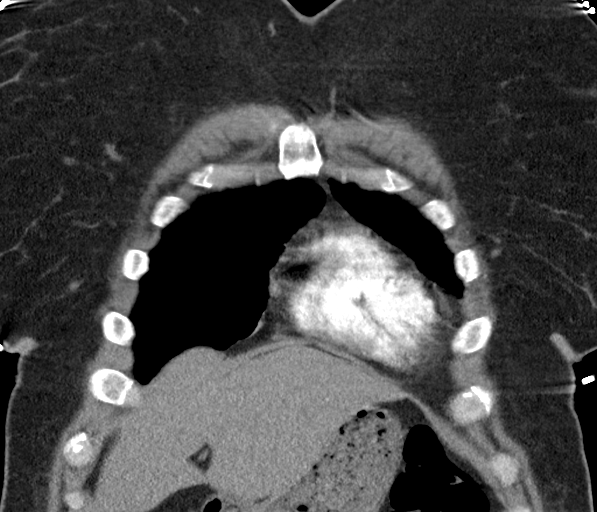
[im 44/88  soft-tissue]
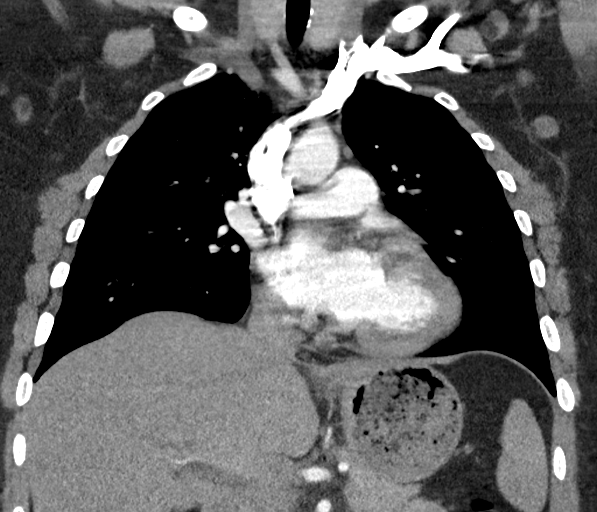
[im 66/88  soft-tissue]
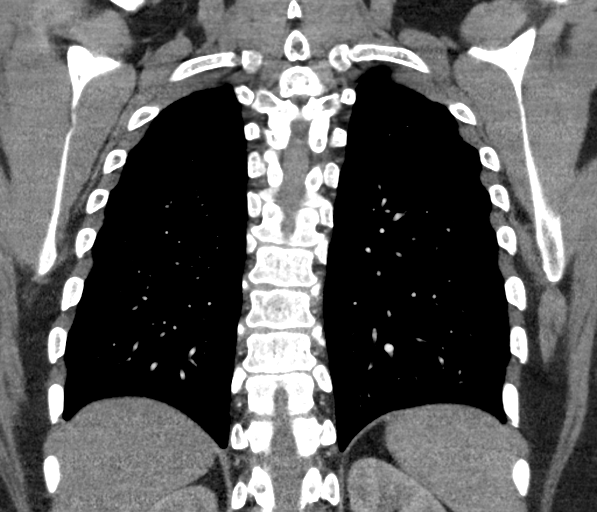

[18 of 46 positions shown; findings below may reference images not displayed]

FINDINGS: Cardiovascular: There is slightly suboptimal opacification of the
main pulmonary artery however no central or segmental pulmonary
embolism. The heart is normal in size. No pericardial effusion or
thickening. No evidence right heart strain. There is normal
three-vessel brachiocephalic anatomy without proximal stenosis. The
thoracic aorta is normal in appearance.

Mediastinum/Nodes: No hilar, mediastinal, or axillary adenopathy.
Thyroid gland, trachea, and esophagus demonstrate no significant
findings.

Lungs/Pleura: The lungs are clear. No pleural effusion or
pneumothorax. No airspace consolidation.

Upper Abdomen: No acute abnormalities present in the visualized
portions of the upper abdomen.

Musculoskeletal: No chest wall abnormality. No acute or significant
osseous findings.

Review of the MIP images confirms the above findings.
IMPRESSION: Slightly suboptimal opacification of the main pulmonary artery,
however no central or segmental pulmonary embolism.

No acute intrathoracic pathology to explain the patient's symptoms.

## 2019-10-02 IMAGING — US US EXTREM LOW VENOUS
1 series · 14 of 24 positions shown · non-contrast
Comparison: None.

CLINICAL DATA: BILATERAL lower extremity swelling, has [QN]

EXAM:
BILATERAL LOWER EXTREMITY VENOUS DOPPLER ULTRASOUND
TECHNIQUE: Gray-scale sonography with compression, as well as color and duplex
ultrasound, were performed to evaluate the deep venous system(s)
from the level of the common femoral vein through the popliteal and
proximal calf veins.

[Series 1: us venous img lower bilat (dvt) · portal-venous · 14 of 48 slices shown]
[im 1/48]
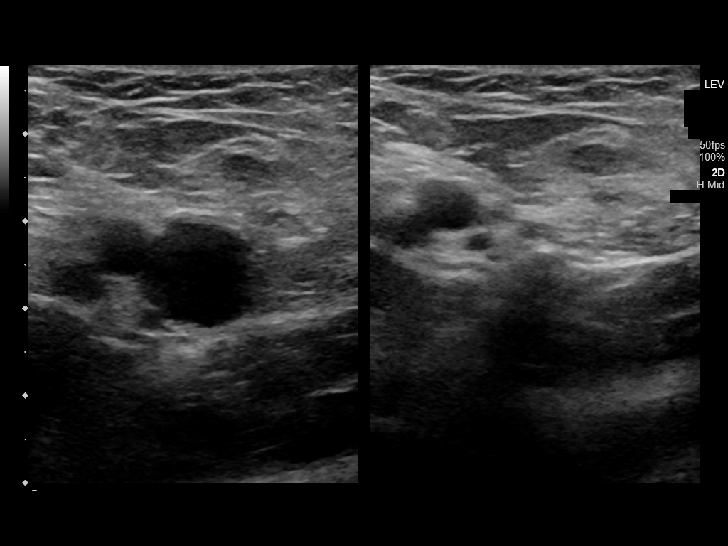
[im 5/48]
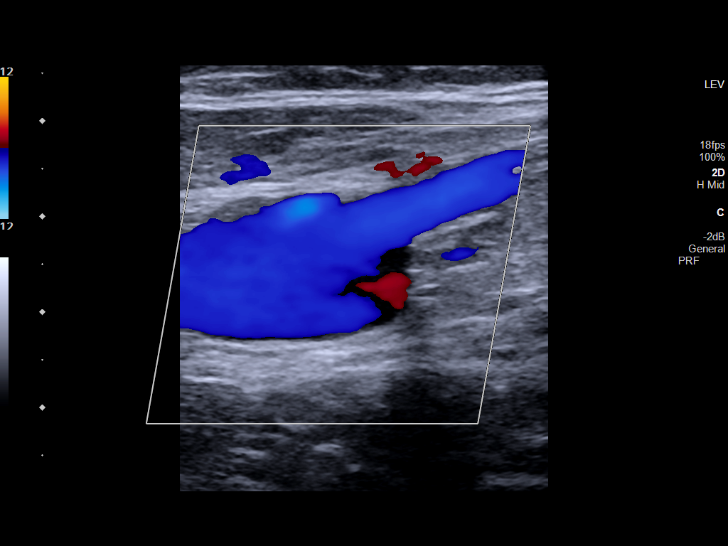
[im 9/48]
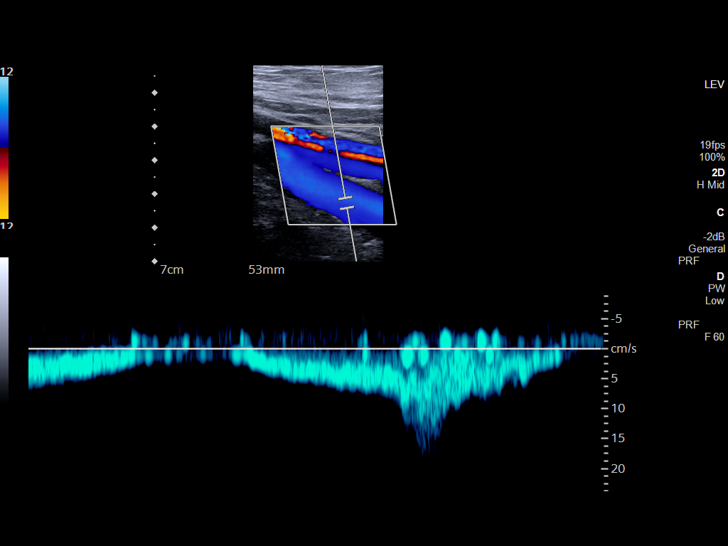
[im 13/48]
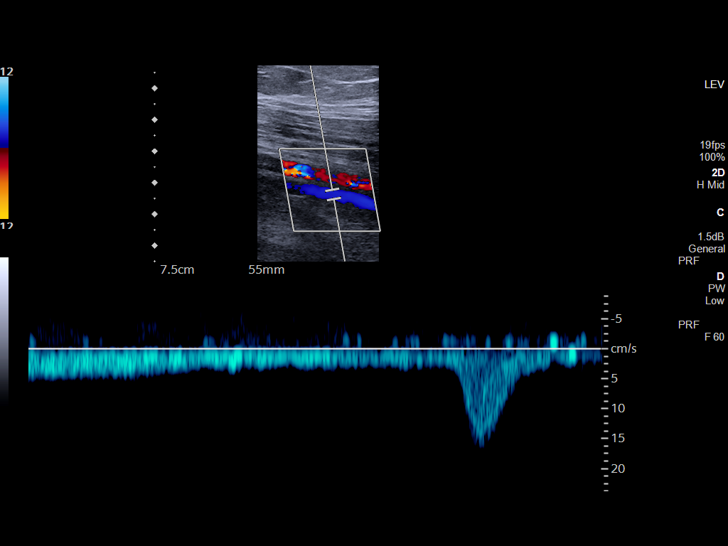
[im 15/48]
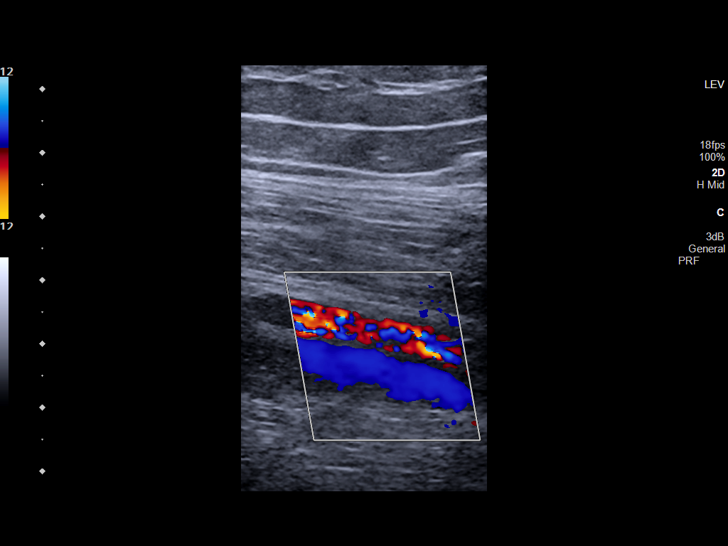
[im 19/48]
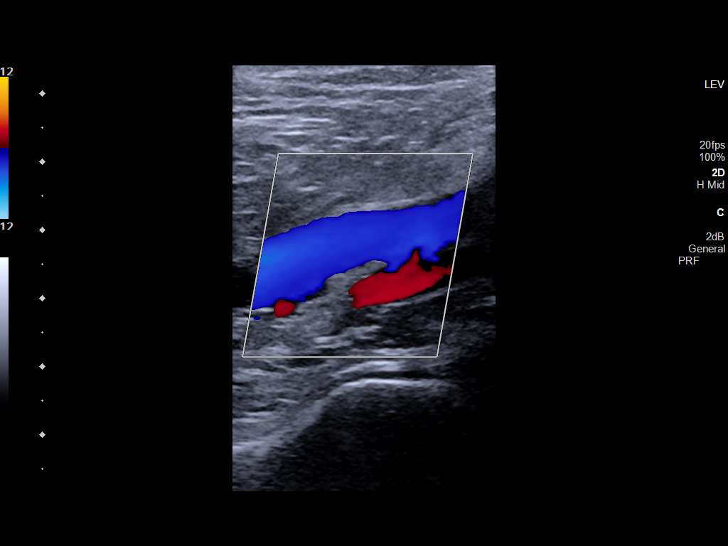
[im 23/48]
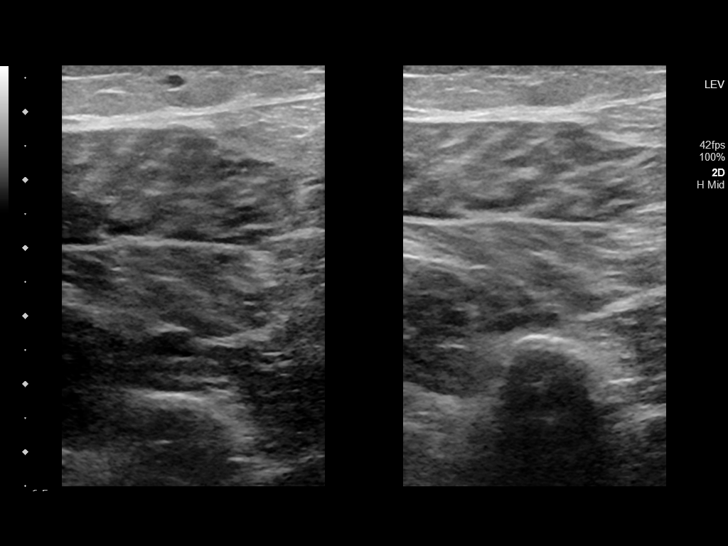
[im 25/48]
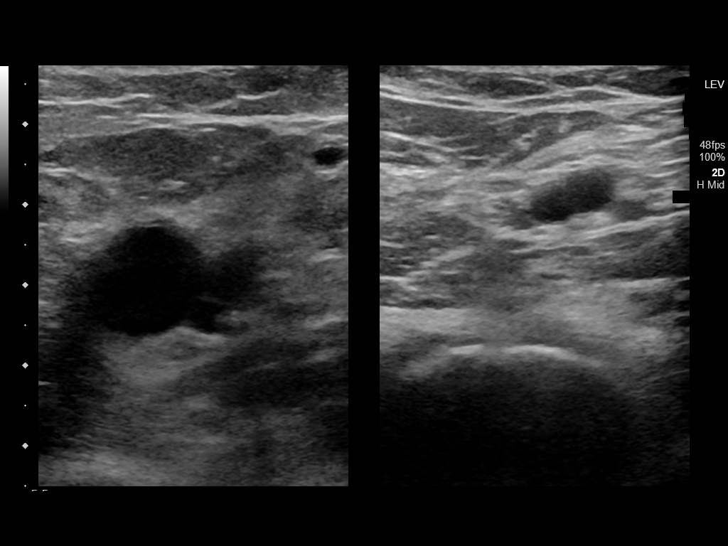
[im 29/48]
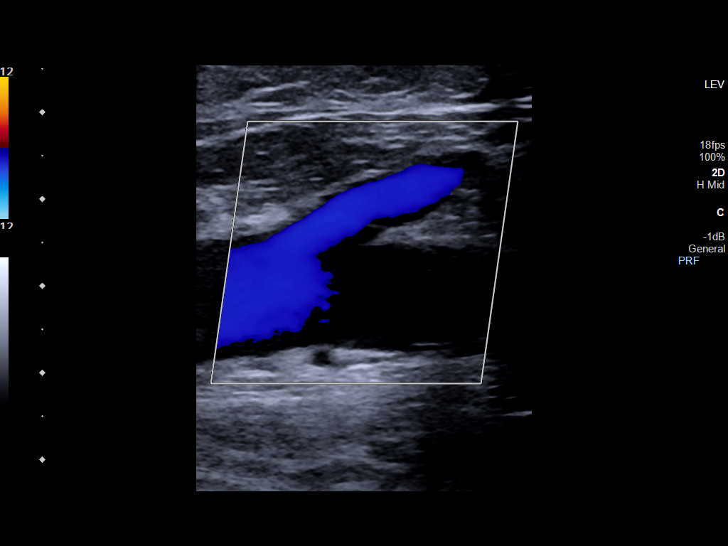
[im 33/48]
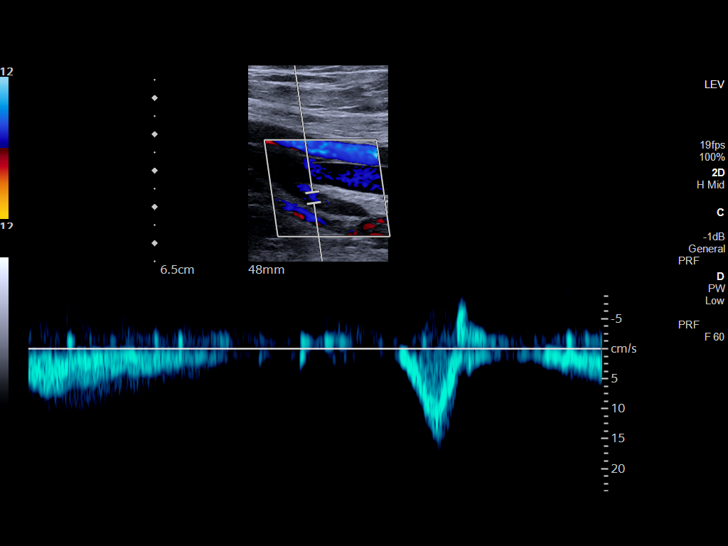
[im 37/48]
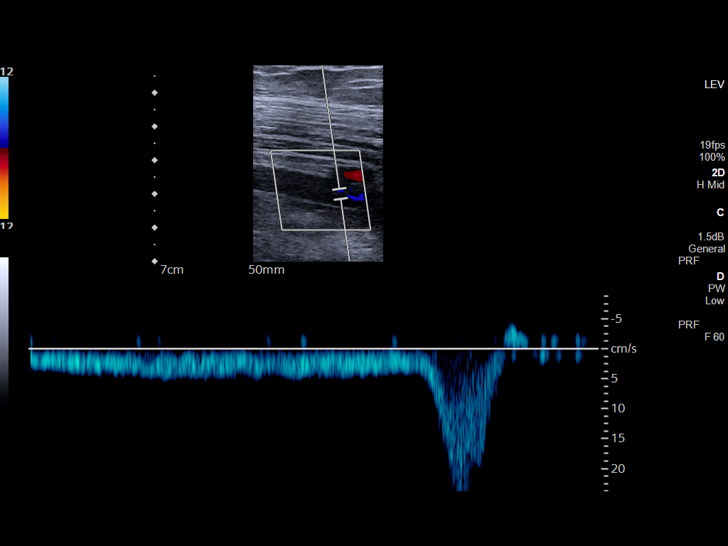
[im 39/48]
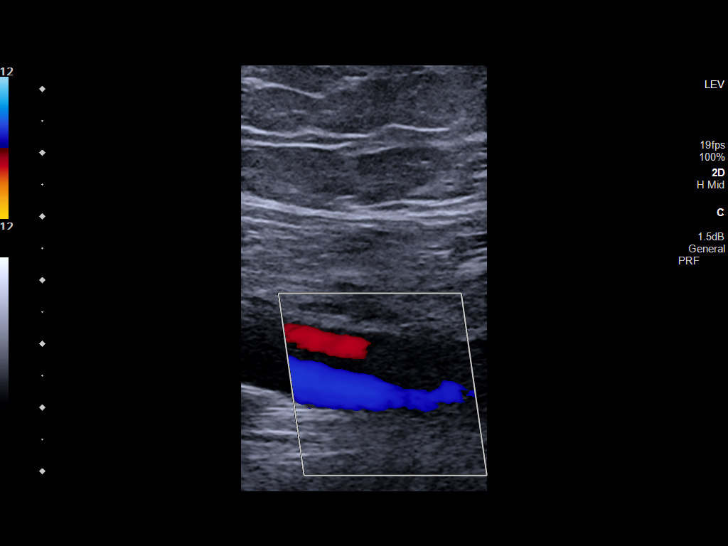
[im 43/48]
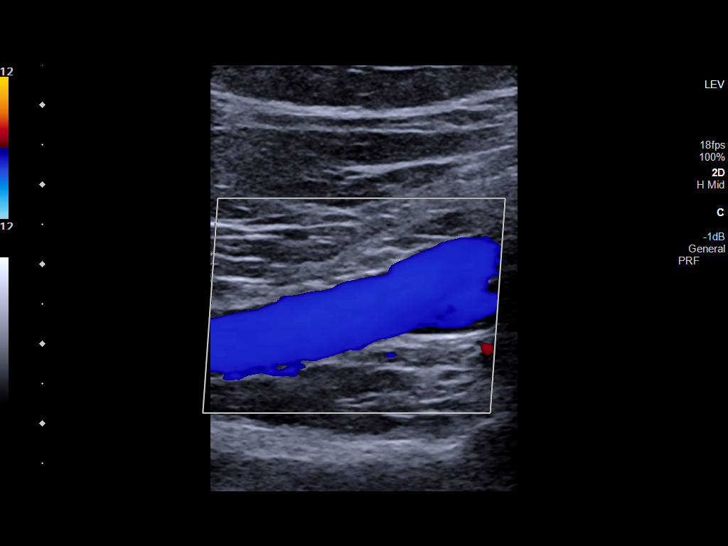
[im 48/48]
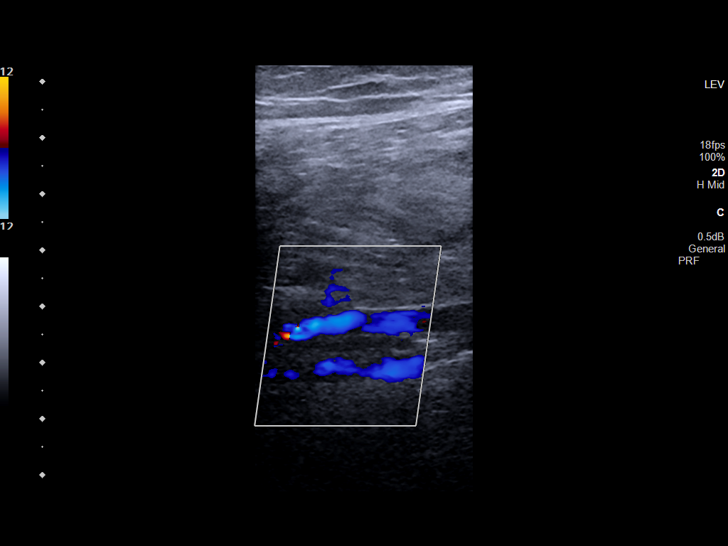

[14 of 24 positions shown; findings below may reference images not displayed]

FINDINGS: VENOUS

Normal compressibility of the BILATERAL common femoral, superficial
femoral, and popliteal veins, as well as the visualized calf veins.
Visualized portions of BILATERAL profunda femoral vein and great
saphenous vein unremarkable. No filling defects to suggest DVT on
grayscale or color Doppler imaging. Doppler waveforms show normal
direction of venous flow, normal respiratory plasticity and response
to augmentation.

OTHER

None.

Limitations: none
IMPRESSION: No evidence of deep venous thrombosis in either lower extremity.

## 2019-10-02 MED ORDER — HYDROCOD POLST-CPM POLST ER 10-8 MG/5ML PO SUER: 5.0000 mL | Freq: Every evening | ORAL | 0 refills | Status: DC | PRN

## 2019-10-02 MED ORDER — IOHEXOL 350 MG/ML SOLN
75.0000 mL | Freq: Once | INTRAVENOUS | Status: AC | PRN
  Administered 2019-10-02: 75 mL via INTRAVENOUS

## 2019-10-02 NOTE — Telephone Encounter (Signed)
Call pt back  I am so sorry what a difficult place to be I worry very much about her safety Does she have CP, palpitations, fever? How is her sob? Is she sob with activity or rest or both? Does she have sa02 at home?  Does she leg swelling?    with SOB and elevated D dimer ( this wasn't drawn in ED, only urgent care), please ask her if she would consider [facility].  With [facility], I dont know actual wait there however she would be high risk with covid, postive d dimer so I would expect that would order image asap.   Let me know what she says about all

## 2019-10-02 NOTE — Telephone Encounter (Signed)
access nurse called for patient she has been diagnosis  with covid on August the 9th and she is still having shortness of breath and chest pain  The patient was advised to contact her pcp for a car scan

## 2019-10-02 NOTE — Telephone Encounter (Signed)
Spoke with patient  8/9 diagnosed covid SOB with talking , exertion.  Not sob when sitting still.  SOB has improved since onset.  Dry cough which has worsened in regards to frequency.  Right foot and ankle swelling 2 weeks ago, unchanged. Doesn't improve or worsen with standing or walking. No calf swelling. Slight left foot or leg.  No ocp. NO  H/o dvt. Non smoker.   Continue to have moderate HA behind right eye. She also has nasal congestion, epigastric burning,left side of nose 'burning' .  No vision changes,  diarrhea, nausea, vomiting,fever, chills  completed zpak sent 09/14/19. Continues to use albuterol bid, however ran out of tussionex which had been helpful with relief  from dr Shirlee Latch  Didn't have monoclonal antibody fusion as missed window.  Doesn't have pulse ox at home CXR 09/29/19 no acute disease  Has 3 and 34 yo at home and cannot leave to go to ED for that length of time. 11 year son has covid  She doesn't feel symptoms warrant being seen in ED. She would just like to have to CT angio done. She cannot go to ed due to finding child care and will not go.   She understands limitations of telephone call and not being seen in person in ED. I agreed to send in tussionex and order CT, [place].   Advised to start vitamins per below and sent to mychart   Please start Mucinex DM to take at bedtime for cough.     Please begin Vit C 1000 mg daily   Vitamin D3 4000 IU daily   Zinc 100 g daily   Quercetin 250 mg -500 mg twice daily    Rest, hydrate very well, eat healthy protein food, Tylenol or Advil as directed .    Please go to Acute Care if symptoms worsen but are not an emergency. Office video visit again early next week.    Remain in self quarantine until at least 10 days since symptom onset AND 3 consecutive days fever free without antipyretics AND improvement in respiratory symptoms.    Utilize over the counter medications to treat symptoms.   Seek  treatment in the  ED if respiratory issues/distress develops or unrelieved chest pain. Or, if you become severely weak, dehydrated, confused.    Only leave home to seek medical care and must wear a mask in public. Limit contact with family members or caregivers in the home and to notify her family who she was with yesterday that they should quarantine for 14 days. Practice social distancing and to continue to use good preventative care measures such has frequent hand washing.      Your COVID-19 test was resulted as positive.    You should continue your self imposed quantarine until you can answer "yes" to ALL 3 of the conditions below:   1) Your symptoms (fever, cough, shortness of breath) started 7 or more days ago   2) Your body temperature  has been normal for at least 72 hours (WITHOUT the use of any tylenol, motrin or aleve) . Normal is < 100.4 Farenheit  3) your other flu  like symptoms symptoms are getting better.   YOUR FAMILY or other household contacts, however , even if they feel fine , will need to continue to quarantining themselves  (that means no contact with ANYONE outside of the house)  for 14 days STARTING from the end of your initial 7 day period . (why? because they have been theoretically  exposed to you during the entire 7 days of your illness, and they can sheD the virus without symptoms for that period of time ).         10 Things You Can Do to Manage Your COVID-19 Symptoms at Home If you have possible or confirmed COVID-19: 1. Stay home from work and school. And stay away from other public places. If you must go out, avoid using any kind of public transportation, ridesharing, or taxis. 2. Monitor your symptoms carefully. If your symptoms get worse, call your healthcare provider immediately. 3. Get rest and stay hydrated. 4. If you have a medical appointment, call the healthcare provider ahead of time and tell them that you have or may have COVID-19. 5. For medical emergencies, call 911  and notify the dispatch personnel that you have or may have COVID-19. 6. Cover your cough and sneezes with a tissue or use the inside of your elbow. 7. Wash your hands often with soap and water for at least 20 seconds or clean your hands with an alcohol-based hand sanitizer that contains at least 60% alcohol. 8. As much as possible, stay in a specific room and away from other people in your home. Also, you should use a separate bathroom, if available. If you need to be around other people in or outside of the home, wear a mask. 9. Avoid sharing personal items with other people in your household, like dishes, towels, and bedding. 10. Clean all surfaces that are touched often, like counters, tabletops, and doorknobs. Use household cleaning sprays or wipes according to the label instructions. [email or web address] 08/03/2018

## 2019-10-02 NOTE — Telephone Encounter (Signed)
I spoke with patient & she stated that she was home alone with her two kids due to covid. She was positive as well as her son & all family members have covid or are too afraid to be around her as well as her children. She works for [employer] when she spoke to [employer] they told her ED wait times are 20hrs. She did not feel comfortable leaving her kids that long. She said that when she saw UC they advised to f/u with PCP to order CT bc he d dimer at been elevated twice now. I stressed to patient the importance of being seen due to concern for PE. Patient stated that she would try to go to [facility] bc she was told by a family member that wait times were not as long there. Patient feels stuck & unsure of what to do.

## 2019-10-02 NOTE — Telephone Encounter (Signed)
I spoke with patient & she stated that her chest just mainly feels congested. She has no fever & no wheezing. She is only SOB upon exertion & when talking. She stated that her feet & ankles have been swelling randomly for the last two weeks, but not her legs. She was not planning at this time to go to [facility] due to her children & still fearing ED wait times.

## 2019-10-02 NOTE — Telephone Encounter (Signed)
See telephone note.

## 2019-10-02 NOTE — Telephone Encounter (Signed)
Pt is Covid positive and she went to Urgent Care on Friday and was advised that she needs a CT Scan done. Pt doesn't want to sit at ED to get CT of chest done. She would like for Dr. French Ana to order it. Pt then tells me she is still having shortness of breath and her heart rate is up. With the shortness of breath I transferred her to access nurse.

## 2019-10-02 NOTE — Telephone Encounter (Signed)
Call pt Her d dimer was positive from urgent care which can mean a blood clot. I do not feel comfortable ordered a CT chest without her being examined by a provider with ongoing shortness of breath Please speak with patient as perhaps she wasn't aware of urgent care providers concern of PE, which is what I can see from note on 09/29/19  I know she doesn't want to be in the ED. I will call in advance and let nurse triage know and they should prioritize sob and concern for PE.

## 2019-10-02 NOTE — Telephone Encounter (Signed)
Would you be able to triage the patient Sandra Castaneda?

## 2019-10-02 NOTE — Telephone Encounter (Signed)
Duplicate message. Patient was seen at ED/UC on the 27AUG for these sx. Patient just wants CT scan per ED/UC recommendation for PCP to schedule. Please advise.

## 2019-10-03 ENCOUNTER — Telehealth: Payer: Self-pay | Admitting: Family

## 2019-10-03 DIAGNOSIS — R519 Headache, unspecified: Secondary | ICD-10-CM

## 2019-10-03 MED ORDER — PROPRANOLOL HCL 10 MG PO TABS: 10.0000 mg | ORAL_TABLET | Freq: Two times a day (BID) | ORAL | 1 refills | Status: DC

## 2019-10-03 NOTE — Telephone Encounter (Signed)
Sarah, call pt My telephone notes are below and you dont have to read those to patient  Start propranolol 10 mg BID for headache prevention; I have sent in  Decrease use of tylenol and aleve as worried causing medication overuse HA.  Plenty of water Try to get more sleep Advise to call with new symptoms surrounding HA or covid, particularly SOB, cough Please make a follow up with me or mclean in the next week or two.      My notes:      Spoke with radiologist dr Desmond Lope in regards to opacification on CTA, she explained that contrast didn't light up as well likely due to body habitus. She confirmed NO PE.   Called pt Advised of no acute findings CTA and BL [place].  She was home and denied sob.  Most bothersome symptom for her his right sided HA. 'feels like a migraine.' She h/o of migraine prior to birth of oldest son in which HA was unilateral. However since his birth 54 years, she has not suffered from ha's.  This HA has been on present for 2 weeks since diagnosis of covid. Usually present in the afternoons. Endorses photosensitivity. No aura, vision changes, fever, N, v. Drinks sweet tea. Not sleeping well since covid and endorses stress which she thinks may be trigger.  Prior she had  Been on rescue medication however doesn't recall name. No h/o htn. She is taking tylenol and aleve daily for HA w/o relief.   Able to talk and laugh without coughing over phone with me. She was not labored in the her speech BP Readings from Last 3 Encounters:  09/29/19 (!) 121/95  09/13/19 140/75  09/01/19 116/78    Plan Discussed h/o migraine and if virus has created environment for HA's to recur. Advised plenty of water, less caffeine and reduce tylenol and nsaids due to concern for medication overuse HA.  Plan to start propranolol for HA ppx.  Will schedule pt for f/u here in the next 1-2 weeks She will call with any new concerns of worsening , new symptoms surrounding HA or covid, particularly  SOB, cough

## 2019-10-03 NOTE — Telephone Encounter (Signed)
See phone note

## 2019-10-03 NOTE — Telephone Encounter (Signed)
I spoke with patient to advise on below & that propanolol. She  was also advised to decrease use of aleve and tylenol. Patient has f/u with Dr. French Ana 9/17. She will call back if any worsening sx.

## 2019-10-06 ENCOUNTER — Encounter: Payer: 59 | Admitting: Plastic Surgery

## 2019-10-10 ENCOUNTER — Telehealth: Payer: Self-pay | Admitting: Internal Medicine

## 2019-10-10 NOTE — Telephone Encounter (Signed)
Please advise Previously discussed with Rennie Plowman 10/03/19.

## 2019-10-10 NOTE — Telephone Encounter (Signed)
Pt called wanting to get a return to work note from her having Covid  She is still exteremly tired and is still having headaches

## 2019-10-16 ENCOUNTER — Encounter: Payer: Self-pay | Admitting: Internal Medicine

## 2019-10-16 NOTE — Telephone Encounter (Signed)
Note in my chart  Propranolol could cause fatigue so if not helping h/a rec stop  Will refer to Neurology urgently as well for h/a post covid 09/11/19 +

## 2019-10-16 NOTE — Addendum Note (Signed)
Addended by: Quentin Ore on: 10/16/2019 11:40 PM   Modules accepted: Orders

## 2019-10-17 DIAGNOSIS — Z03818 Encounter for observation for suspected exposure to other biological agents ruled out: Secondary | ICD-10-CM | POA: Diagnosis not present

## 2019-10-17 DIAGNOSIS — R519 Headache, unspecified: Secondary | ICD-10-CM | POA: Diagnosis not present

## 2019-10-17 DIAGNOSIS — Z20822 Contact with and (suspected) exposure to covid-19: Secondary | ICD-10-CM | POA: Diagnosis not present

## 2019-10-17 NOTE — Telephone Encounter (Signed)
Patient informed and verbalized understanding.  States that the medication has not been helping. She will stop taking this. Patient has an appointment with Neurology.   Patient states that her job has sent over leave of absence paperwork 10/10/19. Have you received this?

## 2019-10-19 ENCOUNTER — Other Ambulatory Visit: Payer: Self-pay

## 2019-10-19 ENCOUNTER — Telehealth: Payer: Self-pay | Admitting: Neurology

## 2019-10-19 ENCOUNTER — Encounter: Payer: Self-pay | Admitting: Neurology

## 2019-10-19 ENCOUNTER — Ambulatory Visit (INDEPENDENT_AMBULATORY_CARE_PROVIDER_SITE_OTHER): Payer: 59 | Admitting: Neurology

## 2019-10-19 VITALS — BP 130/58 | HR 72 | Ht 71.0 in | Wt 257.0 lb

## 2019-10-19 DIAGNOSIS — U071 COVID-19: Secondary | ICD-10-CM

## 2019-10-19 DIAGNOSIS — R51 Headache with orthostatic component, not elsewhere classified: Secondary | ICD-10-CM

## 2019-10-19 DIAGNOSIS — Z0279 Encounter for issue of other medical certificate: Secondary | ICD-10-CM

## 2019-10-19 DIAGNOSIS — R29898 Other symptoms and signs involving the musculoskeletal system: Secondary | ICD-10-CM

## 2019-10-19 DIAGNOSIS — R519 Headache, unspecified: Secondary | ICD-10-CM | POA: Diagnosis not present

## 2019-10-19 DIAGNOSIS — H539 Unspecified visual disturbance: Secondary | ICD-10-CM

## 2019-10-19 DIAGNOSIS — G43011 Migraine without aura, intractable, with status migrainosus: Secondary | ICD-10-CM

## 2019-10-19 DIAGNOSIS — M5382 Other specified dorsopathies, cervical region: Secondary | ICD-10-CM

## 2019-10-19 DIAGNOSIS — F458 Other somatoform disorders: Secondary | ICD-10-CM

## 2019-10-19 DIAGNOSIS — R200 Anesthesia of skin: Secondary | ICD-10-CM

## 2019-10-19 DIAGNOSIS — R29818 Other symptoms and signs involving the nervous system: Secondary | ICD-10-CM

## 2019-10-19 DIAGNOSIS — M7918 Myalgia, other site: Secondary | ICD-10-CM | POA: Diagnosis not present

## 2019-10-19 DIAGNOSIS — G08 Intracranial and intraspinal phlebitis and thrombophlebitis: Secondary | ICD-10-CM

## 2019-10-19 DIAGNOSIS — M629 Disorder of muscle, unspecified: Secondary | ICD-10-CM

## 2019-10-19 MED ORDER — AMITRIPTYLINE HCL 25 MG PO TABS: 25.0000 mg | ORAL_TABLET | Freq: Every day | ORAL | 3 refills | Status: DC

## 2019-10-19 NOTE — Patient Instructions (Addendum)
MRI brain and MRV of the brain Nerve blocks today Start Amitriptyline 30 ,minmiutes to one hour before bed As soon as the imaging comes back if negative will proceed to help more but should make sure the brain is ok  Amitriptyline tablets What is this medicine? AMITRIPTYLINE (a mee TRIP ti leen) is used to treat depression. This medicine may be used for other purposes; ask your health care provider or pharmacist if you have questions. COMMON BRAND NAME(S): Elavil, Vanatrip What should I tell my health care provider before I take this medicine? They need to know if you have any of these conditions:  an alcohol problem  asthma, difficulty breathing  bipolar disorder or schizophrenia  difficulty passing urine, prostate trouble  glaucoma  heart disease or previous heart attack  liver disease  over active thyroid  seizures  thoughts or plans of suicide, a previous suicide attempt, or family history of suicide attempt  an unusual or allergic reaction to amitriptyline, other medicines, foods, dyes, or preservatives  pregnant or trying to get pregnant  breast-feeding How should I use this medicine? Take this medicine by mouth with a drink of water. Follow the directions on the prescription label. You can take the tablets with or without food. Take your medicine at regular intervals. Do not take it more often than directed. Do not stop taking this medicine suddenly except upon the advice of your doctor. Stopping this medicine too quickly may cause serious side effects or your condition may worsen. A special MedGuide will be given to you by the pharmacist with each prescription and refill. Be sure to read this information carefully each time. Talk to your pediatrician regarding the use of this medicine in children. Special care may be needed. Overdosage: If you think you have taken too much of this medicine contact a poison control center or emergency room at once. NOTE: This  medicine is only for you. Do not share this medicine with others. What if I miss a dose? If you miss a dose, take it as soon as you can. If it is almost time for your next dose, take only that dose. Do not take double or extra doses. What may interact with this medicine? Do not take this medicine with any of the following medications:  arsenic trioxide  certain medicines used to regulate abnormal heartbeat or to treat other heart conditions  cisapride  droperidol  halofantrine  linezolid  MAOIs like Carbex, Eldepryl, Marplan, Nardil, and Parnate  methylene blue  other medicines for mental depression  phenothiazines like perphenazine, thioridazine and chlorpromazine  pimozide  probucol  procarbazine  sparfloxacin  St. John's Wort This medicine may also interact with the following medications:  atropine and related drugs like hyoscyamine, scopolamine, tolterodine and others  barbiturate medicines for inducing sleep or treating seizures, like phenobarbital  cimetidine  disulfiram  ethchlorvynol  thyroid hormones such as levothyroxine  ziprasidone This list may not describe all possible interactions. Give your health care provider a list of all the medicines, herbs, non-prescription drugs, or dietary supplements you use. Also tell them if you smoke, drink alcohol, or use illegal drugs. Some items may interact with your medicine. What should I watch for while using this medicine? Tell your doctor if your symptoms do not get better or if they get worse. Visit your doctor or health care professional for regular checks on your progress. Because it may take several weeks to see the full effects of this medicine, it is important to  continue your treatment as prescribed by your doctor. Patients and their families should watch out for new or worsening thoughts of suicide or depression. Also watch out for sudden changes in feelings such as feeling anxious, agitated, panicky,  irritable, hostile, aggressive, impulsive, severely restless, overly excited and hyperactive, or not being able to sleep. If this happens, especially at the beginning of treatment or after a change in dose, call your health care professional. Bonita Quin may get drowsy or dizzy. Do not drive, use machinery, or do anything that needs mental alertness until you know how this medicine affects you. Do not stand or sit up quickly, especially if you are an older patient. This reduces the risk of dizzy or fainting spells. Alcohol may interfere with the effect of this medicine. Avoid alcoholic drinks. Do not treat yourself for coughs, colds, or allergies without asking your doctor or health care professional for advice. Some ingredients can increase possible side effects. Your mouth may get dry. Chewing sugarless gum or sucking hard candy, and drinking plenty of water will help. Contact your doctor if the problem does not go away or is severe. This medicine may cause dry eyes and blurred vision. If you wear contact lenses you may feel some discomfort. Lubricating drops may help. See your eye doctor if the problem does not go away or is severe. This medicine can cause constipation. Try to have a bowel movement at least every 2 to 3 days. If you do not have a bowel movement for 3 days, call your doctor or health care professional. This medicine can make you more sensitive to the sun. Keep out of the sun. If you cannot avoid being in the sun, wear protective clothing and use sunscreen. Do not use sun lamps or tanning beds/booths. What side effects may I notice from receiving this medicine? Side effects that you should report to your doctor or health care professional as soon as possible:  allergic reactions like skin rash, itching or hives, swelling of the face, lips, or tongue  anxious  breathing problems  changes in vision  confusion  elevated mood, decreased need for sleep, racing thoughts, impulsive  behavior  eye pain  fast, irregular heartbeat  feeling faint or lightheaded, falls  feeling agitated, angry, or irritable  fever with increased sweating  hallucination, loss of contact with reality  seizures  stiff muscles  suicidal thoughts or other mood changes  tingling, pain, or numbness in the feet or hands  trouble passing urine or change in the amount of urine  trouble sleeping  unusually weak or tired  vomiting  yellowing of the eyes or skin Side effects that usually do not require medical attention (report to your doctor or health care professional if they continue or are bothersome):  change in sex drive or performance  change in appetite or weight  constipation  dizziness  dry mouth  nausea  tired  tremors  upset stomach This list may not describe all possible side effects. Call your doctor for medical advice about side effects. You may report side effects to FDA at 1-800-FDA-1088. Where should I keep my medicine? Keep out of the reach of children. Store at room temperature between 20 and 25 degrees C (68 and 77 degrees F). Throw away any unused medicine after the expiration date. NOTE: This sheet is a summary. It may not cover all possible information. If you have questions about this medicine, talk to your doctor, pharmacist, or health care provider.  2020 Elsevier/Gold Standard (  2018-01-11 13:04:32)  

## 2019-10-19 NOTE — Telephone Encounter (Signed)
no to the covid questions MR Brain w/wo contrast & MRV Head wo contrast Dr. Valentino [facility]on [facility] Auth: NPR Ref # [id number]. Patient is scheduled at [facility] for 10/24/19.

## 2019-10-19 NOTE — Progress Notes (Signed)
[facility]    Provider:  Dr Jaynee Eagles Requesting Provider: McLean-Scocuzza, Olivia Mackie * Primary Care Provider:  McLean-Scocuzza, Nino Glow, MD  CC:  Headache, migraine, intractable since covid infection  HPI:  Sandra Castaneda is a 34 y.o. female here as requested by McLean-Scocuzza, Olivia Mackie * for chronic migraines. PMHx chronic migraines, chronic low back pain, anxiety and depression, chronic pain.   Migraines started as a teenager at 48 but they improved at the age of [age], every now and again she would get a migraine but not bad. When she had Covid 8/10, she started feeling pressure behind the right eye and triggered the migraine and she has had a headache since august 10th has never gone away. It is on the left behind the eye, then moves to the right eye, pulsating and throbbing, sensitivity to light, she feels her sinuses are burning on the left, since then burning in the left maxillary sinus is continuous. A lot of maxillary sinus problems. They have tried cumbalta and propranolol. Had nausea with migraines in the past, no appetite, pressure sitting on the forehead mostly on the left behind the eye and maxillary sinus. They have not tried treating, no drainage, but there is fullnes. She has a lot of light sensitivity. She has neck stiffness, tightness, neck is very tense, She can touch her chin to her chest. She is having blurry vision.  Trouble sleeping and initiating sleep. Waxes and wanes but getting bad. Headaches are positional. D-Dimer was elevated. She can wake with worse headaches, worse positionally while standing. Numbness right leg. D-Dimer elevated 09/29/2019. No other focal neurologic deficits, associated symptoms, inciting events or modifiable factors.  Medications tried that can be used in migraine management include: Cymbalta, propranolol, Tylenol, Benadryl, hydrocodone, ibuprofen, Toradol injections, Zofran injections and oral, oxycodone,  Reviewed notes, labs and  imaging from outside physicians, which showed  MRI 09/2018 normal, personally reviewed images  D-dimer 549 09/29/2019 tsh 02/2019 nml  Review of Systems: Patient complains of symptoms per HPI as well as the following symptoms: headache, nausea, light sensitivity. Pertinent negatives and positives per HPI. All others negative.   Social History   Socioeconomic History  . Marital status: Single    Spouse name: Not on file  . Number of children: 2  . Years of education: Not on file  . Highest education level: Not on file  Occupational History  . Not on file  Tobacco Use  . Smoking status: Former Smoker    Packs/day: 0.25    Quit date: 09/2019    Years since quitting: 0.1  . Smokeless tobacco: Never Used  Substance and Sexual Activity  . Alcohol use: No  . Drug use: No  . Sexual activity: Yes    Partners: Male    Birth control/protection: Surgical  Other Topics Concern  . Not on file  Social History Narrative   DPR brother Mackena Plummer [phone]   85 y.o [place] as of 01/13/19   2 y.o daughter as of 01/13/2019    Single    4 brothers    Smoker as of 01/13/19    Works [employer]       Lives at home with her children   Right handed   Caffeine: maybe 2 cups/day   Social Determinants of Health   Financial Resource Strain:   . Difficulty of Paying Living Expenses: Not on file  Food Insecurity:   . Worried About Charity fundraiser in the Last Year: Not  on file  . Ran Out of Food in the Last Year: Not on file  Transportation Needs:   . Lack of Transportation (Medical): Not on file  . Lack of Transportation (Non-Medical): Not on file  Physical Activity:   . Days of Exercise per Week: Not on file  . Minutes of Exercise per Session: Not on file  Stress:   . Feeling of Stress : Not on file  Social Connections:   . Frequency of Communication with Friends and Family: Not on file  . Frequency of Social Gatherings with Friends and Family: Not on file  .  Attends Religious Services: Not on file  . Active Member of Clubs or Organizations: Not on file  . Attends Archivist Meetings: Not on file  . Marital Status: Not on file  Intimate Partner Violence:   . Fear of Current or Ex-Partner: Not on file  . Emotionally Abused: Not on file  . Physically Abused: Not on file  . Sexually Abused: Not on file    Family History  Problem Relation Age of Onset  . Brain cancer Mother   . Hypertension Mother   . Ovarian cancer Mother 28  . Lung cancer Mother   . Cancer Mother        ovarian to lung to brain  . Headache Mother   . Diabetes Mellitus I Father   . Pancreatic cancer Father 20  . Cancer Father        stomach cancer  . Diabetes Father   . Hypertension Brother   . Breast cancer Maternal Aunt 44  . Diabetes Mellitus II Paternal Aunt   . Diabetes Mellitus II Paternal Grandmother   . Breast cancer Maternal Aunt 87  . Anxiety disorder Brother     Past Medical History:  Diagnosis Date  . Anemia   . Anxiety and depression    lexapro 10 mg no help, zoloft like zombie  . BRCA negative    per pt report, done through [facility]  . Chronic low back pain    11/05/2018 MRI L spine 3. 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 COVID-19    09/11/19  . COVID-19    09/11/19  . Heavy menses   . Increased risk of breast cancer    IBIS=25.3%  . Large breasts   . Ovarian cyst 2007    Patient Active Problem List   Diagnosis Date Noted  . Skin excoriation 08/03/2019  . Tobacco abuse 04/19/2019  . Hyperlipidemia 04/19/2019  . Anxiety and depression 01/13/2019  . Vitamin D deficiency 01/13/2019  . Chronic mid back pain 01/13/2019  . Chronic low back pain   . Symptomatic mammary hypertrophy   . Anemia   . Dysmenorrhea 07/22/2016  . Menorrhagia with irregular cycle 07/22/2016  . Annual physical exam 02/04/2016    Past Surgical History:  Procedure Laterality Date  . CESAREAN SECTION  2008   FTP  . CESAREAN  SECTION WITH BILATERAL TUBAL LIGATION N/A 02/04/2016   Procedure: CESAREAN SECTION WITH BILATERAL TUBAL LIGATION;  Surgeon: Will Bonnet, MD;  Location: [facility] ORS;  Service: Obstetrics;  Laterality: N/A;  . LAPAROTOMY  2007   ruptured ovarian cyst Dr. Ammie Dalton     Current Outpatient Medications  Medication Sig Dispense Refill  . acetaminophen (TYLENOL) 500 MG tablet Take 1 tablet (500 mg total) by mouth every 6 (six) hours as needed. For use AFTER surgery 30 tablet 0  . albuterol (VENTOLIN HFA) 108 (90  Base) MCG/ACT inhaler Inhale 1-2 puffs into the lungs every 4 (four) hours as needed for wheezing or shortness of breath. 18 g 0  . Cholecalciferol 1.25 MG (50000 UT) capsule Take 1 capsule (50,000 Units total) by mouth once a week. 13 capsule 2  . amitriptyline (ELAVIL) 25 MG tablet Take 1 tablet (25 mg total) by mouth at bedtime. 30 tablet 3   No current facility-administered medications for this visit.    Allergies as of 10/19/2019 - Review Complete 10/19/2019  Allergen Reaction Noted  . Chantix [varenicline]  01/13/2019  . Lexapro [escitalopram]  01/13/2019  . Zoloft [sertraline]  01/13/2019    Vitals: BP (!) 130/58 (BP Location: Left Arm, Patient Position: Sitting)   Pulse 72   Ht 5' 11" (1.803 m)   Wt 257 lb (116.6 kg)   LMP 10/02/2019   BMI 35.84 kg/m  Last Weight:  Wt Readings from Last 1 Encounters:  10/19/19 257 lb (116.6 kg)   Last Height:   Ht Readings from Last 1 Encounters:  10/19/19 5' 11" (1.803 m)     Physical exam: Exam: Gen: NAD, conversant, well nourised, obese, well groomed                     CV: RRR, no MRG. No Carotid Bruits. No peripheral edema, warm, nontender Eyes: Conjunctivae clear without exudates or hemorrhage  Neuro: Detailed Neurologic Exam  Speech:    Speech is normal; fluent and spontaneous with normal comprehension.  Cognition:    The patient is oriented to person, place, and time;     recent and remote memory intact;      language fluent;     normal attention, concentration,     fund of knowledge Cranial Nerves:    The pupils are equal, round, and reactive to light. The fundi are flat. Visual fields are full to finger confrontation. Extraocular movements are intact. Trigeminal sensation is intact and the muscles of mastication are normal. The face is symmetric. The palate elevates in the midline. Hearing intact. Voice is normal. Shoulder shrug is normal. The tongue has normal motion without fasciculations.   Coordination:    Normal finger to nose  Gait:    Normal native gait  Motor Observation:    No asymmetry, no atrophy, and no involuntary movements noted. Tone:    Normal muscle tone.    Posture:    Posture is normal. normal erect    Strength: right leg prox weakness. Otherwise strength is V/V in the upper and lower limbs.      Sensation: intact to LT     Reflex Exam:  DTR's:    Deep tendon reflexes in the upper and lower extremities are symmetrical bilaterally.   Toes:    The toes are downgoing bilaterally.   Clonus:    Clonus is absent.    Assessment/Plan:  33 year old with intractable headache since having Covid, elevated d-dimer, focal neuro deficit need to evaluate thoroughly as below:  MRI brain and MRV head due to concerning symptoms of morning headaches, positional headaches,vision changes, fical weakness (right leg), paresthesias, elevated d-dimer  to look for meningitis, csk leak, cerebral venous thrombosis, space occupying mass, chiari or intracranial hypertension (pseudotumor) after covid.   MRI  And MRV of the brain as above Nerve blocks today in the office Start Amitriptyline for headache and sleep After MRI consider sphenopalatine nerve blocks or other intervention Go home and rest today email me later to let me know  how the nerve blocks worked If the amitriptyline is not tolerated or not helping then will try flexeril at night  She has myofascial component in the  cervical spine and masseters: Send to [facility] and dry needling bruxism, cervical myofascial pain and migraines  Discussed: for worsening go to the ED Discussed: To prevent or relieve headaches, try the following: Cool Compress. Lie down and place a cool compress on your head.  Avoid headache triggers. If certain foods or odors seem to have triggered your migraines in the past, avoid them. A headache diary might help you identify triggers.  Include physical activity in your daily routine. Try a daily walk or other moderate aerobic exercise.  Manage stress. Find healthy ways to cope with the stressors, such as delegating tasks on your to-do list.  Practice relaxation techniques. Try deep breathing, yoga, massage and visualization.  Eat regularly. Eating regularly scheduled meals and maintaining a healthy diet might help prevent headaches. Also, drink plenty of fluids.  Follow a regular sleep schedule. Sleep deprivation might contribute to headaches Consider biofeedback. With this mind-body technique, you learn to control certain bodily functions -- such as muscle tension, heart rate and blood pressure -- to prevent headaches or reduce headache pain.    Proceed to emergency room if you experience new or worsening symptoms or symptoms do not resolve, if you have new neurologic symptoms or if headache is severe, or for any concerning symptom.   Provided education and documentation from American headache Society toolbox including articles on: chronic migraine medication overuse headache, chronic migraines, prevention of migraines, behavioral and other nonpharmacologic treatments for headache.   Orders Placed This Encounter  Procedures  . MR BRAIN W WO CONTRAST  . MR MRV HEAD WO CM  . Ambulatory referral to Physical Therapy   Meds ordered this encounter  Medications  . amitriptyline (ELAVIL) 25 MG tablet    Sig: Take 1 tablet (25 mg total) by mouth at bedtime.    Dispense:  30 tablet     Refill:  3   Performed by Dr. Jaynee Eagles M.D.  All procedures a documented blood were medically necessary, reasonable and appropriate based on the patient's history, medical diagnosis and physician opinion. Verbal informed consent was obtained from the patient, patient was informed of potential risk of procedure, including bruising, bleeding, hematoma formation, infection, muscle weakness, muscle pain, numbness, transient hypertension, transient hyperglycemia and transient insomnia among others. All areas injected were topically clean with isopropyl rubbing alcohol. Nonsterile nonlatex gloves were worn during the procedure.  1. Greater occipital nerve block [phone]). The greater occipital nerve site was identified at the nuchal line medial to the occipital artery. Medication was injected into the left and right occipital nerve areas and suboccipital areas. Patient's condition is associated with inflammation of the greater occipital nerve and associated multiple groups. Injection was deemed medically necessary, reasonable and appropriate. Injection represents a separate and unique surgical service.  2. Supraorbital nerve block (64400): Supraorbital nerve site was identified along the incision of the frontal bone on the orbital/supraorbital ridge. Medication was injected into the left and right supraorbital nerve areas. Patient's condition is associated with inflammation of the supraorbital and associated muscle groups. Injection was deemed medically necessary, reasonable and appropriate. Injection represents a separate and unique surgical service.   Cc: McLean-Scocuzza, Catheryn Bacon, MD  [facility] [address] [place], [place] [address]  Phone [phone] Fax [phone]

## 2019-10-19 NOTE — Progress Notes (Signed)
Nerve block w/o steroid: Pt signed consent  0.5% Bupivocaine 6 mL LOT: [id number] EXP: 01/24 NDC: 37106-269-48  2% Lidocaine 6 mL LOT: [id number] EXP: 01/03/20 NDC: 5462-7035-00

## 2019-10-20 ENCOUNTER — Ambulatory Visit: Payer: 59 | Admitting: Internal Medicine

## 2019-10-20 ENCOUNTER — Encounter: Payer: Self-pay | Admitting: Internal Medicine

## 2019-10-20 ENCOUNTER — Other Ambulatory Visit: Payer: Self-pay

## 2019-10-20 VITALS — BP 126/80 | HR 100 | Temp 98.4°F | Ht 71.0 in | Wt 256.4 lb

## 2019-10-20 DIAGNOSIS — E669 Obesity, unspecified: Secondary | ICD-10-CM

## 2019-10-20 DIAGNOSIS — G44009 Cluster headache syndrome, unspecified, not intractable: Secondary | ICD-10-CM

## 2019-10-20 DIAGNOSIS — N62 Hypertrophy of breast: Secondary | ICD-10-CM

## 2019-10-20 DIAGNOSIS — R519 Headache, unspecified: Secondary | ICD-10-CM | POA: Diagnosis not present

## 2019-10-20 DIAGNOSIS — M542 Cervicalgia: Secondary | ICD-10-CM | POA: Diagnosis not present

## 2019-10-20 DIAGNOSIS — U071 COVID-19: Secondary | ICD-10-CM | POA: Diagnosis not present

## 2019-10-20 DIAGNOSIS — R5383 Other fatigue: Secondary | ICD-10-CM

## 2019-10-20 MED ORDER — CYCLOBENZAPRINE HCL 5 MG PO TABS: 5.0000 mg | ORAL_TABLET | Freq: Every evening | ORAL | 0 refills | Status: DC | PRN

## 2019-10-20 MED ORDER — METHYLPREDNISOLONE ACETATE 40 MG/ML IJ SUSP
40.0000 mg | Freq: Once | INTRAMUSCULAR | Status: AC
  Administered 2019-10-20: 40 mg via INTRAMUSCULAR

## 2019-10-20 NOTE — Patient Instructions (Addendum)
B12 1000 mcg  Vitamin D3 4000 IU daily   Magnesium 250 mg daily for migraine   Consider imitrex if imaging negative consider emgality  Botox    Cluster Headache A cluster headache is a type of headache that causes deep, intense head pain. Cluster headaches can last from 15 minutes to 3 hours. They usually occur:  On one side of the head. They may occur on the other side when a new cluster of headaches begins.  Repeatedly over weeks to months.  Several times a day.  At the same time of day, often at night.  More often in the fall and springtime. What are the causes? The cause of this condition is not known. What increases the risk? This condition is more likely to develop in:  Males.  People who drink alcohol.  People who smoke or use products that contain nicotine or tobacco.  People who take medicines that cause blood vessels to expand, such as nitroglycerin.  People who take antihistamines. What are the signs or symptoms? Symptoms of this condition include:  Severe pain on one side of the head that begins behind or around your eye or temple.  Pain on one side of the head.  Nausea.  Sensitivity to light.  Runny nose and nasal stuffiness.  Sweaty, pale skin on the face.  Droopy or swollen eyelid, eye redness, or tearing.  Restlessness and agitation. How is this diagnosed? This condition may be diagnosed based on:  Your symptoms.  A physical exam. Your health care provider may order tests to see if your headaches are caused by another medical condition. These tests may show that you do not have cluster headaches. Tests may include:  A CT scan of your head.  An MRI of your head.  Lab tests. How is this treated? This condition may be treated with:  Medicines to relieve pain and to prevent repeated (recurrent) attacks. Some people may need a combination of medicines.  Oxygen. This helps to relieve pain. Follow these instructions at home: Headache  diary Keep a headache diary as told by your health care provider. Doing this can help you and your health care provider figure out what triggers your headaches. In your headache diary, include information about:  The time of day that your headache started and what you were doing when it began.  How long your headache lasted.  Where your pain started and whether it moved to other areas.  The type of pain, such as burning, stabbing, throbbing, or cramping.  Your level of pain. Use a pain scale and rate the pain with a number from 1 (mild) up to 10 (severe).  The treatment that you used, and any change in symptoms after treatment.  Medicines  Take over-the-counter and prescription medicines only as told by your health care provider.  Do not drive or use heavy machinery while taking prescription pain medicine.  Use oxygen as told by your health care provider. Lifestyle  Follow a regular sleep schedule. Do not vary the time that you go to bed or the amount that you sleep from day to day. It is important to stay on the same schedule during a cluster period to help prevent headaches.  Exercise regularly.  Eat a healthy diet and avoid foods that may trigger your headaches.  Avoid alcohol.  Do not use any products that contain nicotine or tobacco, such as cigarettes and e-cigarettes. If you need help quitting, ask your health care provider. Contact a health care  provider if:  Your headaches change, become more severe, or occur more often.  The medicine or oxygen that your health care provider recommended does not help. Get help right away if:  You faint.  You have weakness or numbness, especially on one side of your body or face.  You have double vision.  You have nausea or vomiting that does not go away within several hours.  You have trouble talking, walking, or keeping your balance.  You have pain or stiffness in your neck.  You have a fever. Summary  A cluster  headache is a type of headache that causes deep, intense head pain, usually on one side of the head.  Keep a headache diary to help discover what triggers your headaches.  A regular sleep schedule can help prevent headaches. This information is not intended to replace advice given to you by your health care provider. Make sure you discuss any questions you have with your health care provider. Document Revised: 06/29/2018 Document Reviewed: 10/01/2015 Elsevier Patient Education  2020 [employer]  Sumatriptan injection What is this medicine? SUMATRIPTAN (soo ma TRIP tan) is used to treat migraines with or without aura. An aura is a strange feeling or visual disturbance that warns you of an attack. It is not used to prevent migraines. This medicine may be used for other purposes; ask your health care provider or pharmacist if you have questions. COMMON BRAND NAME(S): Alsuma, Imitrex, Imitrex STAT dose, Sumavel DosePro System, ZEMBRACE What should I tell my health care provider before I take this medicine? They need to know if you have any of these conditions:  cigarette smoker  circulation problems in fingers and toes  diabetes  heart disease  high blood pressure  high cholesterol  history of irregular heartbeat  history of stroke  kidney disease  liver disease  stomach or intestine problems  an unusual or allergic reaction to sumatriptan, latex, other medicines, foods, dyes, or preservatives  pregnant or trying to get pregnant  breast-feeding How should I use this medicine? This medicine is for injection under the skin. You will be taught how to prepare and give this medicine. Use exactly as directed. Do not take your medicine more often than directed. Talk to your pediatrician regarding the use of this medicine in children. Special care may be needed. Overdosage: If you think you have taken too much of this medicine contact a poison control center or emergency room at  once. NOTE: This medicine is only for you. Do not share this medicine with others. What if I miss a dose? This does not apply. This medicine is not for regular use. What may interact with this medicine? Do not take this medicine with any of the following medicines:  certain medicines for migraine headache like almotriptan, eletriptan, frovatriptan, naratriptan, rizatriptan, sumatriptan, zolmitriptan  ergot alkaloids like dihydroergotamine, ergonovine, ergotamine, methylergonovine  MAOIs like Carbex, Eldepryl, Marplan, Nardil, and Parnate This medicine may also interact with the following medications:  certain medicines for depression, anxiety, or psychotic disorders This list may not describe all possible interactions. Give your health care provider a list of all the medicines, herbs, non-prescription drugs, or dietary supplements you use. Also tell them if you smoke, drink alcohol, or use illegal drugs. Some items may interact with your medicine. What should I watch for while using this medicine? Visit your healthcare professional for regular checks on your progress. Tell your healthcare professional if your symptoms do not start to get better or if they  get worse. You may get drowsy or dizzy. Do not drive, use machinery, or do anything that needs mental alertness until you know how this medicine affects you. Do not stand up or sit up quickly, especially if you are an older patient. This reduces the risk of dizzy or fainting spells. Alcohol may interfere with the effect of this medicine. Tell your healthcare professional right away if you have any change in your eyesight. If you take migraine medicines for 10 or more days a month, your migraines may get worse. Keep a diary of headache days and medicine use. Contact your healthcare professional if your migraine attacks occur more frequently. What side effects may I notice from receiving this medicine? Side effects that you should report to your  doctor or health care professional as soon as possible:  allergic reactions like skin rash, itching or hives, swelling of the face, lips, or tongue  changes in vision  chest pain or chest tightness  signs and symptoms of a dangerous change in heartbeat or heart rhythm like chest pain; dizziness; fast, irregular heartbeat; palpitations; feeling faint or lightheaded; falls; breathing problems  signs and symptoms of a stroke like changes in vision; confusion; trouble speaking or understanding; severe headaches; sudden numbness or weakness of the face, arm or leg; trouble walking; dizziness; loss of balance or coordination  signs and symptoms of serotonin syndrome like irritable; confusion; diarrhea; fast or irregular heartbeat; muscle twitching; stiff muscles; trouble walking; sweating; high fever; seizures; chills; vomiting Side effects that usually do not require medical attention (report to your doctor or health care professional if they continue or are bothersome):  diarrhea  dizziness  drowsiness  dry mouth  headache  nausea, vomiting  pain, redness, or irritation at site where injected  pain, tingling, numbness in the hands or feet  stomach pain This list may not describe all possible side effects. Call your doctor for medical advice about side effects. You may report side effects to FDA at 1-800-FDA-1088. Where should I keep my medicine? Keep out of the reach of children. You will be instructed on how to store this medicine. Throw away any unused medicine after the expiration date on the label. NOTE: This sheet is a summary. It may not cover all possible information. If you have questions about this medicine, talk to your doctor, pharmacist, or health care provider.  2020 Elsevier/[employer] Standard (2017-06-04 12:58:21)  Galcanezumab injection What is this medicine? GALCANEZUMAB (gal ka NEZ ue mab) is used to prevent migraines and treat cluster headaches. This medicine may  be used for other purposes; ask your health care provider or pharmacist if you have questions. COMMON BRAND NAME(S): Emgality What should I tell my health care provider before I take this medicine? They need to know if you have any of these conditions:  an unusual or allergic reaction to galcanezumab, other medicines, foods, dyes, or preservatives  pregnant or trying to get pregnant  breast-feeding How should I use this medicine? This medicine is for injection under the skin. You will be taught how to prepare and give this medicine. Use exactly as directed. Take your medicine at regular intervals. Do not take your medicine more often than directed. It is important that you put your used needles and syringes in a special sharps container. Do not put them in a trash can. If you do not have a sharps container, call your pharmacist or healthcare provider to get one. Talk to your pediatrician regarding the use of this medicine  in children. Special care may be needed. Overdosage: If you think you have taken too much of this medicine contact a poison control center or emergency room at once. NOTE: This medicine is only for you. Do not share this medicine with others. What if I miss a dose? If you miss a dose, take it as soon as you can. If it is almost time for your next dose, take only that dose. Do not take double or extra doses. What may interact with this medicine? Interactions are not expected. This list may not describe all possible interactions. Give your health care provider a list of all the medicines, herbs, non-prescription drugs, or dietary supplements you use. Also tell them if you smoke, drink alcohol, or use illegal drugs. Some items may interact with your medicine. What should I watch for while using this medicine? Tell your doctor or healthcare professional if your symptoms do not start to get better or if they get worse. What side effects may I notice from receiving this  medicine? Side effects that you should report to your doctor or health care professional as soon as possible:  allergic reactions like skin rash, itching or hives, swelling of the face, lips, or tongue Side effects that usually do not require medical attention (report these to your doctor or health care professional if they continue or are bothersome):  pain, redness, or irritation at site where injected This list may not describe all possible side effects. Call your doctor for medical advice about side effects. You may report side effects to FDA at 1-800-FDA-1088. Where should I keep my medicine? Keep out of the reach of children. You will be instructed on how to store this medicine. Throw away any unused medicine after the expiration date on the label. NOTE: This sheet is a summary. It may not cover all possible information. If you have questions about this medicine, talk to your doctor, pharmacist, or health care provider.  2020 [employer]vier/Gold Standard (2017-07-07 12:03:23)

## 2019-10-20 NOTE — Progress Notes (Signed)
Chief Complaint  Patient presents with  . Follow-up   F/u  1. covid 19 09/11/19 filled out FMLA paperwork and given to pt today still having residual h/a and fatigue and right leg falling asleep at times MRI/MRV scheduled.  She had nerve blocks yesterday with [facility] with helped for 2 hours but did not last longer she had vision checked 6 months ago 20/30 vision and wears glasses for computer and reading. H/o Cluster migraines as a child but h/a now are behind left eye 8/10 today for now will try otc meds. Denies caffeine and she quit smoking. She is sleeping 7 pm to the am  She reports she contracted covid from her brothers son  She is also having some neck stiffness/pain  2. Large breasts breast reduction sch 11/2019 with plastics disc with patient will need cont of FMLA per plastics and re initiated FMLA for this surgery    Review of Systems  Constitutional: Positive for malaise/fatigue. Negative for weight loss.  HENT: Negative for hearing loss.   Eyes: Negative for blurred vision.  Respiratory: Negative for shortness of breath.   Cardiovascular: Negative for chest pain.  Musculoskeletal: Positive for neck pain.  Skin: Negative for rash.  Neurological: Positive for headaches.  Psychiatric/Behavioral: Negative for depression. The patient does not have insomnia.    Past Medical History:  Diagnosis Date  . Anemia   . Anxiety and depression    lexapro 10 mg no help, zoloft like zombie  . BRCA negative    per pt report, done through [facility]  . Chronic low back pain    11/05/2018 MRI L spine 3. 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 COVID-19    09/11/19  . COVID-19    09/11/19  . Heavy menses   . Increased risk of breast cancer    IBIS=25.3%  . Large breasts   . Ovarian cyst 2007   Past Surgical History:  Procedure Laterality Date  . CESAREAN SECTION  2008   FTP  . CESAREAN SECTION WITH BILATERAL TUBAL LIGATION N/A 02/04/2016   Procedure: CESAREAN SECTION WITH  BILATERAL TUBAL LIGATION;  Surgeon: Will Bonnet, MD;  Location: [facility] ORS;  Service: Obstetrics;  Laterality: N/A;  . LAPAROTOMY  2007   ruptured ovarian cyst Dr. Ammie Dalton    Family History  Problem Relation Age of Onset  . Brain cancer Mother   . Hypertension Mother   . Ovarian cancer Mother 70  . Lung cancer Mother   . Cancer Mother        ovarian to lung to brain  . Headache Mother   . Diabetes Mellitus I Father   . Pancreatic cancer Father 26  . Cancer Father        stomach cancer  . Diabetes Father   . Hypertension Brother   . Breast cancer Maternal Aunt 26  . Diabetes Mellitus II Paternal Aunt   . Diabetes Mellitus II Paternal Grandmother   . Breast cancer Maternal Aunt 79  . Anxiety disorder Brother    Social History   Socioeconomic History  . Marital status: Single    Spouse name: Not on file  . Number of children: 2  . Years of education: Not on file  . Highest education level: Not on file  Occupational History  . Not on file  Tobacco Use  . Smoking status: Former Smoker    Packs/day: 0.25    Quit date: 09/2019    Years since quitting: 0.1  .  Smokeless tobacco: Never Used  Substance and Sexual Activity  . Alcohol use: No  . Drug use: No  . Sexual activity: Yes    Partners: Male    Birth control/protection: Surgical  Other Topics Concern  . Not on file  Social History Narrative   DPR brother Mary-Anne Polizzi [phone]   34 y.o [place] as of 01/13/19   2 y.o daughter as of 01/13/2019    Single    4 brothers    Smoker as of 01/13/19    Works [place] [employer]       Lives at home with her children   Right handed   Caffeine: maybe 2 cups/day   Social Determinants of Health   Financial Resource Strain:   . Difficulty of Paying Living Expenses: Not on file  Food Insecurity:   . Worried About Charity fundraiser in the Last Year: Not on file  . Ran Out of Food in the Last Year: Not on file  Transportation Needs:   . Lack of  Transportation (Medical): Not on file  . Lack of Transportation (Non-Medical): Not on file  Physical Activity:   . Days of Exercise per Week: Not on file  . Minutes of Exercise per Session: Not on file  Stress:   . Feeling of Stress : Not on file  Social Connections:   . Frequency of Communication with Friends and Family: Not on file  . Frequency of Social Gatherings with Friends and Family: Not on file  . Attends Religious Services: Not on file  . Active Member of Clubs or Organizations: Not on file  . Attends Archivist Meetings: Not on file  . Marital Status: Not on file  Intimate Partner Violence:   . Fear of Current or Ex-Partner: Not on file  . Emotionally Abused: Not on file  . Physically Abused: Not on file  . Sexually Abused: Not on file   Current Meds  Medication Sig  . acetaminophen (TYLENOL) 500 MG tablet Take 1 tablet (500 mg total) by mouth every 6 (six) hours as needed. For use AFTER surgery  . albuterol (VENTOLIN HFA) 108 (90 Base) MCG/ACT inhaler Inhale 1-2 puffs into the lungs every 4 (four) hours as needed for wheezing or shortness of breath.  Marland Kitchen amitriptyline (ELAVIL) 25 MG tablet Take 1 tablet (25 mg total) by mouth at bedtime.  . Cholecalciferol 1.25 MG (50000 UT) capsule Take 1 capsule (50,000 Units total) by mouth once a week.   Allergies  Allergen Reactions  . Chantix [Varenicline]     Crying   . Lexapro [Escitalopram]     No help   . Zoloft [Sertraline]     Zombie    Recent Results (from the past 2160 hour(s))  Vitamin D (25 hydroxy)     Status: None   Collection Time: 08/18/19  8:02 AM  Result Value Ref Range   VITD 33.42 30.00 - 100.00 ng/mL  CBC with Differential/Platelet     Status: Abnormal   Collection Time: 08/18/19  8:02 AM  Result Value Ref Range   WBC 13.0 (H) 4.0 - 10.5 K/uL   RBC 4.18 3.87 - 5.11 Mil/uL   Hemoglobin 12.6 12.0 - 15.0 g/dL   HCT 36.9 36 - 46 %   MCV 88.3 78.0 - 100.0 fl   MCHC 34.1 30.0 - 36.0 g/dL    RDW 13.9 11.5 - 15.5 %   Platelets 323.0 150 - 400 K/uL   Neutrophils Relative % 70.3  43 - 77 %   Lymphocytes Relative 20.8 12 - 46 %   Monocytes Relative 7.0 3 - 12 %   Eosinophils Relative 1.5 0 - 5 %   Basophils Relative 0.4 0 - 3 %   Neutro Abs 9.1 (H) 1.4 - 7.7 K/uL   Lymphs Abs 2.7 0.7 - 4.0 K/uL   Monocytes Absolute 0.9 0 - 1 K/uL   Eosinophils Absolute 0.2 0 - 0 K/uL   Basophils Absolute 0.1 0 - 0 K/uL  Lipid panel     Status: Abnormal   Collection Time: 08/18/19  8:02 AM  Result Value Ref Range   Cholesterol 169 0 - 200 mg/dL    Comment: ATP III Classification       Desirable:  < 200 mg/dL               Borderline High:  200 - 239 mg/dL          High:  > = 240 mg/dL   Triglycerides 96.0 0 - 149 mg/dL    Comment: Normal:  <150 mg/dLBorderline High:  150 - 199 mg/dL   HDL 37.30 (L) >39.00 mg/dL   VLDL 19.2 0.0 - 40.0 mg/dL   LDL Cholesterol 113 (H) 0 - 99 mg/dL   Total CHOL/HDL Ratio 5     Comment:                Men          Women1/2 Average Risk     3.4          3.3Average Risk          5.0          4.42X Average Risk          9.6          7.13X Average Risk          15.0          11.0                       NonHDL 132.13     Comment: NOTE:  Non-HDL goal should be 30 mg/dL higher than patient's LDL goal (i.e. LDL goal of < 70 mg/dL, would have non-HDL goal of < 100 mg/dL)  Urine cytology ancillary only(Gap)     Status: None   Collection Time: 08/18/19  8:02 AM  Result Value Ref Range   Trichomonas Negative    Comment Normal Reference Range Trichomonas - Negative   SARS CORONAVIRUS 2 (TAT 6-24 HRS) Nasopharyngeal Nasopharyngeal Swab     Status: Abnormal   Collection Time: 09/11/19 11:54 AM   Specimen: Nasopharyngeal Swab  Result Value Ref Range   SARS Coronavirus 2 POSITIVE (A) NEGATIVE    Comment: REPEATED TO VERIFY (NOTE) SARS-CoV-2 target nucleic acids are DETECTED.  The SARS-CoV-2 RNA is generally detectable in upper and lower respiratory specimens during  the acute phase of infection. Positive results are indicative of the presence of SARS-CoV-2 RNA. Clinical correlation with patient history and other diagnostic information is  necessary to determine patient infection status. Positive results do not rule out bacterial infection or co-infection with other viruses.  The expected result is Negative.  Fact Sheet for Patients: SugarRoll.be  Fact Sheet for Healthcare Providers: https://www.woods-mathews.com/  This test is not yet approved or cleared by the [place] FDA and  has been authorized for detection and/or diagnosis of SARS-CoV-2 by FDA under an Emergency Use Authorization (EUA).  This EUA will remain  in effect (meaning this test can be used) for the duration of the COVID-19 declaration under Section 564(b)( 1) of the Act, 21 U.S.C. section 360bbb-3(b)(1), unless the authorization is terminated or revoked sooner.   Performed at [facility], [place] [address] [place], [place] [address]   Basic metabolic panel     Status: None   Collection Time: 09/13/19  1:19 PM  Result Value Ref Range   Sodium 138 135 - 145 mmol/L   Potassium 3.8 3.5 - 5.1 mmol/L   Chloride 104 98 - 111 mmol/L   CO2 24 22 - 32 mmol/L   Glucose, Bld 92 70 - 99 mg/dL    Comment: Glucose reference range applies only to samples taken after fasting for at least 8 hours.   BUN 7 6 - 20 mg/dL   Creatinine, Ser 0.72 0.44 - 1.00 mg/dL   Calcium 8.9 8.9 - 10.3 mg/dL   GFR calc non Af Amer >60 >60 mL/min   GFR calc Af Amer >60 >60 mL/min   Anion gap 10 5 - 15    Comment: Performed at [facility], [address] [place], [place] [address]  CBC     Status: None   Collection Time: 09/13/19  1:19 PM  Result Value Ref Range   WBC 9.8 4.0 - 10.5 K/uL   RBC 4.30 3.87 - 5.11 MIL/uL   Hemoglobin 13.0 12.0 - 15.0 g/dL   HCT 36.9 36 - 46 %   MCV 85.8 80.0 - 100.0 fL   MCH 30.2 26.0 - 34.0 pg   MCHC 35.2 30.0 -  36.0 g/dL   RDW 13.4 11.5 - 15.5 %   Platelets 284 150 - 400 K/uL   nRBC 0.0 0.0 - 0.2 %    Comment: Performed at [facility], [address], [place], [place] [address]  Troponin I (High Sensitivity)     Status: None   Collection Time: 09/13/19  1:19 PM  Result Value Ref Range   Troponin I (High Sensitivity) <2 <18 ng/L    Comment: (NOTE) Elevated high sensitivity troponin I (hsTnI) values and significant  changes across serial measurements may suggest ACS but many other  chronic and acute conditions are known to elevate hsTnI results.  Refer to the "Links" section for chest pain algorithms and additional  guidance. Performed at [facility], [address] [place], [place] [address]   Brain natriuretic peptide     Status: None   Collection Time: 09/13/19  1:24 PM  Result Value Ref Range   B Natriuretic Peptide 43.9 0.0 - 100.0 pg/mL    Comment: Performed at [facility], [address] [place], [place] [address]  CBC with Differential     Status: Abnormal   Collection Time: 09/29/19  6:52 PM  Result Value Ref Range   WBC 9.9 4.0 - 10.5 K/uL   RBC 4.07 3.87 - 5.11 MIL/uL   Hemoglobin 11.9 (L) 12.0 - 15.0 g/dL   HCT 35.1 (L) 36 - 46 %   MCV 86.2 80.0 - 100.0 fL   MCH 29.2 26.0 - 34.0 pg   MCHC 33.9 30.0 - 36.0 g/dL   RDW 13.2 11.5 - 15.5 %   Platelets 258 150 - 400 K/uL   nRBC 0.0 0.0 - 0.2 %   Neutrophils Relative % 62 %   Neutro Abs 6.1 1.7 - 7.7 K/uL   Lymphocytes Relative 28 %   Lymphs Abs  2.8 0.7 - 4.0 K/uL   Monocytes Relative 9 %   Monocytes Absolute 0.9 0 - 1 K/uL   Eosinophils Relative 1 %   Eosinophils Absolute 0.1 0 - 0 K/uL   Basophils Relative 0 %   Basophils Absolute 0.0 0 - 0 K/uL   Immature Granulocytes 0 %   Abs Immature Granulocytes 0.03 0.00 - 0.07 K/uL    Comment: Performed at [facility], [address] [place], [place] [address]  Basic metabolic panel     Status: Abnormal   Collection Time:  09/29/19  6:52 PM  Result Value Ref Range   Sodium 140 135 - 145 mmol/L   Potassium 3.5 3.5 - 5.1 mmol/L   Chloride 110 98 - 111 mmol/L   CO2 22 22 - 32 mmol/L   Glucose, Bld 95 70 - 99 mg/dL    Comment: Glucose reference range applies only to samples taken after fasting for at least 8 hours.   BUN 9 6 - 20 mg/dL   Creatinine, Ser 0.62 0.44 - 1.00 mg/dL   Calcium 8.5 (L) 8.9 - 10.3 mg/dL   GFR calc non Af Amer >60 >60 mL/min   GFR calc Af Amer >60 >60 mL/min   Anion gap 8 5 - 15    Comment: Performed at [facility], [address] [place], [place] [address]  Fibrin derivatives D-Dimer     Status: Abnormal   Collection Time: 09/29/19  6:52 PM  Result Value Ref Range   Fibrin derivatives D-dimer ([facility]) 549.06 (H) 0.00 - 499.00 ng/mL (FEU)    Comment: (NOTE) <> Exclusion of Venous Thromboembolism (VTE) - OUTPATIENT ONLY   (Emergency Department or [place])    0-499 ng/ml (FEU): With a low to intermediate pretest probability                      for VTE this test result excludes the diagnosis                      of VTE.   >499 ng/ml (FEU) : VTE not excluded; additional work up for VTE is                      required.  <> Testing on Inpatients and Evaluation of Disseminated Intravascular   Coagulation (DIC) Reference Range:   0-499 ng/ml (FEU) Performed at [facility], [address], [place], [place] [address]   Troponin I (High Sensitivity)     Status: None   Collection Time: 09/29/19  6:52 PM  Result Value Ref Range   Troponin I (High Sensitivity) <2 <18 ng/L    Comment: (NOTE) Elevated high sensitivity troponin I (hsTnI) values and significant  changes across serial measurements may suggest ACS but many other  chronic and acute conditions are known to elevate hsTnI results.  Refer to the "Links" section for chest pain algorithms and additional  guidance. Performed at [facility], [address], [place], [place] [address]     Objective  Body mass index is 35.76 kg/m. Wt Readings from Last 3 Encounters:  10/20/19 256 lb 6.4 oz (116.3 kg)  10/19/19 257 lb (116.6 kg)  09/29/19 250 lb (113.4 kg)   Temp Readings from Last 3 Encounters:  10/20/19 98.4 F (36.9 C) (Oral)  09/29/19 98.1 F (36.7 C) (Oral)  09/13/19 98.9 F (37.2 C) (Oral)   BP Readings from Last 3  Encounters:  10/20/19 126/80  10/19/19 (!) 130/58  09/29/19 (!) 121/95   Pulse Readings from Last 3 Encounters:  10/20/19 100  10/19/19 72  09/29/19 79    Physical Exam Vitals and nursing note reviewed.  Constitutional:      Appearance: Normal appearance. She is well-developed and well-groomed. She is obese.  HENT:     Head: Normocephalic and atraumatic.  Eyes:     Conjunctiva/sclera: Conjunctivae normal.     Pupils: Pupils are equal, round, and reactive to light.  Cardiovascular:     Rate and Rhythm: Normal rate and regular rhythm.     Heart sounds: Normal heart sounds. No murmur heard.   Pulmonary:     Effort: Pulmonary effort is normal.     Breath sounds: Normal breath sounds.  Skin:    General: Skin is warm and dry.  Neurological:     General: No focal deficit present.     Mental Status: She is alert and oriented to person, place, and time. Mental status is at baseline.     Gait: Gait normal.  Psychiatric:        Attention and Perception: Attention and perception normal.        Mood and Affect: Mood and affect normal.        Speech: Speech normal.        Behavior: Behavior normal. Behavior is cooperative.        Thought Content: Thought content normal.        Cognition and Memory: Cognition and memory normal.        Judgment: Judgment normal.     Assessment  Plan  Fatigue, post covid Plan:  rec B12 1000 mcg otc +D3 4000 IU daily  congrats stopped smoking   Acute intractable headache, unspecified headache type with h/o cluster migraines -  Plan: methylPREDNISolone acetate (DEPO-MEDROL) injection 40 mg w/o relief  and flexeril did not help Nerve blocks also did not help with neurology  MRI/V pending F/u neurology  Consider imitrex if MRI/V negative disc with Neurology Dr. Jaynee Eagles Also disc emgality vs botox and magnesium 250 mg qd otc  Cervicalgia - Plan: cyclobenzaprine (FLEXERIL) 5 MG tablet, methylPREDNISolone acetate (DEPO-MEDROL) injection 40 mg  Migraine-cluster headache syndrome - Plan: methylPREDNISolone acetate (DEPO-MEDROL) injection 40 mg See above h/a  Large breasts Reduction sch 11/2019 will need plastics to fill out fmla  Obesity (BMI 30-39.9)  rec healthy diet and exercise   HM Flu shot will need 2021  Tdap utd covid 19 vaccine will rec 90 days after 09/11/19 +covid   Pap neg 09/19/18 except trich re check; per pt had HPV vaccine  -off seasonale due to DUB/cramping  Smoker 1/4 th to < 1/2 ppd tried chantix which made her cry  -as of 10/20/19 quit   FH cancer mom and dad saw genetic counselor in 2012 inconclusive results/not enough knowledge consider 2nd consultation  rec healthy diet and exercise  Mammogram 06/12/19 negative   Colonoscopy age 34   Provider: Dr. Olivia Mackie McLean-Scocuzza-Internal Medicine

## 2019-10-21 ENCOUNTER — Telehealth: Payer: Self-pay | Admitting: Neurology

## 2019-10-21 ENCOUNTER — Encounter: Payer: Self-pay | Admitting: Internal Medicine

## 2019-10-21 NOTE — Telephone Encounter (Signed)
Can you ask if she needs medication for her MRI, I don;t think I asked her and she may be in there a while thanks

## 2019-10-23 DIAGNOSIS — E669 Obesity, unspecified: Secondary | ICD-10-CM | POA: Insufficient documentation

## 2019-10-23 NOTE — Telephone Encounter (Signed)
I spoke with the patient last Thursday and she informed me that she is not claustrophobic so she is good to go on the mobile unit.

## 2019-10-23 NOTE — Telephone Encounter (Signed)
Complete f/u neurology h/a  Fmla paperwork given 10/20/19 appt

## 2019-10-24 ENCOUNTER — Ambulatory Visit (INDEPENDENT_AMBULATORY_CARE_PROVIDER_SITE_OTHER): Payer: 59

## 2019-10-24 ENCOUNTER — Other Ambulatory Visit: Payer: Self-pay

## 2019-10-24 DIAGNOSIS — U071 COVID-19: Secondary | ICD-10-CM | POA: Diagnosis not present

## 2019-10-24 DIAGNOSIS — R519 Headache, unspecified: Secondary | ICD-10-CM

## 2019-10-24 DIAGNOSIS — R51 Headache with orthostatic component, not elsewhere classified: Secondary | ICD-10-CM | POA: Diagnosis not present

## 2019-10-24 DIAGNOSIS — R200 Anesthesia of skin: Secondary | ICD-10-CM | POA: Diagnosis not present

## 2019-10-24 DIAGNOSIS — H539 Unspecified visual disturbance: Secondary | ICD-10-CM | POA: Diagnosis not present

## 2019-10-24 DIAGNOSIS — G08 Intracranial and intraspinal phlebitis and thrombophlebitis: Secondary | ICD-10-CM | POA: Diagnosis not present

## 2019-10-24 DIAGNOSIS — R29898 Other symptoms and signs involving the musculoskeletal system: Secondary | ICD-10-CM

## 2019-10-24 DIAGNOSIS — R29818 Other symptoms and signs involving the nervous system: Secondary | ICD-10-CM

## 2019-10-24 MED ORDER — GADOBENATE DIMEGLUMINE 529 MG/ML IV SOLN
20.0000 mL | Freq: Once | INTRAVENOUS | Status: AC | PRN
  Administered 2019-10-24: 20 mL via INTRAVENOUS

## 2019-10-31 ENCOUNTER — Other Ambulatory Visit: Payer: Self-pay

## 2019-10-31 ENCOUNTER — Encounter (HOSPITAL_BASED_OUTPATIENT_CLINIC_OR_DEPARTMENT_OTHER): Payer: Self-pay | Admitting: Plastic Surgery

## 2019-11-01 NOTE — Progress Notes (Signed)
   Patient tested positive on 09/11/19, resutlts are in Epic.  Patient is with in the 90 day window of her positive test so she does not need to be retested for her procedure on 11/06/19. Left message for patient to make her aware   SARS Coronavirus 2 NEGATIVE POSITIVE Abnormal    Comment: REPEATED TO VERIFY  (NOTE)  SARS-CoV-2 target nucleic acids are DETECTED.   The SARS-CoV-2 RNA is generally detectable in upper and lower  respiratory specimens during the acute phase of infection. Positive  results are indicative of the presence of SARS-CoV-2 RNA. Clinical  correlation with patient history and other diagnostic information is  necessary to determine patient infection status. Positive results do  not rule out bacterial infection or co-infection with other viruses.  The expected result is Negative.   Fact Sheet for Patients:  HairSlick.no   Fact Sheet for Healthcare Providers:  quierodirigir.com   This test is not yet approved or cleared by the [place] FDA and  has been authorized for detection and/or diagnosis of SARS-CoV-2 by  FDA under an Emergency Use Authorization (EUA). This EUA will remain  in effect (meaning this test can be used) for the duration of the  COVID-19 declaration under Section 564(b)(1) of the Act, 21 U.S.C.  section 360bbb-3(b)(1), unless the authorization is terminated or  revoked sooner.    Performed at [facility], [address], [place], [place]  [address]   Resulting Agency  [facility]    Specimen Collected: 09/11/19 11:54 Last Resulted: 09/11/19 23:55

## 2019-11-02 ENCOUNTER — Inpatient Hospital Stay (HOSPITAL_COMMUNITY)
Admission: RE | Admit: 2019-11-02 | Discharge: 2019-11-02 | Disposition: A | Discharge status: Home / Self Care | Payer: 59 | Source: Ambulatory Visit | Attending: Plastic Surgery | Admitting: Plastic Surgery

## 2019-11-06 ENCOUNTER — Ambulatory Visit (HOSPITAL_BASED_OUTPATIENT_CLINIC_OR_DEPARTMENT_OTHER): Payer: 59 | Admitting: Certified Registered Nurse Anesthetist

## 2019-11-06 ENCOUNTER — Ambulatory Visit (HOSPITAL_BASED_OUTPATIENT_CLINIC_OR_DEPARTMENT_OTHER)
Admission: RE | Admit: 2019-11-06 | Discharge: 2019-11-06 | Disposition: A | Discharge status: Home / Self Care | Payer: 59 | Attending: Plastic Surgery | Admitting: Plastic Surgery

## 2019-11-06 ENCOUNTER — Encounter (HOSPITAL_BASED_OUTPATIENT_CLINIC_OR_DEPARTMENT_OTHER): Payer: Self-pay | Admitting: Plastic Surgery

## 2019-11-06 ENCOUNTER — Encounter (HOSPITAL_BASED_OUTPATIENT_CLINIC_OR_DEPARTMENT_OTHER)
Admission: RE | Disposition: A | Discharge status: Home / Self Care | Payer: Self-pay | Source: Home / Self Care | Attending: Plastic Surgery

## 2019-11-06 ENCOUNTER — Other Ambulatory Visit: Payer: Self-pay

## 2019-11-06 DIAGNOSIS — F418 Other specified anxiety disorders: Secondary | ICD-10-CM | POA: Diagnosis not present

## 2019-11-06 DIAGNOSIS — Z87891 Personal history of nicotine dependence: Secondary | ICD-10-CM | POA: Insufficient documentation

## 2019-11-06 DIAGNOSIS — Z8616 Personal history of COVID-19: Secondary | ICD-10-CM | POA: Diagnosis not present

## 2019-11-06 DIAGNOSIS — N62 Hypertrophy of breast: Secondary | ICD-10-CM | POA: Diagnosis not present

## 2019-11-06 DIAGNOSIS — G8929 Other chronic pain: Secondary | ICD-10-CM | POA: Diagnosis not present

## 2019-11-06 DIAGNOSIS — E559 Vitamin D deficiency, unspecified: Secondary | ICD-10-CM | POA: Diagnosis not present

## 2019-11-06 DIAGNOSIS — Z79899 Other long term (current) drug therapy: Secondary | ICD-10-CM | POA: Diagnosis not present

## 2019-11-06 DIAGNOSIS — M542 Cervicalgia: Secondary | ICD-10-CM | POA: Diagnosis not present

## 2019-11-06 DIAGNOSIS — M546 Pain in thoracic spine: Secondary | ICD-10-CM | POA: Diagnosis not present

## 2019-11-06 DIAGNOSIS — E785 Hyperlipidemia, unspecified: Secondary | ICD-10-CM | POA: Diagnosis not present

## 2019-11-06 DIAGNOSIS — Z888 Allergy status to other drugs, medicaments and biological substances status: Secondary | ICD-10-CM | POA: Diagnosis not present

## 2019-11-06 HISTORY — PX: BREAST REDUCTION SURGERY: SHX8

## 2019-11-06 LAB — POCT PREGNANCY, URINE: Preg Test, Ur: NEGATIVE

## 2019-11-06 SURGERY — MAMMOPLASTY, REDUCTION
Anesthesia: General | Site: Breast | Laterality: Bilateral

## 2019-11-06 MED ORDER — LACTATED RINGERS IV SOLN: INTRAVENOUS | Status: DC

## 2019-11-06 MED ORDER — CEFAZOLIN SODIUM-DEXTROSE 2-4 GM/100ML-% IV SOLN
2.0000 g | INTRAVENOUS | Status: AC
  Administered 2019-11-06: 2 g via INTRAVENOUS

## 2019-11-06 MED ORDER — NITROGLYCERIN 2 % TD OINT
TOPICAL_OINTMENT | TRANSDERMAL | Status: DC | PRN
  Administered 2019-11-06: 1 [in_us] via TOPICAL

## 2019-11-06 MED ORDER — AMISULPRIDE (ANTIEMETIC) 5 MG/2ML IV SOLN: 10.0000 mg | Freq: Once | INTRAVENOUS | Status: DC | PRN

## 2019-11-06 MED ORDER — LIDOCAINE 2% (20 MG/ML) 5 ML SYRINGE
INTRAMUSCULAR | Status: DC | PRN
  Administered 2019-11-06: 100 mg via INTRAVENOUS

## 2019-11-06 MED ORDER — NITROGLYCERIN 2 % TD OINT
TOPICAL_OINTMENT | TRANSDERMAL | Status: AC
  Filled 2019-11-06: qty 30

## 2019-11-06 MED ORDER — ONDANSETRON HCL 4 MG/2ML IJ SOLN
INTRAMUSCULAR | Status: DC | PRN
  Administered 2019-11-06: 4 mg via INTRAVENOUS

## 2019-11-06 MED ORDER — PROPOFOL 10 MG/ML IV BOLUS
INTRAVENOUS | Status: AC
  Filled 2019-11-06: qty 20

## 2019-11-06 MED ORDER — SUGAMMADEX SODIUM 200 MG/2ML IV SOLN
INTRAVENOUS | Status: DC | PRN
  Administered 2019-11-06: 240 mg via INTRAVENOUS

## 2019-11-06 MED ORDER — DEXAMETHASONE SODIUM PHOSPHATE 10 MG/ML IJ SOLN
INTRAMUSCULAR | Status: AC
  Filled 2019-11-06: qty 1

## 2019-11-06 MED ORDER — LIDOCAINE 2% (20 MG/ML) 5 ML SYRINGE
INTRAMUSCULAR | Status: AC
  Filled 2019-11-06: qty 5

## 2019-11-06 MED ORDER — FENTANYL CITRATE (PF) 100 MCG/2ML IJ SOLN
INTRAMUSCULAR | Status: AC
  Filled 2019-11-06: qty 2

## 2019-11-06 MED ORDER — ONDANSETRON HCL 4 MG/2ML IJ SOLN
INTRAMUSCULAR | Status: AC
  Filled 2019-11-06: qty 2

## 2019-11-06 MED ORDER — LIDOCAINE HCL 1 % IJ SOLN
INTRAVENOUS | Status: DC | PRN
  Administered 2019-11-06: 200 mL

## 2019-11-06 MED ORDER — ROCURONIUM BROMIDE 10 MG/ML (PF) SYRINGE
PREFILLED_SYRINGE | INTRAVENOUS | Status: DC | PRN
  Administered 2019-11-06: 20 mg via INTRAVENOUS
  Administered 2019-11-06: 100 mg via INTRAVENOUS
  Administered 2019-11-06: 30 mg via INTRAVENOUS

## 2019-11-06 MED ORDER — ACETAMINOPHEN 10 MG/ML IV SOLN
INTRAVENOUS | Status: AC
  Filled 2019-11-06: qty 100

## 2019-11-06 MED ORDER — LIDOCAINE-EPINEPHRINE 1 %-1:100000 IJ SOLN
INTRAMUSCULAR | Status: AC
  Filled 2019-11-06: qty 1

## 2019-11-06 MED ORDER — DIPHENHYDRAMINE HCL 50 MG/ML IJ SOLN
INTRAMUSCULAR | Status: DC | PRN
  Administered 2019-11-06: 12.5 mg via INTRAVENOUS

## 2019-11-06 MED ORDER — CEFAZOLIN SODIUM-DEXTROSE 2-4 GM/100ML-% IV SOLN
INTRAVENOUS | Status: AC
  Filled 2019-11-06: qty 100

## 2019-11-06 MED ORDER — FENTANYL CITRATE (PF) 250 MCG/5ML IJ SOLN
INTRAMUSCULAR | Status: DC | PRN
Start: 2019-11-06 — End: 2019-11-06
  Administered 2019-11-06 (×2): 25 ug via INTRAVENOUS
  Administered 2019-11-06: 100 ug via INTRAVENOUS
  Administered 2019-11-06 (×2): 25 ug via INTRAVENOUS

## 2019-11-06 MED ORDER — EPINEPHRINE PF 1 MG/ML IJ SOLN
INTRAMUSCULAR | Status: AC
  Filled 2019-11-06: qty 1

## 2019-11-06 MED ORDER — SODIUM CHLORIDE 0.9 % IV SOLN: 250.0000 mL | INTRAVENOUS | Status: DC | PRN

## 2019-11-06 MED ORDER — PROPOFOL 500 MG/50ML IV EMUL
INTRAVENOUS | Status: DC | PRN
  Administered 2019-11-06: 25 ug/kg/min via INTRAVENOUS

## 2019-11-06 MED ORDER — ACETAMINOPHEN 325 MG PO TABS: 650.0000 mg | ORAL_TABLET | ORAL | Status: DC | PRN

## 2019-11-06 MED ORDER — PROPOFOL 10 MG/ML IV BOLUS
INTRAVENOUS | Status: DC | PRN
  Administered 2019-11-06: 200 mg via INTRAVENOUS

## 2019-11-06 MED ORDER — MORPHINE SULFATE (PF) 4 MG/ML IV SOLN: 2.0000 mg | INTRAVENOUS | Status: DC | PRN

## 2019-11-06 MED ORDER — HYDROMORPHONE HCL 1 MG/ML IJ SOLN
INTRAMUSCULAR | Status: AC
  Filled 2019-11-06: qty 0.5

## 2019-11-06 MED ORDER — MIDAZOLAM HCL 5 MG/5ML IJ SOLN
INTRAMUSCULAR | Status: DC | PRN
  Administered 2019-11-06: 2 mg via INTRAVENOUS

## 2019-11-06 MED ORDER — SODIUM CHLORIDE 0.9% FLUSH: 3.0000 mL | Freq: Two times a day (BID) | INTRAVENOUS | Status: DC

## 2019-11-06 MED ORDER — OXYCODONE HCL 5 MG PO TABS
ORAL_TABLET | ORAL | Status: AC
Start: 2019-11-06 — End: ?
  Filled 2019-11-06: qty 1

## 2019-11-06 MED ORDER — ACETAMINOPHEN 325 MG RE SUPP: 650.0000 mg | RECTAL | Status: DC | PRN

## 2019-11-06 MED ORDER — BUPIVACAINE HCL (PF) 0.25 % IJ SOLN
INTRAMUSCULAR | Status: AC
  Filled 2019-11-06: qty 30

## 2019-11-06 MED ORDER — LIDOCAINE-EPINEPHRINE 1 %-1:100000 IJ SOLN
INTRAMUSCULAR | Status: DC | PRN
  Administered 2019-11-06: 20 mL

## 2019-11-06 MED ORDER — MIDAZOLAM HCL 2 MG/2ML IJ SOLN
INTRAMUSCULAR | Status: AC
  Filled 2019-11-06: qty 2

## 2019-11-06 MED ORDER — DEXMEDETOMIDINE (PRECEDEX) IN NS 20 MCG/5ML (4 MCG/ML) IV SYRINGE
PREFILLED_SYRINGE | INTRAVENOUS | Status: DC | PRN
  Administered 2019-11-06: 8 ug via INTRAVENOUS
  Administered 2019-11-06: 12 ug via INTRAVENOUS

## 2019-11-06 MED ORDER — OXYCODONE HCL 5 MG PO TABS: 5.0000 mg | ORAL_TABLET | ORAL | Status: DC | PRN

## 2019-11-06 MED ORDER — ACETAMINOPHEN 10 MG/ML IV SOLN
1000.0000 mg | Freq: Once | INTRAVENOUS | Status: AC
  Administered 2019-11-06: 1000 mg via INTRAVENOUS

## 2019-11-06 MED ORDER — MEPERIDINE HCL 25 MG/ML IJ SOLN: 6.2500 mg | INTRAMUSCULAR | Status: DC | PRN

## 2019-11-06 MED ORDER — PROMETHAZINE HCL 25 MG/ML IJ SOLN: 6.2500 mg | INTRAMUSCULAR | Status: DC | PRN

## 2019-11-06 MED ORDER — OXYCODONE HCL 5 MG/5ML PO SOLN: 5.0000 mg | Freq: Once | ORAL | Status: AC | PRN

## 2019-11-06 MED ORDER — OXYCODONE HCL 5 MG PO TABS
5.0000 mg | ORAL_TABLET | Freq: Once | ORAL | Status: AC | PRN
  Administered 2019-11-06: 5 mg via ORAL

## 2019-11-06 MED ORDER — CHLORHEXIDINE GLUCONATE CLOTH 2 % EX PADS: 6.0000 | MEDICATED_PAD | Freq: Once | CUTANEOUS | Status: DC

## 2019-11-06 MED ORDER — DEXAMETHASONE SODIUM PHOSPHATE 10 MG/ML IJ SOLN
INTRAMUSCULAR | Status: DC | PRN
  Administered 2019-11-06: 10 mg via INTRAVENOUS

## 2019-11-06 MED ORDER — HYDROMORPHONE HCL 1 MG/ML IJ SOLN
0.2500 mg | INTRAMUSCULAR | Status: DC | PRN
  Administered 2019-11-06 (×3): 0.5 mg via INTRAVENOUS

## 2019-11-06 MED ORDER — SODIUM CHLORIDE 0.9% FLUSH: 3.0000 mL | INTRAVENOUS | Status: DC | PRN

## 2019-11-06 MED ORDER — LIDOCAINE HCL (PF) 1 % IJ SOLN
INTRAMUSCULAR | Status: AC
  Filled 2019-11-06: qty 60

## 2019-11-06 SURGICAL SUPPLY — 71 items
BAG DECANTER FOR FLEXI CONT (MISCELLANEOUS) IMPLANT
BINDER BREAST 3XL (GAUZE/BANDAGES/DRESSINGS) ×2 IMPLANT
BINDER BREAST LRG (GAUZE/BANDAGES/DRESSINGS) IMPLANT
BINDER BREAST MEDIUM (GAUZE/BANDAGES/DRESSINGS) IMPLANT
BINDER BREAST XLRG (GAUZE/BANDAGES/DRESSINGS) IMPLANT
BINDER BREAST XXLRG (GAUZE/BANDAGES/DRESSINGS) IMPLANT
BIOPATCH RED 1 DISK 7.0 (GAUZE/BANDAGES/DRESSINGS) IMPLANT
BLADE HEX COATED 2.75 (ELECTRODE) IMPLANT
BLADE KNIFE PERSONA 10 (BLADE) ×4 IMPLANT
BLADE SURG 15 STRL LF DISP TIS (BLADE) ×1 IMPLANT
BLADE SURG 15 STRL SS (BLADE) ×1
BNDG GAUZE ELAST 4 BULKY (GAUZE/BANDAGES/DRESSINGS) IMPLANT
CANISTER SUCT 1200ML W/VALVE (MISCELLANEOUS) ×2 IMPLANT
COVER BACK TABLE 60X90IN (DRAPES) ×2 IMPLANT
COVER MAYO STAND STRL (DRAPES) ×2 IMPLANT
COVER WAND RF STERILE (DRAPES) IMPLANT
DECANTER SPIKE VIAL GLASS SM (MISCELLANEOUS) IMPLANT
DERMABOND ADVANCED (GAUZE/BANDAGES/DRESSINGS) ×4
DERMABOND ADVANCED .7 DNX12 (GAUZE/BANDAGES/DRESSINGS) ×4 IMPLANT
DRAIN CHANNEL 19F RND (DRAIN) IMPLANT
DRAPE LAPAROSCOPIC ABDOMINAL (DRAPES) ×2 IMPLANT
DRSG OPSITE POSTOP 4X10 (GAUZE/BANDAGES/DRESSINGS) ×2 IMPLANT
DRSG OPSITE POSTOP 4X6 (GAUZE/BANDAGES/DRESSINGS) ×4 IMPLANT
DRSG PAD ABDOMINAL 8X10 ST (GAUZE/BANDAGES/DRESSINGS) ×8 IMPLANT
ELECT BLADE 4.0 EZ CLEAN MEGAD (MISCELLANEOUS)
ELECT REM PT RETURN 9FT ADLT (ELECTROSURGICAL) ×2
ELECTRODE BLDE 4.0 EZ CLN MEGD (MISCELLANEOUS) IMPLANT
ELECTRODE REM PT RTRN 9FT ADLT (ELECTROSURGICAL) ×1 IMPLANT
EVACUATOR SILICONE 100CC (DRAIN) IMPLANT
GLOVE BIO SURGEON STRL SZ 6.5 (GLOVE) ×6 IMPLANT
GLOVE BIOGEL M 6.5 STRL (GLOVE) ×2 IMPLANT
GLOVE BIOGEL PI IND STRL 7.0 (GLOVE) ×1 IMPLANT
GLOVE BIOGEL PI INDICATOR 7.0 (GLOVE) ×1
GOWN STRL REUS W/ TWL LRG LVL3 (GOWN DISPOSABLE) ×3 IMPLANT
GOWN STRL REUS W/TWL LRG LVL3 (GOWN DISPOSABLE) ×3
MARKER SKIN DUAL TIP RULER LAB (MISCELLANEOUS) ×2 IMPLANT
NDL SAFETY ECLIPSE 18X1.5 (NEEDLE) ×1 IMPLANT
NEEDLE HYPO 18GX1.5 SHARP (NEEDLE) ×1
NEEDLE HYPO 25X1 1.5 SAFETY (NEEDLE) ×2 IMPLANT
NS IRRIG 1000ML POUR BTL (IV SOLUTION) ×2 IMPLANT
PACK BASIN DAY SURGERY FS (CUSTOM PROCEDURE TRAY) ×2 IMPLANT
PAD ALCOHOL SWAB (MISCELLANEOUS) IMPLANT
PAD FOAM SILICONE BACKED (GAUZE/BANDAGES/DRESSINGS) IMPLANT
PENCIL SMOKE EVACUATOR (MISCELLANEOUS) ×2 IMPLANT
SLEEVE SCD COMPRESS KNEE MED (MISCELLANEOUS) ×2 IMPLANT
SPONGE LAP 18X18 RF (DISPOSABLE) ×6 IMPLANT
STRIP SUTURE WOUND CLOSURE 1/2 (MISCELLANEOUS) ×6 IMPLANT
SUT MNCRL AB 4-0 PS2 18 (SUTURE) ×20 IMPLANT
SUT MON AB 3-0 SH 27 (SUTURE) ×2
SUT MON AB 3-0 SH27 (SUTURE) ×2 IMPLANT
SUT MON AB 5-0 PS2 18 (SUTURE) ×8 IMPLANT
SUT PDS 3-0 CT2 (SUTURE)
SUT PDS AB 2-0 CT2 27 (SUTURE) IMPLANT
SUT PDS II 3-0 CT2 27 ABS (SUTURE) IMPLANT
SUT SILK 3 0 PS 1 (SUTURE) IMPLANT
SUT VIC AB 3-0 SH 27 (SUTURE) ×3
SUT VIC AB 3-0 SH 27X BRD (SUTURE) ×3 IMPLANT
SUT VICRYL 4-0 PS2 18IN ABS (SUTURE) IMPLANT
SYR 3ML 23GX1 SAFETY (SYRINGE) ×2 IMPLANT
SYR 50ML LL SCALE MARK (SYRINGE) ×2 IMPLANT
SYR BULB EAR ULCER 2OZ BL STRL (SYRINGE) ×2 IMPLANT
SYR BULB IRRIG 60ML STRL (SYRINGE) IMPLANT
SYR CONTROL 10ML LL (SYRINGE) ×2 IMPLANT
TAPE MEASURE VINYL STERILE (MISCELLANEOUS) IMPLANT
TOWEL GREEN STERILE FF (TOWEL DISPOSABLE) ×4 IMPLANT
TRAY DSU PREP LF (CUSTOM PROCEDURE TRAY) ×2 IMPLANT
TUBE CONNECTING 20X1/4 (TUBING) ×2 IMPLANT
TUBING INFILTRATION IT-10001 (TUBING) ×2 IMPLANT
TUBING SET GRADUATE ASPIR 12FT (MISCELLANEOUS) IMPLANT
UNDERPAD 30X36 HEAVY ABSORB (UNDERPADS AND DIAPERS) ×4 IMPLANT
YANKAUER SUCT BULB TIP NO VENT (SUCTIONS) ×2 IMPLANT

## 2019-11-06 NOTE — Transfer of Care (Signed)
Immediate Anesthesia Transfer of Care Note  Patient: Sandra Castaneda  Procedure(s) Performed: BILATERAL MAMMARY REDUCTION  (BREAST) (Bilateral Breast)  Patient Location: PACU  Anesthesia Type:General  Level of Consciousness: drowsy  Airway & Oxygen Therapy: Patient Spontanous Breathing  Post-op Assessment: Report given to RN and Post -op Vital signs reviewed and stable  Post vital signs: Reviewed and stable  Last Vitals:  Vitals Value Taken Time  BP 123/76 11/06/19 1445  Temp 37 C 11/06/19 1443  Pulse 88 11/06/19 1445  Resp 16 11/06/19 1445  SpO2 100 % 11/06/19 1445  Vitals shown include unvalidated device data.  Last Pain:  Vitals:   11/06/19 1044  TempSrc: Oral  PainSc: 0-No pain         Complications: No complications documented.

## 2019-11-06 NOTE — Anesthesia Postprocedure Evaluation (Signed)
Anesthesia Post Note  Patient: Sandra Castaneda  Procedure(s) Performed: BILATERAL MAMMARY REDUCTION  (BREAST) (Bilateral Breast)     Patient location during evaluation: PACU Anesthesia Type: General Level of consciousness: awake and alert Pain management: pain level controlled Vital Signs Assessment: post-procedure vital signs reviewed and stable Respiratory status: spontaneous breathing, nonlabored ventilation and respiratory function stable Cardiovascular status: blood pressure returned to baseline and stable Postop Assessment: no apparent nausea or vomiting Anesthetic complications: no   No complications documented.  Last Vitals:  Vitals:   11/06/19 1545 11/06/19 1609  BP: 107/63 109/66  Pulse: 90 88  Resp: 17 18  Temp:  36.7 C  SpO2: 92% 98%    Last Pain:  Vitals:   11/06/19 1600  TempSrc:   PainSc: 6                  [name]

## 2019-11-06 NOTE — Anesthesia Preprocedure Evaluation (Signed)
Anesthesia Evaluation  Patient identified by MRN, date of birth, ID band Patient awake    Reviewed: Allergy & Precautions, H&P , NPO status , Patient's Chart, lab work & pertinent test results  History of Anesthesia Complications Negative for: history of anesthetic complications  Airway Mallampati: III  TM Distance: >3 FB Neck ROM: full    Dental  (+) Poor Dentition, Chipped   Pulmonary neg shortness of breath, former smoker,    Pulmonary exam normal breath sounds clear to auscultation       Cardiovascular Exercise Tolerance: Good (-) hypertension(-) angina(-) DOE negative cardio ROS Normal cardiovascular exam Rhythm:regular Rate:Normal     Neuro/Psych Anxiety Depression    GI/Hepatic GERD  Controlled,  Endo/Other    Renal/GU   negative genitourinary   Musculoskeletal   Abdominal (+) + obese,   Peds  Hematology negative hematology ROS (+)   Anesthesia Other Findings Past Medical History: 2007: Ovarian cyst  Past Surgical History: 2008: CESAREAN SECTION     Comment: FTP 2007: LAPAROTOMY  BMI    Body Mass Index:  36.16 kg/m      Reproductive/Obstetrics                             Anesthesia Physical  Anesthesia Plan  ASA: II  Anesthesia Plan: General   Post-op Pain Management:    Induction: Intravenous  PONV Risk Score and Plan: 3 and Ondansetron, Dexamethasone, Midazolam and Treatment may vary due to age or medical condition  Airway Management Planned: Oral ETT  Additional Equipment:   Intra-op Plan:   Post-operative Plan: Extubation in OR  Informed Consent: I have reviewed the patients History and Physical, chart, labs and discussed the procedure including the risks, benefits and alternatives for the proposed anesthesia with the patient or authorized representative who has indicated his/her understanding and acceptance.     Dental Advisory Given  Plan  Discussed with: Anesthesiologist  Anesthesia Plan Comments:         Anesthesia Quick Evaluation

## 2019-11-06 NOTE — Anesthesia Procedure Notes (Signed)
Procedure Name: Intubation Performed by: Ezekiel Ina, CRNA Pre-anesthesia Checklist: Patient identified, Emergency Drugs available, Suction available and Patient being monitored Patient Re-evaluated:Patient Re-evaluated prior to induction Oxygen Delivery Method: Circle System Utilized Preoxygenation: Pre-oxygenation with 100% oxygen Induction Type: IV induction Ventilation: Mask ventilation without difficulty Laryngoscope Size: Miller and 3 Grade View: Grade II Tube type: Oral Tube size: 7.0 mm Number of attempts: 1 Airway Equipment and Method: Stylet and Oral airway Placement Confirmation: ETT inserted through vocal cords under direct vision,  positive ETCO2 and breath sounds checked- equal and bilateral Secured at: 22 cm Tube secured with: Tape Dental Injury: Teeth and Oropharynx as per pre-operative assessment

## 2019-11-06 NOTE — Discharge Instructions (Addendum)
INSTRUCTIONS FOR AFTER SURGERY   You will likely have some questions about what to expect following your operation.  The following information will help you and your family understand what to expect when you are discharged from the hospital.  Following these guidelines will help ensure a smooth recovery and reduce risks of complications.  Postoperative instructions include information on: diet, wound care, medications and physical activity.  AFTER SURGERY Expect to go home after the procedure.  In some cases, you may need to spend one night in the hospital for observation.  DIET This surgery does not require a specific diet.  However, I have to mention that the healthier you eat the better your body can start healing. It is important to increasing your protein intake.  This means limiting the foods with added sugar.  Focus on fruits and vegetables and some meat. It is very important to drink water after your surgery.  If your urine is bright yellow, then it is concentrated, and you need to drink more water.  As a general rule after surgery, you should have 8 ounces of water every hour while awake.  If you find you are persistently nauseated or unable to take in liquids let us know.  NO TOBACCO USE or EXPOSURE.  This will slow your healing process and increase the risk of a wound.  WOUND CARE If you don't have a drain: You can shower the day after surgery.  Use fragrance free soap.  Dial, [place], [place] and Cetaphil are usually mild on the skin.  If you have steri-strips / tape directly attached to your skin leave them in place. It is OK to get these wet.  No baths, pools or hot tubs for two weeks. We close your incision to leave the smallest and best-looking scar. No ointment or creams on your incisions until given the go ahead.  Especially not Neosporin (Too many skin reactions with this one).  A few weeks after surgery you can use Mederma and start massaging the scar. We ask you to wear your binder or  sports bra for the first 6 weeks around the clock, including while sleeping. This provides added comfort and helps reduce the fluid accumulation at the surgery site.  ACTIVITY No heavy lifting until cleared by the doctor.  It is OK to walk and climb stairs. In fact, moving your legs is very important to decrease your risk of a blood clot.  It will also help keep you from getting deconditioned.  Every 1 to 2 hours get up and walk for 5 minutes. This will help with a quicker recovery back to normal.  Let pain be your guide so you don't do too much.  NO, you cannot do the spring cleaning and don't plan on taking care of anyone else.  This is your time for TLC.   WORK Everyone returns to work at different times. As a rough guide, most people take at least 1 - 2 weeks off prior to returning to work. If you need documentation for your job, bring the forms to your postoperative follow up visit.  DRIVING Arrange for someone to bring you home from the hospital.  You may be able to drive a few days after surgery but not while taking any narcotics or valium.  BOWEL MOVEMENTS Constipation can occur after anesthesia and while taking pain medication.  It is important to stay ahead for your comfort.  We recommend taking Milk of Magnesia (2 tablespoons; twice a day) while taking  the pain pills.  SEROMA This is fluid your body tried to put in the surgical site.  This is normal but if it creates excessive pain and swelling let us know.  It usually decreases in a few weeks.  MEDICATIONS and PAIN CONTROL At your preoperative visit for you history and physical you were given the following medications: 1. An antibiotic: Start this medication when you get home and take according to the instructions on the bottle. 2. Zofran 4 mg:  This is to treat nausea and vomiting.  You can take this every 6 hours as needed and only if needed. 3. Norco (hydrocodone/acetaminophen) 5/325 mg:  This is only to be used after you have  taken the motrin or the tylenol. Every 8 hours as needed. Over the counter Medication to take: 4. Ibuprofen (Motrin) 600 mg:  Take this every 6 hours.  If you have additional pain then take 500 mg of the tylenol.  Only take the Norco after you have tried these two. 5. Miralax or stool softener of choice: Take this according to the bottle if you take the Norco. 5.   Nitro paste to each areola every 12 hours for 2 days  WHEN TO CALL Call your surgeon's office if any of the following occur: . Fever 101 degrees F or greater . Excessive bleeding or fluid from the incision site. . Pain that increases over time without aid from the medications . Redness, warmth, or pus draining from incision sites . Persistent nausea or inability to take in liquids . Severe misshapen area that underwent the operation.   Post Anesthesia Home Care Instructions  Activity: Get plenty of rest for the remainder of the day. A responsible individual must stay with you for 24 hours following the procedure.  For the next 24 hours, DO NOT: -Drive a car -Advertising copywriter -Drink alcoholic beverages -Take any medication unless instructed by your physician -Make any legal decisions or sign important papers.  Meals: Start with liquid foods such as gelatin or soup. Progress to regular foods as tolerated. Avoid greasy, spicy, heavy foods. If nausea and/or vomiting occur, drink only clear liquids until the nausea and/or vomiting subsides. Call your physician if vomiting continues.  Special Instructions/Symptoms: Your throat may feel dry or sore from the anesthesia or the breathing tube placed in your throat during surgery. If this causes discomfort, gargle with warm salt water. The discomfort should disappear within 24 hours.  If you had a scopolamine patch placed behind your ear for the management of post- operative nausea and/or vomiting:  1. The medication in the patch is effective for 72 hours, after which it should be  removed.  Wrap patch in a tissue and discard in the trash. Wash hands thoroughly with soap and water. 2. You may remove the patch earlier than 72 hours if you experience unpleasant side effects which may include dry mouth, dizziness or visual disturbances. 3. Avoid touching the patch. Wash your hands with soap and water after contact with the patch.   No Tylenol until 9:45 pm. Do not take Norco until 12:10 am if needed.

## 2019-11-06 NOTE — H&P (Signed)
Sandra Castaneda is an 34 y.o. female.   Chief Complaint: Mammary hypertrophy HPI: The patient is a 34 year old female here for treatment for mammary hypertrophy.  She has very large breasts and complains of neck and back pain.  She has tried to alleviate her pain by pulling up on the straps.  She has improving at her shoulders.  She is fairly symmetric and has hyperpigmentation at the inframammary folds on both breasts.  She is 5 feet 10 inches tall and weighs 252 pounds.  Her preoperative bra size is a 42 FF.  The estimated amount of tissue to be removed 1000 g from each breast.  Her mammogram in May was negative.  She was instructed that she must remain tobacco free 6 weeks prior to the surgery.  Past Medical History:  Diagnosis Date  . Anemia   . Anxiety and depression    lexapro 10 mg no help, zoloft like zombie  . BRCA negative    per pt report, done through [facility]  . Chronic low back pain    11/05/2018 MRI L spine 3. 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 COVID-19    09/11/19  . COVID-19    09/11/19  . Heavy menses   . Increased risk of breast cancer    IBIS=25.3%  . Large breasts   . Ovarian cyst 2007    Past Surgical History:  Procedure Laterality Date  . CESAREAN SECTION  2008   FTP  . CESAREAN SECTION WITH BILATERAL TUBAL LIGATION N/A 02/04/2016   Procedure: CESAREAN SECTION WITH BILATERAL TUBAL LIGATION;  Surgeon: Will Bonnet, MD;  Location: [facility] ORS;  Service: Obstetrics;  Laterality: N/A;  . LAPAROTOMY  2007   ruptured ovarian cyst Dr. Ammie Dalton     Family History  Problem Relation Age of Onset  . Brain cancer Mother   . Hypertension Mother   . Ovarian cancer Mother 16  . Lung cancer Mother   . Cancer Mother        ovarian to lung to brain  . Headache Mother   . Diabetes Mellitus I Father   . Pancreatic cancer Father 64  . Cancer Father        stomach cancer  . Diabetes Father   . Hypertension Brother   . Breast cancer  Maternal Aunt 31  . Diabetes Mellitus II Paternal Aunt   . Diabetes Mellitus II Paternal Grandmother   . Breast cancer Maternal Aunt 38  . Anxiety disorder Brother    Social History:  reports that she quit smoking about 2 months ago. She smoked 0.25 packs per day. She has never used smokeless tobacco. She reports that she does not drink alcohol and does not use drugs.  Allergies:  Allergies  Allergen Reactions  . Chantix [Varenicline]     Crying   . Lexapro [Escitalopram]     No help   . Zoloft [Sertraline]     Zombie     Medications Prior to Admission  Medication Sig Dispense Refill  . acetaminophen (TYLENOL) 500 MG tablet Take 1 tablet (500 mg total) by mouth every 6 (six) hours as needed. For use AFTER surgery 30 tablet 0  . albuterol (VENTOLIN HFA) 108 (90 Base) MCG/ACT inhaler Inhale 1-2 puffs into the lungs every 4 (four) hours as needed for wheezing or shortness of breath. 18 g 0  . Cholecalciferol 1.25 MG (50000 UT) capsule Take 1 capsule (50,000 Units total) by mouth once a week. Seth Ward  capsule 2  . cyclobenzaprine (FLEXERIL) 5 MG tablet Take 1 tablet (5 mg total) by mouth at bedtime as needed for muscle spasms. 30 tablet 0    Results for orders placed or performed during the hospital encounter of 11/06/19 (from the past 48 hour(s))  Pregnancy, urine POC     Status: None   Collection Time: 11/06/19 10:31 AM  Result Value Ref Range   Preg Test, Ur NEGATIVE NEGATIVE    Comment:        THE SENSITIVITY OF THIS METHODOLOGY IS >24 mIU/mL    No results found.  Review of Systems  Constitutional: Negative.   HENT: Negative.   Eyes: Negative.   Respiratory: Negative.   Cardiovascular: Negative.   Gastrointestinal: Negative.   Endocrine: Negative.   Genitourinary: Negative.   Musculoskeletal: Positive for back pain and neck pain.  Neurological: Negative.   Hematological: Negative.   Psychiatric/Behavioral: Negative.     Height _0  (1.803 m), weight 113.4 kg, last  menstrual period 10/28/2019. Physical Exam Neurological:     Mental Status: She is alert.      Assessment/Plan Mammary Hypertrophy: Risks and complications were explained to the patient including but not limited to bleeding, pain, scar and risk of anesthesia.  She is aware that there will be a change in her nipple areola sensation.  There also will be a possible inability to breast-feed in the future.  Asymmetry is certainly possible and modifications can be made in the future.  The patient acknowledges and agrees with the plan.  She has stopped smoking and has a very low risk of DVT based on her Caprini score.  Mammogram results reviewed and noted in the chart. [place], DO 11/06/2019, 10:44 AM

## 2019-11-06 NOTE — Op Note (Signed)
Breast Reduction Op note:    DATE OF PROCEDURE: 11/06/2019  LOCATION: [facility]  SURGEON: Alan Ripper Sanger Chantrell Apsey, DO  ASSISTANT: Enedina Finner, RNFA  PREOPERATIVE DIAGNOSIS 1. Macromastia 2. Neck Pain 3. Back Pain  POSTOPERATIVE DIAGNOSIS 1. Macromastia 2. Neck Pain 3. Back Pain  PROCEDURES 1. Bilateral breast reduction.  Right reduction 1635 g, Left reduction 1462 g  COMPLICATIONS: None.  DRAINS: none  INDICATIONS FOR PROCEDURE Sandra Castaneda is a 34 y.o. year-old female born on 1985-07-29,with a history of symptomatic macromastia with concominant back pain, neck pain, shoulder grooving from her bra.   MRN: [id number]  CONSENT Informed consent was obtained directly from the patient. The risks, benefits and alternatives were fully discussed. Specific risks including but not limited to bleeding, infection, hematoma, seroma, scarring, pain, nipple necrosis, asymmetry, poor cosmetic results, and need for further surgery were discussed. The patient had ample opportunity to have her questions answered to her satisfaction.  DESCRIPTION OF PROCEDURE  Patient was brought into the operating room and placed in a supine position.  SCDs were placed and appropriate padding was performed.  Antibiotics were given. The patient underwent general anesthesia and the chest was prepped and draped in a sterile fashion.  A timeout was performed and all information was confirmed to be correct. A small amount of tumescent was placed in the inferior aspect of the breast to assit with blood control.  Right side: Preoperative markings were confirmed.  Incision lines were injected with 1% Xylocaine with epinephrine.  After waiting for vasoconstriction, the marked lines were incised.  A Wise-pattern superomedial breast reduction was performed by de-epithelializing the pedicle, using bovie to create the superomedial pedicle, and removing breast tissue from the lateral and inferior  portions of the breast.  Care was taken to not undermine the breast pedicle. Hemostasis was achieved.  The nipple was gently rotated into position and the soft tissue closed with 4-0 Monocryl.   The pocket was irrigated and hemostasis confirmed.  The deep tissues were approximated with 3-0 Monocryl sutures and the skin was closed with deep dermal and subcuticular 4-0 Monocryl sutures.  The nipple and skin flaps had good capillary refill at the end of the procedure.    Left side: Preoperative markings were confirmed.  Incision lines were injected with 1% Xylocaine with epinephrine.  After waiting for vasoconstriction, the marked lines were incised.  A Wise-pattern superomedial breast reduction was performed by de-epithelializing the pedicle, using bovie to create the superomedial pedicle, and removing breast tissue from the lateral and inferior portions of the breast.  Care was taken to not undermine the breast pedicle. Hemostasis was achieved.  The nipple was gently rotated into position and the soft tissue was closed with 4-0 Monocryl.  The patient was sat upright and size and shape symmetry was confirmed.  The pocket was irrigated and hemostasis confirmed.  The deep tissues were approximated with 3-0 Monocryl sutures and the skin was closed with deep dermal and subcuticular 4-0 Monocryl sutures.  Dermabond was applied.  A breast binder and ABDs were placed.  The nipple and skin flaps had good capillary refill at the end of the procedure.  The patient tolerated the procedure well. The patient was allowed to wake from anesthesia and taken to the recovery room in satisfactory condition.  The RNFA assisted throughout the case.  The RNFA was essential in retraction and counter traction when needed to make the case progress smoothly.  This retraction and assistance made it possible  to see the tissue plans for the procedure.  The assistance was needed for blood control, tissue re-approximation and assisted with  closure of the incision site.

## 2019-11-07 ENCOUNTER — Encounter (HOSPITAL_BASED_OUTPATIENT_CLINIC_OR_DEPARTMENT_OTHER): Payer: Self-pay | Admitting: Plastic Surgery

## 2019-11-08 LAB — SURGICAL PATHOLOGY

## 2019-11-14 ENCOUNTER — Encounter: Payer: Self-pay | Admitting: Plastic Surgery

## 2019-11-14 ENCOUNTER — Other Ambulatory Visit: Payer: Self-pay

## 2019-11-14 ENCOUNTER — Ambulatory Visit (INDEPENDENT_AMBULATORY_CARE_PROVIDER_SITE_OTHER): Payer: 59 | Admitting: Plastic Surgery

## 2019-11-14 VITALS — HR 100 | Temp 98.7°F

## 2019-11-14 DIAGNOSIS — N62 Hypertrophy of breast: Secondary | ICD-10-CM

## 2019-11-14 NOTE — Progress Notes (Signed)
The patient is a 34 year old female here for follow-up after undergoing bilateral breast reduction on October 4.  She had over 1600 g removed from the right breast and 1400 g from the left breast.  The incisions are healing well.  I am going to leave the honeycomb dressing in place.  We will probably remove it at her next visit.  It does not appear she has a hematoma or seroma.  There is no sign of infection.  She is very pleased with the results so far.

## 2019-11-15 ENCOUNTER — Ambulatory Visit: Payer: 59 | Attending: Neurology | Admitting: Physical Therapy

## 2019-11-19 ENCOUNTER — Encounter: Payer: Self-pay | Admitting: Plastic Surgery

## 2019-11-28 ENCOUNTER — Encounter: Payer: Self-pay | Admitting: Surgical

## 2019-11-28 ENCOUNTER — Other Ambulatory Visit: Payer: Self-pay

## 2019-11-28 ENCOUNTER — Ambulatory Visit (INDEPENDENT_AMBULATORY_CARE_PROVIDER_SITE_OTHER): Payer: 59 | Admitting: Surgical

## 2019-11-28 VITALS — BP 118/74 | HR 109 | Temp 99.2°F

## 2019-11-28 DIAGNOSIS — Z9889 Other specified postprocedural states: Secondary | ICD-10-CM

## 2019-11-28 NOTE — Progress Notes (Signed)
No show, rescheduled.

## 2019-11-28 NOTE — Progress Notes (Signed)
Patient is a 34 year old female here for follow-up after bilateral breast reduction with Dr. Ulice Bold on 11/06/2019.  Patient had 1635 g removed from the right breast and 1462 g removed from the left breast.  She is 3 weeks postop.  Patient is overall doing well today, she is very pleased with how things are going.  She reports that she has some left and right lateral breast pain but is otherwise doing well.  Chaperone present on exam On exam bilateral breast incisions are intact, bilateral NAC's are viable with good capillary refill and color.  She has some mild swelling bilaterally, right greater than left but no fluid wave on exam.  She does have some small pinpoint wound noted at the junction of the vertical limb and inframammary fold on bilateral breast.  She also has a wound at 7:00 on the right NAC.  There is no cellulitic changes or erythema noted.  No foul odor is noted.  Recommend Vaseline and gauze daily to bilateral inframammary fold wounds and right NAC wound.  I discussed with the patient that these should heal up in the next few weeks without any concern.  I recommend she call with any questions or concerns prior to her follow-up in 3 weeks.  I recommend she continue to wear compressive garment and avoid strenuous activity.

## 2019-12-22 ENCOUNTER — Other Ambulatory Visit: Payer: Self-pay

## 2019-12-22 ENCOUNTER — Ambulatory Visit (INDEPENDENT_AMBULATORY_CARE_PROVIDER_SITE_OTHER): Payer: 59 | Admitting: Surgical

## 2019-12-22 ENCOUNTER — Encounter: Payer: Self-pay | Admitting: Surgical

## 2019-12-22 VITALS — BP 126/78 | HR 81 | Temp 98.4°F

## 2019-12-22 DIAGNOSIS — Z9889 Other specified postprocedural states: Secondary | ICD-10-CM

## 2019-12-22 NOTE — Progress Notes (Signed)
Patient is a 34 year old female here for follow-up after bilateral breast reduction with Dr. Ulice Bold on 11/06/2019.  She reports that overall she is doing well.  Continues to have a little bit of tenderness.  She also reports that she has an area on the right medial breast that bothers her.  She also has noticed a few areas of lumps within her right breast.  Chaperone present on exam Bilateral breast incisions intact, bilateral inframammary fold wounds have healed.  Right NAC wound has healed.  No erythema or cellulitic changes.  No drainage noted.  Small dogear noted along the right medial breast.  On exam right breast with a few areas of fat necrosis noted.  Discussed with patient to continue wearing compressive garment throughout the day, avoid wearing a normal bra without for 3 months postop.  We discussed scheduling an additional follow-up in 6 months for reevaluation.  At that time we can reevaluate the right breast dogear.  I recommend she call with any questions or concerns.  No more lifting restrictions.  Slowly increase activity as able.   Discussed with patient the areas of firmness/lumps that she notices is likely fat necrosis and is common after breast reduction surgery.  Pictures were obtained of the patient and placed in the chart with the patient's or guardian's permission.

## 2020-01-12 ENCOUNTER — Telehealth: Payer: Self-pay | Admitting: Internal Medicine

## 2020-01-12 NOTE — Telephone Encounter (Signed)
Received a fax with blank FMLA paperwork for the Patient. Calling to see what this was for as this was completed 10/20/19.   Patient states that she was out for COVID but is still having the migraines. States her job did not approve the FMLA to cover the neurology appointments we referred her to.   Patient states she will need new paperwork for intermittent leave to cover her going to her neurology appointments.   Please advise, paperwork placed on your desk.

## 2020-01-17 ENCOUNTER — Encounter: Payer: Self-pay | Admitting: Internal Medicine

## 2020-01-18 ENCOUNTER — Ambulatory Visit: Payer: 59 | Admitting: Internal Medicine

## 2020-01-18 ENCOUNTER — Other Ambulatory Visit: Payer: Self-pay

## 2020-01-18 ENCOUNTER — Encounter: Payer: Self-pay | Admitting: Internal Medicine

## 2020-01-18 VITALS — BP 126/78 | HR 88 | Temp 98.0°F | Ht 71.0 in | Wt 258.4 lb

## 2020-01-18 DIAGNOSIS — F339 Major depressive disorder, recurrent, unspecified: Secondary | ICD-10-CM

## 2020-01-18 DIAGNOSIS — F32A Depression, unspecified: Secondary | ICD-10-CM

## 2020-01-18 DIAGNOSIS — G8929 Other chronic pain: Secondary | ICD-10-CM | POA: Diagnosis not present

## 2020-01-18 DIAGNOSIS — R4189 Other symptoms and signs involving cognitive functions and awareness: Secondary | ICD-10-CM

## 2020-01-18 DIAGNOSIS — G43911 Migraine, unspecified, intractable, with status migrainosus: Secondary | ICD-10-CM | POA: Diagnosis not present

## 2020-01-18 DIAGNOSIS — R110 Nausea: Secondary | ICD-10-CM | POA: Diagnosis not present

## 2020-01-18 DIAGNOSIS — F439 Reaction to severe stress, unspecified: Secondary | ICD-10-CM | POA: Diagnosis not present

## 2020-01-18 DIAGNOSIS — R519 Headache, unspecified: Secondary | ICD-10-CM

## 2020-01-18 DIAGNOSIS — F419 Anxiety disorder, unspecified: Secondary | ICD-10-CM

## 2020-01-18 MED ORDER — VENLAFAXINE HCL ER 37.5 MG PO CP24: 37.5000 mg | ORAL_CAPSULE | Freq: Every day | ORAL | 3 refills | Status: DC

## 2020-01-18 MED ORDER — ONDANSETRON HCL 4 MG PO TABS: 4.0000 mg | ORAL_TABLET | Freq: Two times a day (BID) | ORAL | 2 refills | Status: DC | PRN

## 2020-01-18 MED ORDER — ALPRAZOLAM 0.25 MG PO TABS: 0.1250 mg | ORAL_TABLET | Freq: Every day | ORAL | 0 refills | Status: DC | PRN

## 2020-01-18 MED ORDER — RIZATRIPTAN BENZOATE 10 MG PO TABS: 10.0000 mg | ORAL_TABLET | ORAL | 11 refills | Status: DC | PRN

## 2020-01-18 NOTE — Progress Notes (Signed)
Chief Complaint  Patient presents with   Follow-up   F/u  1. Anxiety/depression PHQ 9 19 and GAD 7 20  2. S/p covid 19 with brain fog and and migraines 7/10 injections did not help she is having chronic h/a and having stress and nausea  MRI 10/24/19 stress h/a, stress due to work, personal, and family    Review of Systems  Constitutional: Negative for weight loss.  HENT: Negative for hearing loss.   Eyes: Negative for blurred vision.  Respiratory: Negative for shortness of breath.   Cardiovascular: Negative for chest pain.  Skin: Negative for rash.  Neurological: Positive for headaches.  Psychiatric/Behavioral: Positive for depression and memory loss. The patient is nervous/anxious.    Past Medical History:  Diagnosis Date   Anemia    Anxiety and depression    lexapro 10 mg no help, zoloft like zombie   BRCA negative    per pt report, done through [facility]   Chronic low back pain    11/05/2018 MRI L spine 3. 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   COVID-19    09/11/19   COVID-19    09/11/19   Heavy menses    Increased risk of breast cancer    IBIS=25.3%   Large breasts    Ovarian cyst 2007   Past Surgical History:  Procedure Laterality Date   BREAST REDUCTION SURGERY Bilateral 11/06/2019   Procedure: BILATERAL MAMMARY REDUCTION  (BREAST);  Surgeon: Wallace Going, DO;  Location: [facility];  Service: Plastics;  Laterality: Bilateral;   CESAREAN SECTION  2008   FTP   CESAREAN SECTION WITH BILATERAL TUBAL LIGATION N/A 02/04/2016   Procedure: CESAREAN SECTION WITH BILATERAL TUBAL LIGATION;  Surgeon: Will Bonnet, MD;  Location: [facility];  Service: Obstetrics;  Laterality: N/A;   LAPAROTOMY  2007   ruptured ovarian cyst Dr. Ammie Dalton    Family History  Problem Relation Age of Onset   Brain cancer Mother    Hypertension Mother    Ovarian cancer Mother 3   Lung cancer Mother    Cancer Mother        ovarian  to lung to brain   Headache Mother    Diabetes Mellitus I Father    Pancreatic cancer Father 20   Cancer Father        stomach cancer   Diabetes Father    Hypertension Brother    Breast cancer Maternal Aunt 75   Diabetes Mellitus II Paternal Aunt    Diabetes Mellitus II Paternal Grandmother    Breast cancer Maternal Aunt 58   Anxiety disorder Brother    Social History   Socioeconomic History   Marital status: Single    Spouse name: Not on file   Number of children: 2   Years of education: Not on file   Highest education level: Not on file  Occupational History   Not on file  Tobacco Use   Smoking status: Current Some Day Smoker    Packs/day: 0.25    Types: Cigarettes    Last attempt to quit: 09/2019    Years since quitting: 0.3   Smokeless tobacco: Never Used  Vaping Use   Vaping Use: Never used  Substance and Sexual Activity   Alcohol use: No   Drug use: No   Sexual activity: Yes    Partners: Male    Birth control/protection: Surgical  Other Topics Concern   Not on file  Social History Narrative  DPR brother Radiance Deady [phone]   63 y.o [place] as of 01/13/19   2 y.o daughter as of 01/13/2019    Single    4 brothers    Smoker as of 01/13/19    Works [employer]       Lives at home with her children   Right handed   Caffeine: maybe 2 cups/day   Social Determinants of Health   Financial Resource Strain: Not on file  Food Insecurity: Not on file  Transportation Needs: Not on file  Physical Activity: Not on file  Stress: Not on file  Social Connections: Not on file  Intimate Partner Violence: Not on file   Current Meds  Medication Sig   Cholecalciferol 1.25 MG (50000 UT) capsule Take 1 capsule (50,000 Units total) by mouth once a week.   Allergies  Allergen Reactions   Chantix [Varenicline]     Crying    Lexapro [Escitalopram]     No help    Zoloft [Sertraline]     Zombie    Recent Results (from  the past 2160 hour(s))  Pregnancy, urine POC     Status: None   Collection Time: 11/06/19 10:31 AM  Result Value Ref Range   Preg Test, Ur NEGATIVE NEGATIVE    Comment:        THE SENSITIVITY OF THIS METHODOLOGY IS >24 mIU/mL   Surgical pathology     Status: None   Collection Time: 11/06/19 12:44 PM  Result Value Ref Range   SURGICAL PATHOLOGY      SURGICAL PATHOLOGY CASE: [id number] PATIENT: Isley Gardin Surgical Pathology Report     Clinical History: mammary hypertrophy (cm)   FINAL MICROSCOPIC DIAGNOSIS:  A. BREAST, RIGHT, MAMMOPLASTY: -  Benign breast tissue -  No malignancy identified  B. BREAST, LEFT, MAMMOPLASTY: -  Benign breast tissue -  No malignancy identified   GROSS DESCRIPTION:  A.  Specimen: Right breast tissue Weight: 1635 g Size in aggregate: 29 x 23 x 8 cm Skin: Up to 25 cm, tan-brown, smooth to wrinkled, free of lesions Cut Surface: Approximately 80% yellow lobulated adipose tissue to 20% tan-pink unremarkable fibrous tissue.  Discrete lesions are not grossly seen. Block Summary: 1-2 = representative sections  B.  Specimen: Left breast tissue Weight: 1450 g Size in aggregate: 29 x 23 x 8.5 cm Skin: Up to 29 cm, tan-brown, smooth rectal, free of lesions Cut Surface: Approximately 80% tan-yellow lobulated adipose tissue some 20% tan-pink unremarkable fibrous tissue.   Discrete lesions not grossly seen. Block Summary: 1-2 = representative sections (AK 11/07/2019)      Final Diagnosis performed by Thressa Sheller, MD.   Electronically signed 11/08/2019 Technical and / or Professional components performed at [facility]. [facility], [place] [address] [place], [place] [address].  Immunohistochemistry Technical component (if applicable) was performed at [facility]. [address] [place], [place], [place] [address].   IMMUNOHISTOCHEMISTRY DISCLAIMER (if applicable): Some of these immunohistochemical stains may  have been developed and the performance characteristics determine by [facility]. Some may not have been cleared or approved by the U.S. Food and Drug Administration. The FDA has determined that such clearance or approval is not necessary. This test is used for clinical purposes. It should not be regarded as investigational or for research. This laboratory is certified under the [place] (CLIA-88) as qualified to perform high complexity clinical laboratory testing.  The controls stained appropriately.  Objective  Body mass index is 36.04 kg/m. Wt Readings from Last 3 Encounters:  01/18/20 258 lb 6.4 oz (117.2 kg)  11/06/19 259 lb 0.7 oz (117.5 kg)  10/20/19 256 lb 6.4 oz (116.3 kg)   Temp Readings from Last 3 Encounters:  01/18/20 98 F (36.7 C) (Oral)  12/22/19 98.4 F (36.9 C) (Oral)  11/28/19 99.2 F (37.3 C) (Oral)   BP Readings from Last 3 Encounters:  01/18/20 126/78  12/22/19 126/78  11/28/19 118/74   Pulse Readings from Last 3 Encounters:  01/18/20 88  12/22/19 81  11/28/19 (!) 109    Physical Exam Vitals and nursing note reviewed.  Constitutional:      Appearance: Normal appearance. She is well-developed and well-groomed. She is obese.  HENT:     Head: Normocephalic and atraumatic.  Cardiovascular:     Rate and Rhythm: Normal rate and regular rhythm.     Heart sounds: Normal heart sounds. No murmur heard.   Pulmonary:     Effort: Pulmonary effort is normal.     Breath sounds: Normal breath sounds.  Skin:    General: Skin is warm and dry.  Neurological:     General: No focal deficit present.     Mental Status: She is alert and oriented to person, place, and time. Mental status is at baseline.     Gait: Gait normal.  Psychiatric:        Attention and Perception: Attention and perception normal.        Mood and Affect: Mood and affect normal.        Speech: Speech normal.        Behavior:  Behavior normal. Behavior is cooperative.        Thought Content: Thought content normal.        Cognition and Memory: Cognition and memory normal.        Judgment: Judgment normal.     Assessment  Plan  Intractable migraine with status migrainosus, unspecified migraine type - Plan: venlafaxine XR (EFFEXOR XR) 37.5 MG 24 hr capsule, rizatriptan (MAXALT) 10 MG tablet, ondansetron (ZOFRAN) 4 MG tablet Brain fog  F/u with Dr. Ladona Mow out FMLA today   Depression, recurrent ([place]) - Plan: venlafaxine XR (EFFEXOR XR) 37.5 MG 24 hr capsule Anxiety and depression - Plan: venlafaxine XR (EFFEXOR XR) 37.5 MG 24 hr capsule, ALPRAZolam (XANAX) 0.25 MG tablet Stress - Plan: venlafaxine XR (EFFEXOR XR) 37.5 MG 24 hr capsule PHQ 9 19 and GAD 7 20  Seeing Lerry L[facility]   HM Fasting labs 08/2020 due  Flu shot 12/19/19 Tdap utd covid 19 vaccine moderna 1/2 01/17/20 sch 02/17/20   Pap neg 09/19/18 except trich re check; per pt had HPV vaccine -off seasonale due to DUB/cramping  Smoker 1/4 thto < 1/2ppd tried chantix which made her cry  -as of 10/20/19 quit   FH cancer mom and dad saw genetic counselor in 2012 inconclusive results/not enough knowledge consider 2nd consultation  rec healthy diet and exercise  Mammogram 06/12/19 negative   Colonoscopy age 66   Provider: Dr. Olivia Mackie McLean-Scocuzza-Internal Medicine

## 2020-01-18 NOTE — Patient Instructions (Addendum)
Dr. [facility]  [phone] H/a follow   Magnesium 250 mg daily    Venlafaxine extended-release capsules What is this medicine? VENLAFAXINE(VEN la fax een) is used to treat depression, anxiety and panic disorder. This medicine may be used for other purposes; ask your health care provider or pharmacist if you have questions. COMMON BRAND NAME(S): Effexor XR What should I tell my health care provider before I take this medicine? They need to know if you have any of these conditions:  bleeding disorders  glaucoma  heart disease  high blood pressure  high cholesterol  kidney disease  liver disease  low levels of sodium in the blood  mania or bipolar disorder  seizures  suicidal thoughts, plans, or attempt; a previous suicide attempt by you or a family  take medicines that treat or prevent blood clots  thyroid disease  an unusual or allergic reaction to venlafaxine, desvenlafaxine, other medicines, foods, dyes, or preservatives  pregnant or trying to get pregnant  breast-feeding How should I use this medicine? Take this medicine by mouth with a full glass of water. Follow the directions on the prescription label. Do not cut, crush, or chew this medicine. Take it with food. If needed, the capsule may be carefully opened and the entire contents sprinkled on a spoonful of cool applesauce. Swallow the applesauce/pellet mixture right away without chewing and follow with a glass of water to ensure complete swallowing of the pellets. Try to take your medicine at about the same time each day. Do not take your medicine more often than directed. Do not stop taking this medicine suddenly except upon the advice of your doctor. Stopping this medicine too quickly may cause serious side effects or your condition may worsen. A special MedGuide will be given to you by the pharmacist with each prescription and refill. Be sure to read this information carefully each time. Talk to your  pediatrician regarding the use of this medicine in children. Special care may be needed. Overdosage: If you think you have taken too much of this medicine contact a poison control center or emergency room at once. NOTE: This medicine is only for you. Do not share this medicine with others. What if I miss a dose? If you miss a dose, take it as soon as you can. If it is almost time for your next dose, take only that dose. Do not take double or extra doses. What may interact with this medicine? Do not take this medicine with any of the following medications:  certain medicines for fungal infections like fluconazole, itraconazole, ketoconazole, posaconazole, voriconazole  cisapride  desvenlafaxine  dronedarone  duloxetine  levomilnacipran  linezolid  MAOIs like Carbex, Eldepryl, Marplan, Nardil, and Parnate  methylene blue (injected into a vein)  milnacipran  pimozide  thioridazine This medicine may also interact with the following medications:  amphetamines  aspirin and aspirin-like medicines  certain medicines for depression, anxiety, or psychotic disturbances  certain medicines for migraine headaches like almotriptan, eletriptan, frovatriptan, naratriptan, rizatriptan, sumatriptan, zolmitriptan  certain medicines for sleep  certain medicines that treat or prevent blood clots like dalteparin, enoxaparin, warfarin  cimetidine  clozapine  diuretics  fentanyl  furazolidone  indinavir  isoniazid  lithium  metoprolol  NSAIDS, medicines for pain and inflammation, like ibuprofen or naproxen  other medicines that prolong the QT interval (cause an abnormal heart rhythm) like dofetilide, ziprasidone  procarbazine  rasagiline  supplements like St. John's wort, kava kava, valerian  tramadol  tryptophan This list  may not describe all possible interactions. Give your health care provider a list of all the medicines, herbs, non-prescription drugs, or dietary  supplements you use. Also tell them if you smoke, drink alcohol, or use illegal drugs. Some items may interact with your medicine. What should I watch for while using this medicine? Tell your doctor if your symptoms do not get better or if they get worse. Visit your doctor or health care professional for regular checks on your progress. Because it may take several weeks to see the full effects of this medicine, it is important to continue your treatment as prescribed by your doctor. Patients and their families should watch out for new or worsening thoughts of suicide or depression. Also watch out for sudden changes in feelings such as feeling anxious, agitated, panicky, irritable, hostile, aggressive, impulsive, severely restless, overly excited and hyperactive, or not being able to sleep. If this happens, especially at the beginning of treatment or after a change in dose, call your health care professional. This medicine can cause an increase in blood pressure. Check with your doctor for instructions on monitoring your blood pressure while taking this medicine. You may get drowsy or dizzy. Do not drive, use machinery, or do anything that needs mental alertness until you know how this medicine affects you. Do not stand or sit up quickly, especially if you are an older patient. This reduces the risk of dizzy or fainting spells. Alcohol may interfere with the effect of this medicine. Avoid alcoholic drinks. Your mouth may get dry. Chewing sugarless gum, sucking hard candy and drinking plenty of water will help. Contact your doctor if the problem does not go away or is severe. What side effects may I notice from receiving this medicine? Side effects that you should report to your doctor or health care professional as soon as possible:  allergic reactions like skin rash, itching or hives, swelling of the face, lips, or tongue  anxious  breathing problems  confusion  changes in vision  chest  pain  confusion  elevated mood, decreased need for sleep, racing thoughts, impulsive behavior  eye pain  fast, irregular heartbeat  feeling faint or lightheaded, falls  feeling agitated, angry, or irritable  hallucination, loss of contact with reality  high blood pressure  loss of balance or coordination  palpitations  redness, blistering, peeling or loosening of the skin, including inside the mouth  restlessness, pacing, inability to keep still  seizures  stiff muscles  suicidal thoughts or other mood changes  trouble passing urine or change in the amount of urine  trouble sleeping  unusual bleeding or bruising  unusually weak or tired  vomiting Side effects that usually do not require medical attention (report to your doctor or health care professional if they continue or are bothersome):  change in sex drive or performance  change in appetite or weight  constipation  dizziness  dry mouth  headache  increased sweating  nausea  tired This list may not describe all possible side effects. Call your doctor for medical advice about side effects. You may report side effects to FDA at 1-800-FDA-1088. Where should I keep my medicine? Keep out of the reach of children. Store at a controlled temperature between 20 and 25 degrees C (68 degrees and 77 degrees F), in a dry place. Throw away any unused medicine after the expiration date. NOTE: This sheet is a summary. It may not cover all possible information. If you have questions about this medicine, talk  to your doctor, pharmacist, or health care provider.  2020 Elsevier/Gold Standard (2018-01-11 12:06:43)  Managing Anxiety, Adult After being diagnosed with an anxiety disorder, you may be relieved to know why you have felt or behaved a certain way. You may also feel overwhelmed about the treatment ahead and what it will mean for your life. With care and support, you can manage this condition and recover  from it. How to manage lifestyle changes Managing stress and anxiety  Stress is your body's reaction to life changes and events, both good and bad. Most stress will last just a few hours, but stress can be ongoing and can lead to more than just stress. Although stress can play a major role in anxiety, it is not the same as anxiety. Stress is usually caused by something external, such as a deadline, test, or competition. Stress normally passes after the triggering event has ended.  Anxiety is caused by something internal, such as imagining a terrible outcome or worrying that something will go wrong that will devastate you. Anxiety often does not go away even after the triggering event is over, and it can become long-term (chronic) worry. It is important to understand the differences between stress and anxiety and to manage your stress effectively so that it does not lead to an anxious response. Talk with your health care provider or a counselor to learn more about reducing anxiety and stress. He or she may suggest tension reduction techniques, such as:  Music therapy. This can include creating or listening to music that you enjoy and that inspires you.  Mindfulness-based meditation. This involves being aware of your normal breaths while not trying to control your breathing. It can be done while sitting or walking.  Centering prayer. This involves focusing on a word, phrase, or sacred image that means something to you and brings you peace.  Deep breathing. To do this, expand your stomach and inhale slowly through your nose. Hold your breath for 3-5 seconds. Then exhale slowly, letting your stomach muscles relax.  Self-talk. This involves identifying thought patterns that lead to anxiety reactions and changing those patterns.  Muscle relaxation. This involves tensing muscles and then relaxing them. Choose a tension reduction technique that suits your lifestyle and personality. These techniques take  time and practice. Set aside 5-15 minutes a day to do them. Therapists can offer counseling and training in these techniques. The training to help with anxiety may be covered by some insurance plans. Other things you can do to manage stress and anxiety include:  Keeping a stress/anxiety diary. This can help you learn what triggers your reaction and then learn ways to manage your response.  Thinking about how you react to certain situations. You may not be able to control everything, but you can control your response.  Making time for activities that help you relax and not feeling guilty about spending your time in this way.  Visual imagery and yoga can help you stay calm and relax.  Medicines Medicines can help ease symptoms. Medicines for anxiety include:  Anti-anxiety drugs.  Antidepressants. Medicines are often used as a primary treatment for anxiety disorder. Medicines will be prescribed by a health care provider. When used together, medicines, psychotherapy, and tension reduction techniques may be the most effective treatment. Relationships Relationships can play a big part in helping you recover. Try to spend more time connecting with trusted friends and family members. Consider going to couples counseling, taking family education classes, or going to family therapy.  Therapy can help you and others better understand your condition. How to recognize changes in your anxiety Everyone responds differently to treatment for anxiety. Recovery from anxiety happens when symptoms decrease and stop interfering with your daily activities at home or work. This may mean that you will start to:  Have better concentration and focus. Worry will interfere less in your daily thinking.  Sleep better.  Be less irritable.  Have more energy.  Have improved memory. It is important to recognize when your condition is getting worse. Contact your health care provider if your symptoms interfere with home or  work and you feel like your condition is not improving. Follow these instructions at home: Activity  Exercise. Most adults should do the following: ? Exercise for at least 150 minutes each week. The exercise should increase your heart rate and make you sweat (moderate-intensity exercise). ? Strengthening exercises at least twice a week.  Get the right amount and quality of sleep. Most adults need 7-9 hours of sleep each night. Lifestyle   Eat a healthy diet that includes plenty of vegetables, fruits, whole grains, low-fat dairy products, and lean protein. Do not eat a lot of foods that are high in solid fats, added sugars, or salt.  Make choices that simplify your life.  Do not use any products that contain nicotine or tobacco, such as cigarettes, e-cigarettes, and chewing tobacco. If you need help quitting, ask your health care provider.  Avoid caffeine, alcohol, and certain over-the-counter cold medicines. These may make you feel worse. Ask your pharmacist which medicines to avoid. General instructions  Take over-the-counter and prescription medicines only as told by your health care provider.  Keep all follow-up visits as told by your health care provider. This is important. Where to find support You can get help and support from these sources:  Self-help groups.  Online and [employer].  A trusted spiritual leader.  Couples counseling.  Family education classes.  Family therapy. Where to find more information You may find that joining a support group helps you deal with your anxiety. The following sources can help you locate counselors or support groups near you:  Mental Health America: www.mentalhealthamerica.net  Anxiety and Depression Association of [place] (ADAA): ProgramCam.de  [employer] Mental [employer]ness (NAMI): www.nami.org Contact a health care provider if you:  Have a hard time staying focused or finishing daily tasks.  Spend many  hours a day feeling worried about everyday life.  Become exhausted by worry.  Start to have headaches, feel tense, or have nausea.  Urinate more than normal.  Have diarrhea. Get help right away if you have:  A racing heart and shortness of breath.  Thoughts of hurting yourself or others. If you ever feel like you may hurt yourself or others, or have thoughts about taking your own life, get help right away. You can go to your nearest emergency department or call:  Your local emergency services (911 in the [place]  A suicide crisis helpline, such as the National Suicide Prevention Lifeline at 585-664-7298. This is open 24 hours a day. Summary  Taking steps to learn and use tension reduction techniques can help calm you and help prevent triggering an anxiety reaction.  When used together, medicines, psychotherapy, and tension reduction techniques may be the most effective treatment.  Family, friends, and partners can play a big part in helping you recover from an anxiety disorder. This information is not intended to replace advice given to you by your health care  provider. Make sure you discuss any questions you have with your health care provider. Document Revised: 06/21/2018 Document Reviewed: 06/21/2018 Elsevier Patient Education  2020 Elsevier Inc.   Rizatriptan tablets What is this medicine? RIZATRIPTAN (rye za TRIP tan) is used to treat migraines with or without aura. An aura is a strange feeling or visual disturbance that warns you of an attack. It is not used to prevent migraines. This medicine may be used for other purposes; ask your health care provider or pharmacist if you have questions. COMMON BRAND NAME(S): Maxalt What should I tell my health care provider before I take this medicine? They need to know if you have any of these conditions:  cigarette smoker  circulation problems in fingers and toes  diabetes  heart disease  high blood pressure  high  cholesterol  history of irregular heartbeat  history of stroke  kidney disease  liver disease  stomach or intestine problems  an unusual or allergic reaction to rizatriptan, other medicines, foods, dyes, or preservatives  pregnant or trying to get pregnant  breast-feeding How should I use this medicine? Take this medicine by mouth with a glass of water. Follow the directions on the prescription label. Do not take it more often than directed. Talk to your pediatrician regarding the use of this medicine in children. While this drug may be prescribed for children as young as 6 years for selected conditions, precautions do apply. Overdosage: If you think you have taken too much of this medicine contact a poison control center or emergency room at once. NOTE: This medicine is only for you. Do not share this medicine with others. What if I miss a dose? This does not apply. This medicine is not for regular use. What may interact with this medicine? Do not take this medicine with any of the following medicines:  certain medicines for migraine headache like almotriptan, eletriptan, frovatriptan, naratriptan, rizatriptan, sumatriptan, zolmitriptan  ergot alkaloids like dihydroergotamine, ergonovine, ergotamine, methylergonovine  MAOIs like Carbex, Eldepryl, Marplan, Nardil, and Parnate This medicine may also interact with the following medications:  certain medicines for depression, anxiety, or psychotic disorders  propranolol This list may not describe all possible interactions. Give your health care provider a list of all the medicines, herbs, non-prescription drugs, or dietary supplements you use. Also tell them if you smoke, drink alcohol, or use illegal drugs. Some items may interact with your medicine. What should I watch for while using this medicine? Visit your healthcare professional for regular checks on your progress. Tell your healthcare professional if your symptoms do not  start to get better or if they get worse. You may get drowsy or dizzy. Do not drive, use machinery, or do anything that needs mental alertness until you know how this medicine affects you. Do not stand up or sit up quickly, especially if you are an older patient. This reduces the risk of dizzy or fainting spells. Alcohol may interfere with the effect of this medicine. Your mouth may get dry. Chewing sugarless gum or sucking hard candy and drinking plenty of water may help. Contact your healthcare professional if the problem does not go away or is severe. If you take migraine medicines for 10 or more days a month, your migraines may get worse. Keep a diary of headache days and medicine use. Contact your healthcare professional if your migraine attacks occur more frequently. What side effects may I notice from receiving this medicine? Side effects that you should report to your doctor or  health care professional as soon as possible:  allergic reactions like skin rash, itching or hives, swelling of the face, lips, or tongue  chest pain or chest tightness  signs and symptoms of a dangerous change in heartbeat or heart rhythm like chest pain; dizziness; fast, irregular heartbeat; palpitations; feeling faint or lightheaded; falls; breathing problems  signs and symptoms of a stroke like changes in vision; confusion; trouble speaking or understanding; severe headaches; sudden numbness or weakness of the face, arm or leg; trouble walking; dizziness; loss of balance or coordination  signs and symptoms of serotonin syndrome like irritable; confusion; diarrhea; fast or irregular heartbeat; muscle twitching; stiff muscles; trouble walking; sweating; high fever; seizures; chills; vomiting Side effects that usually do not require medical attention (report to your doctor or health care professional if they continue or are bothersome):  diarrhea  dizziness  drowsiness  dry mouth  headache  nausea,  vomiting  pain, tingling, numbness in the hands or feet  stomach pain This list may not describe all possible side effects. Call your doctor for medical advice about side effects. You may report side effects to FDA at 1-800-FDA-1088. Where should I keep my medicine? Keep out of the reach of children. Store at room temperature between 15 and 30 degrees C (59 and 86 degrees F). Keep container tightly closed. Throw away any unused medicine after the expiration date. NOTE: This sheet is a summary. It may not cover all possible information. If you have questions about this medicine, talk to your doctor, pharmacist, or health care provider.  2020 Elsevier/Gold Standard (2017-08-03 14:59:59)  Migraine Headache A migraine headache is an intense, throbbing pain on one side or both sides of the head. Migraine headaches may also cause other symptoms, such as nausea, vomiting, and sensitivity to light and noise. A migraine headache can last from 4 hours to 3 days. Talk with your doctor about what things may bring on (trigger) your migraine headaches. What are the causes? The exact cause of this condition is not known. However, a migraine may be caused when nerves in the brain become irritated and release chemicals that cause inflammation of blood vessels. This inflammation causes pain. This condition may be triggered or caused by:  Drinking alcohol.  Smoking.  Taking medicines, such as: ? Medicine used to treat chest pain (nitroglycerin). ? Birth control pills. ? Estrogen. ? Certain blood pressure medicines.  Eating or drinking products that contain nitrates, glutamate, aspartame, or tyramine. Aged cheeses, chocolate, or caffeine may also be triggers.  Doing physical activity. Other things that may trigger a migraine headache include:  Menstruation.  Pregnancy.  Hunger.  Stress.  Lack of sleep or too much sleep.  Weather changes.  Fatigue. What increases the risk? The following  factors may make you more likely to experience migraine headaches:  Being a certain age. This condition is more common in people who are 44-83 years old.  Being female.  Having a family history of migraine headaches.  Being Caucasian.  Having a mental health condition, such as depression or anxiety.  Being obese. What are the signs or symptoms? The main symptom of this condition is pulsating or throbbing pain. This pain may:  Happen in any area of the head, such as on one side or both sides.  Interfere with daily activities.  Get worse with physical activity.  Get worse with exposure to bright lights or loud noises. Other symptoms may include:  Nausea.  Vomiting.  Dizziness.  General sensitivity to  bright lights, loud noises, or smells. Before you get a migraine headache, you may get warning signs (an aura). An aura may include:  Seeing flashing lights or having blind spots.  Seeing bright spots, halos, or zigzag lines.  Having tunnel vision or blurred vision.  Having numbness or a tingling feeling.  Having trouble talking.  Having muscle weakness. Some people have symptoms after a migraine headache (postdromal phase), such as:  Feeling tired.  Difficulty concentrating. How is this diagnosed? A migraine headache can be diagnosed based on:  Your symptoms.  A physical exam.  Tests, such as: ? CT scan or an MRI of the head. These imaging tests can help rule out other causes of headaches. ? Taking fluid from the spine (lumbar puncture) and analyzing it (cerebrospinal fluid analysis, or CSF analysis). How is this treated? This condition may be treated with medicines that:  Relieve pain.  Relieve nausea.  Prevent migraine headaches. Treatment for this condition may also include:  Acupuncture.  Lifestyle changes like avoiding foods that trigger migraine headaches.  Biofeedback.  Cognitive behavioral therapy. Follow these instructions at  home: Medicines  Take over-the-counter and prescription medicines only as told by your health care provider.  Ask your health care provider if the medicine prescribed to you: ? Requires you to avoid driving or using heavy machinery. ? Can cause constipation. You may need to take these actions to prevent or treat constipation:  Drink enough fluid to keep your urine pale yellow.  Take over-the-counter or prescription medicines.  Eat foods that are high in fiber, such as beans, whole grains, and fresh fruits and vegetables.  Limit foods that are high in fat and processed sugars, such as fried or sweet foods. Lifestyle  Do not drink alcohol.  Do not use any products that contain nicotine or tobacco, such as cigarettes, e-cigarettes, and chewing tobacco. If you need help quitting, ask your health care provider.  Get at least 8 hours of sleep every night.  Find ways to manage stress, such as meditation, deep breathing, or yoga. General instructions      Keep a journal to find out what may trigger your migraine headaches. For example, write down: ? What you eat and drink. ? How much sleep you get. ? Any change to your diet or medicines.  If you have a migraine headache: ? Avoid things that make your symptoms worse, such as bright lights. ? It may help to lie down in a dark, quiet room. ? Do not drive or use heavy machinery. ? Ask your health care provider what activities are safe for you while you are experiencing symptoms.  Keep all follow-up visits as told by your health care provider. This is important. Contact a health care provider if:  You develop symptoms that are different or more severe than your usual migraine headache symptoms.  You have more than 15 headache days in one month. Get help right away if:  Your migraine headache becomes severe.  Your migraine headache lasts longer than 72 hours.  You have a fever.  You have a stiff neck.  You have vision  loss.  Your muscles feel weak or like you cannot control them.  You start to lose your balance often.  You have trouble walking.  You faint.  You have a seizure. Summary  A migraine headache is an intense, throbbing pain on one side or both sides of the head. Migraines may also cause other symptoms, such as nausea, vomiting, and sensitivity  to light and noise.  This condition may be treated with medicines and lifestyle changes. You may also need to avoid certain things that trigger a migraine headache.  Keep a journal to find out what may trigger your migraine headaches.  Contact your health care provider if you have more than 15 headache days in a month or you develop symptoms that are different or more severe than your usual migraine headache symptoms. This information is not intended to replace advice given to you by your health care provider. Make sure you discuss any questions you have with your health care provider. Document Revised: 05/13/2018 Document Reviewed: 03/03/2018 Elsevier Patient Education  2020 [employer].

## 2020-01-19 ENCOUNTER — Ambulatory Visit: Payer: 59 | Admitting: Internal Medicine

## 2020-01-19 NOTE — Telephone Encounter (Signed)
Form completed at last appt 01/2020 and given to pt   Rec f/u neurology migraines call to schedule

## 2020-01-22 NOTE — Progress Notes (Deleted)
We have a cancellation with Megan NP this afternoon 12/20 at 3 pm. I called the patient and LVM asking for call back asap to get her scheduled.

## 2020-01-23 ENCOUNTER — Encounter: Payer: Self-pay | Admitting: Internal Medicine

## 2020-01-23 ENCOUNTER — Encounter: Payer: Self-pay | Admitting: *Deleted

## 2020-01-23 NOTE — Telephone Encounter (Signed)
Late entry 01/22/20 called pt and LVM asking for call back to schedule appt.   01/23/20 called pt again and LVM asking for call back to schedule appt. Also sent mychart message.

## 2020-04-17 ENCOUNTER — Ambulatory Visit: Payer: 59 | Admitting: Internal Medicine

## 2020-04-17 ENCOUNTER — Telehealth: Payer: Self-pay | Admitting: Internal Medicine

## 2020-04-17 NOTE — Telephone Encounter (Signed)
Patient no-showed today's appointment; appointment was for 04/17/20, provider notified for review of record.   Letter sent for patient to call in and re-schedule.

## 2020-06-21 ENCOUNTER — Ambulatory Visit: Payer: 59 | Admitting: Surgical

## 2020-07-05 DIAGNOSIS — D649 Anemia, unspecified: Secondary | ICD-10-CM | POA: Insufficient documentation

## 2020-07-09 ENCOUNTER — Ambulatory Visit: Payer: Self-pay | Admitting: Surgical

## 2020-07-12 ENCOUNTER — Other Ambulatory Visit: Payer: Self-pay

## 2020-07-15 ENCOUNTER — Ambulatory Visit (INDEPENDENT_AMBULATORY_CARE_PROVIDER_SITE_OTHER): Payer: Managed Care, Other (non HMO) | Admitting: Obstetrics and Gynecology

## 2020-07-15 ENCOUNTER — Other Ambulatory Visit (HOSPITAL_COMMUNITY)
Admission: RE | Admit: 2020-07-15 | Discharge: 2020-07-15 | Disposition: A | Discharge status: Home / Self Care | Payer: Managed Care, Other (non HMO) | Source: Ambulatory Visit | Attending: Obstetrics and Gynecology | Admitting: Obstetrics and Gynecology

## 2020-07-15 ENCOUNTER — Encounter: Payer: Self-pay | Admitting: Obstetrics and Gynecology

## 2020-07-15 ENCOUNTER — Other Ambulatory Visit: Payer: Self-pay

## 2020-07-15 VITALS — BP 132/74 | HR 87 | Ht 71.0 in | Wt 236.8 lb

## 2020-07-15 DIAGNOSIS — Z1339 Encounter for screening examination for other mental health and behavioral disorders: Secondary | ICD-10-CM

## 2020-07-15 DIAGNOSIS — Z01419 Encounter for gynecological examination (general) (routine) without abnormal findings: Secondary | ICD-10-CM

## 2020-07-15 DIAGNOSIS — Z1331 Encounter for screening for depression: Secondary | ICD-10-CM

## 2020-07-15 DIAGNOSIS — Z803 Family history of malignant neoplasm of breast: Secondary | ICD-10-CM

## 2020-07-15 DIAGNOSIS — Z124 Encounter for screening for malignant neoplasm of cervix: Secondary | ICD-10-CM

## 2020-07-15 DIAGNOSIS — Z113 Encounter for screening for infections with a predominantly sexual mode of transmission: Secondary | ICD-10-CM

## 2020-07-15 NOTE — Progress Notes (Signed)
Gynecology Annual Exam  PCP: McLean-Scocuzza, Nino Glow, MD  Chief Complaint:  Chief Complaint  Patient presents with   Annual Exam    No complaints.Desires STI Screening   History of Present Illness:  Ms. MANIE BEALER is a 35 y.o. [id number] who LMP was Patient's last menstrual period was 07/11/2020 (exact date)., presents today for her annual examination.  Her menses are regular every 28-30 days, lasting 3 day(s).  Dysmenorrhea severe, occurring premenstrually. She does not have intermenstrual bleeding.  She is not sexually active.  Last Pap: 09/19/2018  Results were: no abnormalities /neg HPV DNA negative Hx of STDs: chlamydia, trichomonas  Mammogram 2021: BiRads 1  There is a FH of breast cancer. There is a FH of ovarian cancer. The patient does do self-breast exams.  Tobacco use: smokes 1/4 ppd Alcohol use: social drinker Exercise: walks every day at least 2 miles  She had genetic testing for mutations related to breast and ovarian cancer and it appears she had a mutation of uncertain significance.   The patient wears seatbelts: yes.   The patient reports that domestic violence in her life is absent.   Past Medical History:  Diagnosis Date   Anemia    Anxiety and depression    lexapro 10 mg no help, zoloft like zombie   BRCA negative    per pt report, done through [facility]   Chronic low back pain    11/05/2018 MRI L spine 3. Minimal lower lumbar degenerative disc disease without spinal   COVID-19    09/11/19   COVID-19    09/11/19   Heavy menses    Increased risk of breast cancer    IBIS=25.3%   Large breasts    Ovarian cyst 2007    Past Surgical History:  Procedure Laterality Date   BREAST REDUCTION SURGERY Bilateral 11/06/2019   Procedure: BILATERAL MAMMARY REDUCTION  (BREAST);  Surgeon: Wallace Going, DO;  Location: [facility];  Service: Plastics;  Laterality: Bilateral;   CESAREAN SECTION  2008   FTP   CESAREAN SECTION WITH BILATERAL  TUBAL LIGATION N/A 02/04/2016   Procedure: CESAREAN SECTION WITH BILATERAL TUBAL LIGATION;  Surgeon: Will Bonnet, MD;  Location: [facility];  Service: Obstetrics;  Laterality: N/A;   LAPAROTOMY  2007   ruptured ovarian cyst Dr. Ammie Dalton     Prior to Admission medications   Medication Sig Start Date End Date Taking? Authorizing Provider  ALPRAZolam Duanne Moron) 0.25 MG tablet Take 0.5-1 tablets (0.125-0.25 mg total) by mouth daily as needed for anxiety. 01/18/20  Yes McLean-Scocuzza, Nino Glow, MD  Cholecalciferol 1.25 MG (50000 UT) capsule Take 1 capsule (50,000 Units total) by mouth once a week. 02/06/19  Yes McLean-Scocuzza, Nino Glow, MD  rizatriptan (MAXALT) 10 MG tablet Take 1 tablet (10 mg total) by mouth as needed for migraine. May repeat in 2 hours if needed. Max 20 mg daily 01/18/20  Yes McLean-Scocuzza, Nino Glow, MD    Allergies  Allergen Reactions   Chantix [Varenicline]     Crying    Lexapro [Escitalopram]     No help    Zoloft [Sertraline]     Zombie     Obstetric History: [id number], s/p c-section x 2  Social History   Socioeconomic History   Marital status: Single    Spouse name: Not on file   Number of children: 2   Years of education: Not on file   Highest education level: Not on file  Occupational History  Not on file  Tobacco Use   Smoking status: Some Days    Packs/day: 0.25    Pack years: 0.00    Types: Cigarettes    Last attempt to quit: 09/2019    Years since quitting: 0.8   Smokeless tobacco: Never  Vaping Use   Vaping Use: Never used  Substance and Sexual Activity   Alcohol use: No   Drug use: No   Sexual activity: Yes    Partners: Male    Birth control/protection: Surgical  Other Topics Concern   Not on file  Social History Narrative   DPR brother Karman [place] [phone]   46 y.o V[place]n as of 01/13/19   2 y.o daughter as of 01/13/2019    Single    4 brothers    Smoker as of 01/13/19    Worked [employer] [place]  [place] and now back working [employer] in [place] and can work at home      Lives at home with her children   Right handed   Caffeine: maybe 2 cups/day   Social Determinants of Radio broadcast assistant Strain: Not on file  Food Insecurity: Not on file  Transportation Needs: Not on file  Physical Activity: Not on file  Stress: Not on file  Social Connections: Not on file  Intimate Partner Violence: Not on file    Family History  Problem Relation Age of Onset   Brain cancer Mother    Hypertension Mother    Ovarian cancer Mother 49   Lung cancer Mother    Cancer Mother        ovarian to lung to brain   Headache Mother    Diabetes Mellitus I Father    Pancreatic cancer Father 57   Cancer Father        stomach cancer   Diabetes Father    Hypertension Brother    Breast cancer Maternal Aunt 40   Diabetes Mellitus II Paternal Aunt    Diabetes Mellitus II Paternal Grandmother    Breast cancer Maternal Aunt 61   Anxiety disorder Brother     Review of Systems  Constitutional: Negative.   HENT: Negative.    Eyes: Negative.   Respiratory: Negative.    Cardiovascular: Negative.   Gastrointestinal: Negative.   Genitourinary: Negative.   Musculoskeletal: Negative.   Skin: Negative.   Neurological: Negative.   Psychiatric/Behavioral: Negative.      Physical Exam BP 132/74 (Cuff Size: Normal)   Pulse 87   Ht 5' 11" (1.803 m)   Wt 236 lb 12.8 oz (107.4 kg)   LMP 07/11/2020 (Exact Date)   BMI 33.03 kg/m    Physical Exam Constitutional:      General: She is not in acute distress.    Appearance: Normal appearance. She is well-developed.  Genitourinary:     Vulva and bladder normal.     Right Labia: No rash, tenderness, lesions, skin changes or Bartholin's cyst.    Left Labia: No tenderness, lesions, skin changes, Bartholin's cyst or rash.    No inguinal adenopathy present in the right or left side.    Pelvic Tanner Score: 5/5.    No vaginal discharge, erythema,  tenderness or bleeding.      Right Adnexa: not tender, not full and no mass present.    Left Adnexa: not tender, not full and no mass present.    No cervical motion tenderness, discharge, lesion or polyp.     Uterus  is not enlarged or tender.     No uterine mass detected.    Pelvic exam was performed with patient in the lithotomy position.  Breasts:    Right: No inverted nipple, mass, nipple discharge, skin change or tenderness.     Left: No inverted nipple, mass, nipple discharge, skin change or tenderness.  HENT:     Head: Normocephalic and atraumatic.  Eyes:     General: No scleral icterus.    Conjunctiva/sclera: Conjunctivae normal.  Neck:     Thyroid: No thyromegaly.  Cardiovascular:     Rate and Rhythm: Normal rate and regular rhythm.     Heart sounds: No murmur heard.   No friction rub. No gallop.  Pulmonary:     Effort: Pulmonary effort is normal. No respiratory distress.     Breath sounds: Normal breath sounds. No wheezing or rales.  Abdominal:     General: Bowel sounds are normal. There is no distension.     Palpations: Abdomen is soft. There is no mass.     Tenderness: There is no abdominal tenderness. There is no guarding or rebound.     Hernia: There is no hernia in the left inguinal area or right inguinal area.  Musculoskeletal:        General: No swelling or tenderness. Normal range of motion.     Cervical back: Normal range of motion and neck supple.  Lymphadenopathy:     Cervical: No cervical adenopathy.     Lower Body: No right inguinal adenopathy. No left inguinal adenopathy.  Neurological:     General: No focal deficit present.     Mental Status: She is alert and oriented to person, place, and time.     Cranial Nerves: No cranial nerve deficit.  Skin:    General: Skin is warm and dry.     Findings: No erythema or rash.  Psychiatric:        Mood and Affect: Mood normal.        Behavior: Behavior normal.        Judgment: Judgment normal.    Female  chaperone present for pelvic and breast  portions of the physical exam  Results: AUDIT Questionnaire (screen for alcoholism): 1 PHQ-9: 9   Assessment: 35 y.o. G38P2002 female here for routine annual gynecologic examination  Plan: Problem List Items Addressed This Visit   None Visit Diagnoses     Women's annual routine gynecological examination    -  Primary   Relevant Orders   Cervicovaginal ancillary only (Completed)   STD Panel   Screening for depression       Screening for alcoholism       Pap smear for cervical cancer screening       Screen for STD (sexually transmitted disease)       Relevant Orders   Cervicovaginal ancillary only (Completed)   STD Panel   Family history of breast cancer           Screening: -- Blood pressure screen normal -- Weight screening: obese: discussed management options, including lifestyle, dietary, and exercise. -- Depression screening negative (PHQ-9) -- Nutrition: normal -- cholesterol screening: not due for screening -- osteoporosis screening: not due -- tobacco screening: using: discussed quitting using the 5 A's -- alcohol screening: AUDIT questionnaire indicates low-risk usage. -- family history of breast cancer screening: done. not at high risk. -- no evidence of domestic violence or intimate partner violence. -- STD screening: gonorrhea/chlamydia NAAT collected -- pap smear not collected  per ASCCP guidelines  Family history of breast cancer: order 3D mammo, order MRI for 03/2021. Discussed Q6-12 months clinical breast exams.  Prentice Docker, MD 07/16/2020 11:44 PM

## 2020-07-16 ENCOUNTER — Encounter: Payer: Self-pay | Admitting: Internal Medicine

## 2020-07-16 ENCOUNTER — Ambulatory Visit (INDEPENDENT_AMBULATORY_CARE_PROVIDER_SITE_OTHER): Payer: Managed Care, Other (non HMO) | Admitting: Internal Medicine

## 2020-07-16 ENCOUNTER — Encounter: Payer: Self-pay | Admitting: Obstetrics and Gynecology

## 2020-07-16 VITALS — BP 108/80 | HR 81 | Temp 97.8°F | Ht 71.0 in | Wt 237.0 lb

## 2020-07-16 DIAGNOSIS — E669 Obesity, unspecified: Secondary | ICD-10-CM

## 2020-07-16 DIAGNOSIS — R053 Chronic cough: Secondary | ICD-10-CM

## 2020-07-16 DIAGNOSIS — R739 Hyperglycemia, unspecified: Secondary | ICD-10-CM | POA: Diagnosis not present

## 2020-07-16 DIAGNOSIS — E538 Deficiency of other specified B group vitamins: Secondary | ICD-10-CM | POA: Diagnosis not present

## 2020-07-16 DIAGNOSIS — Z13818 Encounter for screening for other digestive system disorders: Secondary | ICD-10-CM

## 2020-07-16 DIAGNOSIS — G43911 Migraine, unspecified, intractable, with status migrainosus: Secondary | ICD-10-CM

## 2020-07-16 DIAGNOSIS — F32A Depression, unspecified: Secondary | ICD-10-CM | POA: Diagnosis not present

## 2020-07-16 DIAGNOSIS — R519 Headache, unspecified: Secondary | ICD-10-CM

## 2020-07-16 DIAGNOSIS — R918 Other nonspecific abnormal finding of lung field: Secondary | ICD-10-CM | POA: Insufficient documentation

## 2020-07-16 DIAGNOSIS — F419 Anxiety disorder, unspecified: Secondary | ICD-10-CM

## 2020-07-16 DIAGNOSIS — G4700 Insomnia, unspecified: Secondary | ICD-10-CM

## 2020-07-16 DIAGNOSIS — E559 Vitamin D deficiency, unspecified: Secondary | ICD-10-CM | POA: Diagnosis not present

## 2020-07-16 DIAGNOSIS — E785 Hyperlipidemia, unspecified: Secondary | ICD-10-CM

## 2020-07-16 DIAGNOSIS — F988 Other specified behavioral and emotional disorders with onset usually occurring in childhood and adolescence: Secondary | ICD-10-CM | POA: Diagnosis not present

## 2020-07-16 DIAGNOSIS — R6881 Early satiety: Secondary | ICD-10-CM

## 2020-07-16 DIAGNOSIS — F439 Reaction to severe stress, unspecified: Secondary | ICD-10-CM

## 2020-07-16 DIAGNOSIS — F339 Major depressive disorder, recurrent, unspecified: Secondary | ICD-10-CM

## 2020-07-16 DIAGNOSIS — Z720 Tobacco use: Secondary | ICD-10-CM

## 2020-07-16 DIAGNOSIS — R9389 Abnormal findings on diagnostic imaging of other specified body structures: Secondary | ICD-10-CM

## 2020-07-16 DIAGNOSIS — G8929 Other chronic pain: Secondary | ICD-10-CM

## 2020-07-16 LAB — CERVICOVAGINAL ANCILLARY ONLY
Chlamydia: NEGATIVE
Comment: NEGATIVE
Comment: NEGATIVE
Comment: NORMAL
Neisseria Gonorrhea: NEGATIVE
Trichomonas: NEGATIVE

## 2020-07-16 MED ORDER — VENLAFAXINE HCL ER 37.5 MG PO CP24: 37.5000 mg | ORAL_CAPSULE | Freq: Every day | ORAL | 3 refills | Status: DC

## 2020-07-16 MED ORDER — ALPRAZOLAM 0.5 MG PO TABS: 0.2500 mg | ORAL_TABLET | Freq: Every day | ORAL | 1 refills | Status: DC | PRN

## 2020-07-16 MED ORDER — HYDROXYZINE HCL 25 MG PO TABS: 25.0000 mg | ORAL_TABLET | Freq: Every evening | ORAL | 5 refills | Status: DC | PRN

## 2020-07-16 NOTE — Patient Instructions (Addendum)
Call for appointment for therapy in person and psychiatry [facility] counseling and psychiatry [facility] [address] [place] [address] [phone]   [facility] [place] [address] [place] [address] [phone]  Or call SEL call back    Consider moderna vaccine 11/05/20   [phone] Not available [address]   [address]   [place] [address]    Lung doctors will call you discuss CT scan 07/07/20 and chronic cough  IMPRESSION:  1.  No pulmonary embolism.  2.  Mild mediastinal lymphadenopathy, indeterminate. Mild mosaic attenuation in the lungs/ground glass attenuation, cannot exclude infectious or inflammatory process.Marland Kitchen

## 2020-07-16 NOTE — Progress Notes (Signed)
Chief Complaint  Patient presents with   Weight Loss   Fu 1. Stress, attn problems, Anxiety and depression gad 7 score 13 and phq 9 score 13 family dynamic stressors and trauma in the past recently moved to [facility] 12/2020 to 06/2020 to be with long time friend and now they are  not friends due to bad business deal so she moved back  She liked therapist at [facility] but may call [facility] and ive also rec psychiatry  On xanax 0.25 which helps but wants to try a stronger disc disc addition potential with this medication wants refill effexor 37.5 mg qd  Having trouble sleeping as well due to mind racing though the lights are off  2. Obesity wt has been as low as 180 lbs and up to 215 and now even higher she does not eat many meals not hungry due to #1  3. Chronic cough she is smoker and was exp to covid 07/07/20 but covid neg and CT chest abnormal with ground glass changes inflammatory vs infections 07/07/20 and h/o covid 19 09/11/19  Will refer to pull rec smoking cessation  Reviewed labs tsh normal, trop normal d dimer sl elevated 0.51 but ct chest negative prior US legs normal mag 2.0 cmet, cbc normal she does report chronic right >left leg swelling we disc. Compression socks coppertone from [employer]   CT scan 07/07/20 and chronic cough  IMPRESSION:  1.  No pulmonary embolism with d dimer 07/08/19 0.51 sl elevated 2.  Mild mediastinal lymphadenopathy, indeterminate. Mild mosaic attenuation in the lungs/ground glass attenuation, cannot exclude infectious or inflammatory process..   Review of Systems  HENT:  Negative for hearing loss.   Eyes:  Negative for blurred vision.  Respiratory:  Positive for cough.   Cardiovascular:  Negative for chest pain.  Musculoskeletal:  Negative for falls and joint pain.  Skin:  Negative for rash.  Psychiatric/Behavioral:  Positive for depression. The patient is nervous/anxious and has insomnia.        Stress  Past Medical History:  Diagnosis Date   Anemia    Anxiety and  depression    lexapro 10 mg no help, zoloft like zombie   BRCA negative    per pt report, done through [facility]   Chronic low back pain    11/05/2018 MRI L spine 3. Minimal lower lumbar degenerative disc disease without spinal   COVID-19    09/11/19   COVID-19    09/11/19   Heavy menses    Increased risk of breast cancer    IBIS=25.3%   Large breasts    Ovarian cyst 2007   Past Surgical History:  Procedure Laterality Date   BREAST REDUCTION SURGERY Bilateral 11/06/2019   Procedure: BILATERAL MAMMARY REDUCTION  (BREAST);  Surgeon: Wallace Going, DO;  Location: [facility];  Service: Plastics;  Laterality: Bilateral;   CESAREAN SECTION  2008   FTP   CESAREAN SECTION WITH BILATERAL TUBAL LIGATION N/A 02/04/2016   Procedure: CESAREAN SECTION WITH BILATERAL TUBAL LIGATION;  Surgeon: Will Bonnet, MD;  Location: [facility];  Service: Obstetrics;  Laterality: N/A;   LAPAROTOMY  2007   ruptured ovarian cyst Dr. Ammie Dalton    Family History  Problem Relation Age of Onset   Brain cancer Mother    Hypertension Mother    Ovarian cancer Mother 34   Lung cancer Mother    Cancer Mother        ovarian to lung to brain   Headache  Mother    Diabetes Mellitus I Father    Pancreatic cancer Father 27   Cancer Father        stomach cancer   Diabetes Father    Hypertension Brother    Breast cancer Maternal Aunt 12   Diabetes Mellitus II Paternal Aunt    Diabetes Mellitus II Paternal Grandmother    Breast cancer Maternal Aunt 76   Anxiety disorder Brother    Social History   Socioeconomic History   Marital status: Single    Spouse name: Not on file   Number of children: 2   Years of education: Not on file   Highest education level: Not on file  Occupational History   Not on file  Tobacco Use   Smoking status: Some Days    Packs/day: 0.25    Pack years: 0.00    Types: Cigarettes    Last attempt to quit: 09/2019    Years since quitting: 0.8   Smokeless tobacco:  Never  Vaping Use   Vaping Use: Never used  Substance and Sexual Activity   Alcohol use: No   Drug use: No   Sexual activity: Yes    Partners: Male    Birth control/protection: Surgical  Other Topics Concern   Not on file  Social History Narrative   DPR brother Nelwyn [place] [phone]   33 y.o V[place]n as of 01/13/19   2 y.o daughter as of 01/13/2019    Single    4 brothers    Smoker as of 01/13/19    Worked [employer] [place] and now back working [employer] in [place] and can work at home      Lives at home with her children   Right handed   Caffeine: maybe 2 cups/day   Social Determinants of Radio broadcast assistant Strain: Not on file  Food Insecurity: Not on file  Transportation Needs: Not on file  Physical Activity: Not on file  Stress: Not on file  Social Connections: Not on file  Intimate Partner Violence: Not on file   Current Meds  Medication Sig   Cholecalciferol 1.25 MG (50000 UT) capsule Take 1 capsule (50,000 Units total) by mouth once a week.   hydrOXYzine (ATARAX/VISTARIL) 25 MG tablet Take 1-2 tablets (25-50 mg total) by mouth at bedtime as needed.   rizatriptan (MAXALT) 10 MG tablet Take 1 tablet (10 mg total) by mouth as needed for migraine. May repeat in 2 hours if needed. Max 20 mg daily   [DISCONTINUED] venlafaxine XR (EFFEXOR XR) 37.5 MG 24 hr capsule Take 1 capsule (37.5 mg total) by mouth daily with breakfast.   Allergies  Allergen Reactions   Chantix [Varenicline]     Crying    Lexapro [Escitalopram]     No help    Zoloft [Sertraline]     Zombie    No results found for this or any previous visit (from the past 2160 hour(s)). Objective  Body mass index is 33.05 kg/m. Wt Readings from Last 3 Encounters:  07/16/20 237 lb (107.5 kg)  07/15/20 236 lb 12.8 oz (107.4 kg)  01/18/20 258 lb 6.4 oz (117.2 kg)   Temp Readings from Last 3 Encounters:  07/16/20 97.8 F (36.6 C) (Oral)  01/18/20 98 F  (36.7 C) (Oral)  12/22/19 98.4 F (36.9 C) (Oral)   BP Readings from Last 3 Encounters:  07/16/20 108/80  07/15/20 132/74  01/18/20 126/78   Pulse Readings from Last  3 Encounters:  07/16/20 81  07/15/20 87  01/18/20 88    Physical Exam Vitals and nursing note reviewed.  Constitutional:      Appearance: Normal appearance. She is well-developed and well-groomed. She is obese.  HENT:     Head: Normocephalic and atraumatic.  Eyes:     Conjunctiva/sclera: Conjunctivae normal.     Pupils: Pupils are equal, round, and reactive to light.  Cardiovascular:     Rate and Rhythm: Normal rate and regular rhythm.     Heart sounds: Normal heart sounds. No murmur heard. Pulmonary:     Effort: Pulmonary effort is normal.     Breath sounds: Normal breath sounds.  Skin:    General: Skin is warm and moist.  Neurological:     General: No focal deficit present.     Mental Status: She is alert and oriented to person, place, and time. Mental status is at baseline.     Gait: Gait normal.  Psychiatric:        Attention and Perception: Attention and perception normal.        Mood and Affect: Mood and affect normal.        Speech: Speech normal.        Behavior: Behavior normal. Behavior is cooperative.        Thought Content: Thought content normal.        Cognition and Memory: Cognition normal.        Judgment: Judgment normal.    Assessment  Plan  Anxiety and depression/stress/insomnia/attention issues - Plan: ALPRAZolam (XANAX) 0.5 MG tablet increase dose from 0.25  venlafaxine XR (EFFEXOR XR) 37.5 MG 24 hr capsule Depression, recurrent ([place]) - Plan: venlafaxine XR (EFFEXOR XR) 37.5 MG 24 hr capsule Depression, recurrent ([place]) - Plan: venlafaxine XR (EFFEXOR XR) 37.5 MG 24 hr capsule Insomnia, unspecified type - Plan: hydrOXYzine (ATARAX/VISTARIL) 25 MG tablet all for appointment for therapy in person and psychiatry [facility] counseling and psychiatry [place] [phone]   [facility] [place] [address] [place] [address] [phone]  Or call SEL call back established therapy there in the past but virtual Disc calm, replika, insight timer, headspace medications apps  Obesity (BMI 30-39.9) - Plan: Amb Ref to Medical Weight Management   Early satiety could be related to mood declines GI w/u for now  Tobacco abuse - Plan: Ambulatory referral to Pulmonology  Rec cessation Ground glass opacity present on imaging of lung - Plan: Ambulatory referral to Pulmonology Abnormal CT of the chest with chronic cough-  CT scan 07/07/20 and chronic cough  IMPRESSION:  1.  No pulmonary embolism.  2.  Mild mediastinal lymphadenopathy, indeterminate. Mild mosaic attenuation in the lungs/ground glass attenuation, cannot exclude infectious or inflammatory process..     Plan: Ambulatory referral to Pulmonology Consider GI w/u GERD if no pulm etiology determined  Consider repeat CT chest   HM labs  Had cmet, cbc mag, tsh 07/07/20 care everywhere labs check lipid, A1C, vit D and B12 today w/u for wt loss clinic  Screening hep C  Flu shot 12/19/19 Tdap utd covid 19 vaccine moderna 2/2 consider 3rd dose 11/05/20 had covid 09/11/19   Pap neg 09/19/18 except trich re check; per pt had HPV vaccine  -off seasonale due to DUB/cramping   Smoker 1/4 th to < 1/2 ppd tried chantix which made her cry -as of 10/20/19 quit  FH cancer mom and dad saw genetic counselor in 2012 inconclusive results/not enough knowledge consider 2nd consultation rec healthy diet and exercise    Mammogram 06/12/19 negative   Colonoscopy age 28   Provider: Dr. Olivia Mackie McLean-Scocuzza-Internal Medicine

## 2020-07-17 ENCOUNTER — Other Ambulatory Visit: Payer: Self-pay | Admitting: Internal Medicine

## 2020-07-17 DIAGNOSIS — R7303 Prediabetes: Secondary | ICD-10-CM

## 2020-07-17 DIAGNOSIS — E538 Deficiency of other specified B group vitamins: Secondary | ICD-10-CM

## 2020-07-17 DIAGNOSIS — E559 Vitamin D deficiency, unspecified: Secondary | ICD-10-CM

## 2020-07-17 HISTORY — DX: Deficiency of other specified B group vitamins: E53.8

## 2020-07-17 LAB — LIPID PANEL
Cholesterol: 182 mg/dL (ref 0–200)
HDL: 33.7 mg/dL — ABNORMAL LOW (ref >39.00)
LDL Cholesterol: 123 mg/dL — ABNORMAL HIGH (ref 0–99)
NonHDL: 148.53
Total CHOL/HDL Ratio: 5
Triglycerides: 126 mg/dL (ref 0.0–149.0)
VLDL: 25.2 mg/dL (ref 0.0–40.0)

## 2020-07-17 LAB — RPR+HSVIGM+HBSAG+HSV2(IGG)+...
HIV Screen 4th Generation wRfx: NONREACTIVE
HSV 2 IgG, Type Spec: 11.4 index — ABNORMAL HIGH (ref 0.00–0.90)
HSVI/II Comb IgM: <0.91 Ratio (ref 0.00–0.90)
Hepatitis B Surface Ag: NEGATIVE
RPR Ser Ql: REACTIVE — AB

## 2020-07-17 LAB — RPR, QUANT. (REFLEX): Rapid Plasma Reagin, Quant: 1:1 {titer} — ABNORMAL HIGH

## 2020-07-17 LAB — HEPATITIS C ANTIBODY
Hepatitis C Ab: NONREACTIVE
SIGNAL TO CUT-OFF: 0.03 (ref <1.00)

## 2020-07-17 LAB — VITAMIN D 25 HYDROXY (VIT D DEFICIENCY, FRACTURES): VITD: 27.65 ng/mL — ABNORMAL LOW (ref 30.00–100.00)

## 2020-07-17 LAB — VITAMIN B12: Vitamin B-12: 184 pg/mL — ABNORMAL LOW (ref 211–911)

## 2020-07-17 LAB — HEMOGLOBIN A1C: Hgb A1c MFr Bld: 5.9 % (ref 4.6–6.5)

## 2020-07-17 MED ORDER — CHOLECALCIFEROL 1.25 MG (50000 UT) PO CAPS: 50000.0000 [IU] | ORAL_CAPSULE | ORAL | 2 refills | Status: DC

## 2020-07-18 NOTE — Telephone Encounter (Signed)
Error

## 2020-07-22 ENCOUNTER — Ambulatory Visit (INDEPENDENT_AMBULATORY_CARE_PROVIDER_SITE_OTHER): Payer: Managed Care, Other (non HMO)

## 2020-07-22 ENCOUNTER — Other Ambulatory Visit: Payer: Self-pay

## 2020-07-22 DIAGNOSIS — E538 Deficiency of other specified B group vitamins: Secondary | ICD-10-CM

## 2020-07-22 MED ORDER — CYANOCOBALAMIN 1000 MCG/ML IJ SOLN
1000.0000 ug | Freq: Once | INTRAMUSCULAR | Status: AC
  Administered 2020-07-22: 1000 ug via INTRAMUSCULAR

## 2020-07-22 NOTE — Progress Notes (Signed)
Patient presented for B 12 injection to left deltoid, patient voiced no concerns nor showed any signs of distress during injection. 

## 2020-07-29 ENCOUNTER — Other Ambulatory Visit: Payer: Self-pay | Admitting: Obstetrics and Gynecology

## 2020-07-29 DIAGNOSIS — A530 Latent syphilis, unspecified as early or late: Secondary | ICD-10-CM

## 2020-07-30 ENCOUNTER — Other Ambulatory Visit: Payer: Self-pay

## 2020-07-30 ENCOUNTER — Other Ambulatory Visit: Payer: Managed Care, Other (non HMO)

## 2020-07-30 DIAGNOSIS — A530 Latent syphilis, unspecified as early or late: Secondary | ICD-10-CM

## 2020-07-31 LAB — RPR: RPR Ser Ql: NONREACTIVE

## 2020-08-21 ENCOUNTER — Ambulatory Visit: Payer: Managed Care, Other (non HMO)

## 2020-08-22 ENCOUNTER — Encounter: Payer: Self-pay | Admitting: Internal Medicine

## 2020-08-23 NOTE — Telephone Encounter (Signed)
Please advise 

## 2020-08-26 ENCOUNTER — Other Ambulatory Visit: Payer: Self-pay | Admitting: Internal Medicine

## 2020-08-26 DIAGNOSIS — F410 Panic disorder [episodic paroxysmal anxiety] without agoraphobia: Secondary | ICD-10-CM

## 2020-08-26 DIAGNOSIS — F32A Depression, unspecified: Secondary | ICD-10-CM

## 2020-08-26 DIAGNOSIS — E538 Deficiency of other specified B group vitamins: Secondary | ICD-10-CM

## 2020-08-26 MED ORDER — SYRINGE/NEEDLE (DISP) 25G X 1-1/2" 1 ML MISC
1.0000 | 4 refills | Status: DC
Start: 2020-08-26 — End: 2021-10-16

## 2020-08-26 MED ORDER — ALPRAZOLAM 0.5 MG PO TABS: 0.2500 mg | ORAL_TABLET | Freq: Every day | ORAL | 2 refills | Status: DC | PRN

## 2020-08-26 MED ORDER — CYANOCOBALAMIN 1000 MCG/ML IJ SOLN: 1000.0000 ug | INTRAMUSCULAR | 4 refills | Status: DC

## 2020-09-04 ENCOUNTER — Ambulatory Visit: Payer: Managed Care, Other (non HMO) | Admitting: Pulmonary Disease

## 2020-09-04 ENCOUNTER — Other Ambulatory Visit: Payer: Self-pay

## 2020-09-04 ENCOUNTER — Encounter: Payer: Self-pay | Admitting: Pulmonary Disease

## 2020-09-04 ENCOUNTER — Ambulatory Visit (INDEPENDENT_AMBULATORY_CARE_PROVIDER_SITE_OTHER): Payer: Managed Care, Other (non HMO)

## 2020-09-04 VITALS — BP 126/84 | HR 92 | Ht 70.5 in | Wt 241.8 lb

## 2020-09-04 DIAGNOSIS — R9389 Abnormal findings on diagnostic imaging of other specified body structures: Secondary | ICD-10-CM | POA: Diagnosis not present

## 2020-09-04 NOTE — Progress Notes (Signed)
Synopsis: Referred in August 2022 for abnormal chest imaging by Orland Mustard, MD  Subjective:   PATIENT ID: Sandra Castaneda GENDER: female DOB: [birth year], MRN: [id number]  HPI  Chief Complaint  Patient presents with   Consult    Referred by PCP for history of COVID. First had covid in August 2021. Was exposed to covid again a few months ago but tested negative. She had an abnormal CT back in June 2022.     Sandra Castaneda is a 35 year old woman, daily smoker who is referred to pulmonary clinic for abnormal chest imaging.   She had covid infection last August and again this June in which she had a CTA chest which showed slight mosaic attentuation/groundglass attenuation bilaterally. Mild mediastinal lymphadenopathy.   Since her recent covid infection she reports her breathing is back to baseline and she has no limitations in her daily activities. She denies any cough, wheezing, chest tightness or shortness of breath.   She has been smoking since age 58. She was smoking a pack per day for 18 years until she recently cut down to half a pack per day over the last year. She was exposed to second hand smoke growing up as bother her parents smoked. She reports lung cancer history in her mother and maternal aunts and grandmother. She was able to quit smoking for a 10 month period with out nicotine replacement therapy or chantix/wellbutrin. She had adverse reaction to chanitx in the past.  Past Medical History:  Diagnosis Date   Anemia    Anxiety and depression    lexapro 10 mg no help, zoloft like zombie   BRCA negative    per pt report, done through [facility]   Chronic low back pain    11/05/2018 MRI L spine 3. 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   COVID-19    09/11/19   COVID-19    09/11/19   Heavy menses    Increased risk of breast cancer    IBIS=25.3%   Large breasts    Ovarian cyst 2007     Family History  Problem Relation Age of Onset    Brain cancer Mother    Hypertension Mother    Ovarian cancer Mother 90   Lung cancer Mother    Cancer Mother        ovarian to lung to brain   Headache Mother    Diabetes Mellitus I Father    Pancreatic cancer Father 22   Cancer Father        stomach cancer   Diabetes Father    Hypertension Brother    Breast cancer Maternal Aunt 68   Diabetes Mellitus II Paternal Aunt    Diabetes Mellitus II Paternal Grandmother    Breast cancer Maternal Aunt 65   Anxiety disorder Brother      Social History   Socioeconomic History   Marital status: Single    Spouse name: Not on file   Number of children: 2   Years of education: Not on file   Highest education level: Not on file  Occupational History   Not on file  Tobacco Use   Smoking status: Some Days    Packs/day: 0.25    Types: Cigarettes    Last attempt to quit: 09/2019    Years since quitting: 1.0   Smokeless tobacco: Never  Vaping Use   Vaping Use: Never used  Substance and Sexual Activity   Alcohol use: No   Drug use:  No   Sexual activity: Yes    Partners: Male    Birth control/protection: Surgical  Other Topics Concern   Not on file  Social History Narrative   DPR brother Kamayah Pillay [phone]   00 y.o [place] as of 01/13/19   2 y.o daughter as of 01/13/2019    Single    4 brothers    Smoker as of 01/13/19    Worked [employer] >[place] to 05[place] and now back working [employer] in [place] and can work at home      Lives at home with her children   Right handed   Caffeine: maybe 2 cups/day   Social Determinants of Radio broadcast assistant Strain: Not on file  Food Insecurity: Not on file  Transportation Needs: Not on file  Physical Activity: Not on file  Stress: Not on file  Social Connections: Not on file  Intimate Partner Violence: Not on file     Allergies  Allergen Reactions   Chantix [Varenicline]     Crying    Lexapro [Escitalopram]     No help    Zoloft  [Sertraline]     Zombie      Outpatient Medications Prior to Visit  Medication Sig Dispense Refill   ALPRAZolam (XANAX) 0.5 MG tablet Take 0.5-1 tablets (0.25-0.5 mg total) by mouth daily as needed for anxiety. 30 tablet 2   Cholecalciferol 1.25 MG (50000 UT) capsule Take 1 capsule (50,000 Units total) by mouth once a week. 13 capsule 2   cyanocobalamin (,VITAMIN B-12,) 1000 MCG/ML injection Inject 1 mL (1,000 mcg total) into the muscle every 30 (thirty) days. 3 mL 4   hydrOXYzine (ATARAX/VISTARIL) 25 MG tablet Take 1-2 tablets (25-50 mg total) by mouth at bedtime as needed. 60 tablet 5   rizatriptan (MAXALT) 10 MG tablet Take 1 tablet (10 mg total) by mouth as needed for migraine. May repeat in 2 hours if needed. Max 20 mg daily 10 tablet 11   Syringe/Needle, Disp, 25G X 1-1/2" 1 ML MISC 1 Device by Does not apply route every 30 (thirty) days. Q30 days with B12 shot 3 each 4   venlafaxine XR (EFFEXOR XR) 37.5 MG 24 hr capsule Take 1 capsule (37.5 mg total) by mouth daily with breakfast. 90 capsule 3   No facility-administered medications prior to visit.    Review of Systems  Constitutional:  Negative for chills, fever, malaise/fatigue and weight loss.  HENT:  Negative for congestion, sinus pain and sore throat.   Eyes: Negative.   Respiratory:  Negative for cough, hemoptysis, sputum production, shortness of breath and wheezing.   Cardiovascular:  Negative for chest pain, palpitations, orthopnea, claudication and leg swelling.  Gastrointestinal:  Negative for abdominal pain, heartburn, nausea and vomiting.  Genitourinary: Negative.   Musculoskeletal:  Negative for joint pain and myalgias.  Skin:  Negative for rash.  Neurological:  Negative for weakness.  Endo/Heme/Allergies: Negative.   Psychiatric/Behavioral: Negative.     Objective:   Vitals:   09/04/20 1601  BP: 126/84  Pulse: 92  SpO2: 99%  Weight: 241 lb 12.8 oz (109.7 kg)  Height: 5' 10.5" (1.791 m)    Physical  Exam Constitutional:      General: She is not in acute distress.    Appearance: She is not ill-appearing.  HENT:     Head: Normocephalic and atraumatic.  Eyes:     General: No scleral icterus.    Conjunctiva/sclera: Conjunctivae normal.  Pupils: Pupils are equal, round, and reactive to light.  Cardiovascular:     Rate and Rhythm: Normal rate and regular rhythm.     Pulses: Normal pulses.     Heart sounds: Normal heart sounds. No murmur heard. Pulmonary:     Effort: Pulmonary effort is normal.     Breath sounds: Normal breath sounds. No wheezing, rhonchi or rales.  Abdominal:     General: Bowel sounds are normal.     Palpations: Abdomen is soft.  Musculoskeletal:     Right lower leg: No edema.     Left lower leg: No edema.  Lymphadenopathy:     Cervical: No cervical adenopathy.  Skin:    General: Skin is warm and dry.  Neurological:     General: No focal deficit present.     Mental Status: She is alert.  Psychiatric:        Mood and Affect: Mood normal.        Behavior: Behavior normal.        Thought Content: Thought content normal.        Judgment: Judgment normal.    CBC    Component Value Date/Time   WBC 9.9 09/29/2019 1852   RBC 4.07 09/29/2019 1852   HGB 11.9 (L) 09/29/2019 1852   HGB 12.4 08/12/2013 0534   HCT 35.1 (L) 09/29/2019 1852   HCT 37.4 08/12/2013 0534   PLT 258 09/29/2019 1852   PLT 254 08/12/2013 0534   MCV 86.2 09/29/2019 1852   MCV 92 08/12/2013 0534   MCH 29.2 09/29/2019 1852   MCHC 33.9 09/29/2019 1852   RDW 13.2 09/29/2019 1852   RDW 12.7 08/12/2013 0534   LYMPHSABS 2.8 09/29/2019 1852   LYMPHSABS 3.3 08/12/2013 0534   MONOABS 0.9 09/29/2019 1852   MONOABS 1.2 (H) 08/12/2013 0534   EOSABS 0.1 09/29/2019 1852   EOSABS 0.2 08/12/2013 0534   BASOSABS 0.0 09/29/2019 1852   BASOSABS 0.1 08/12/2013 0534   Chest imaging: CT scan 07/07/20 and chronic cough (report reviewed only) IMPRESSION: 1.  No pulmonary embolism with d dimer 07/08/19  0.51 sl elevated 2.  Mild mediastinal lymphadenopathy, indeterminate. Mild mosaic attenuation in the lungs/ground glass attenuation, cannot exclude infectious or inflammatory process.  PFT: No flowsheet data found.    Assessment & Plan:   Abnormal CT of the chest - Plan: DG Chest 2 View  Discussion: Katiana Ruland is a 35 year old woman, daily smoker who is referred to pulmonary clinic for abnormal chest imaging.   Her recent CTA chest was done around the time she was sick with covid 19 so the bilateral ground glass opacities with mosaic attenuation is most likely related to her acute illness vs. Respiratory bronchiolitis - interstitial lung disease related to her smoking history.   We will repeat a chest radiograph today and if unremarkable, will hold off on further chest imaging given her lack of symptoms at this time.   We discussed smoking cessation options and I recommend that she use nicotine replacement therapy which will include 28m nicotine patches daily and nicotine lozenges as needed. She can then taper down to the 763mnicotine patch when able and then off. We discussed the importance of smoking cessation given her family history of lung cancer.   Follow up in 3 months for monitoring of her smoking cessation.   JoFreda JacksonMD [facility] Office: [phone] Current Outpatient Medications:    ALPRAZolam (XANAX) 0.5 MG tablet, Take  0.5-1 tablets (0.25-0.5 mg total) by mouth daily as needed for anxiety., Disp: 30 tablet, Rfl: 2   Cholecalciferol 1.25 MG (50000 UT) capsule, Take 1 capsule (50,000 Units total) by mouth once a week., Disp: 13 capsule, Rfl: 2   cyanocobalamin (,VITAMIN B-12,) 1000 MCG/ML injection, Inject 1 mL (1,000 mcg total) into the muscle every 30 (thirty) days., Disp: 3 mL, Rfl: 4   hydrOXYzine (ATARAX/VISTARIL) 25 MG tablet, Take 1-2 tablets (25-50 mg total) by mouth at bedtime as needed., Disp: 60 tablet, Rfl: 5    rizatriptan (MAXALT) 10 MG tablet, Take 1 tablet (10 mg total) by mouth as needed for migraine. May repeat in 2 hours if needed. Max 20 mg daily, Disp: 10 tablet, Rfl: 11   Syringe/Needle, Disp, 25G X 1-1/2" 1 ML MISC, 1 Device by Does not apply route every 30 (thirty) days. Q30 days with B12 shot, Disp: 3 each, Rfl: 4   venlafaxine XR (EFFEXOR XR) 37.5 MG 24 hr capsule, Take 1 capsule (37.5 mg total) by mouth daily with breakfast., Disp: 90 capsule, Rfl: 3

## 2020-09-04 NOTE — Patient Instructions (Signed)
Use 14mg  patch per day and then you can work down to the 7mg  patch per day when you feel your cigarette urges are under control.   You can use mini-nicotine lozenges as needed for break through cravings while on the patch.   We will check a chest x-ray today to follow up your infiltrates from covid in June.

## 2021-01-01 ENCOUNTER — Ambulatory Visit (INDEPENDENT_AMBULATORY_CARE_PROVIDER_SITE_OTHER): Payer: Managed Care, Other (non HMO) | Admitting: Family Medicine

## 2021-01-01 DIAGNOSIS — Z0289 Encounter for other administrative examinations: Secondary | ICD-10-CM

## 2021-01-01 DIAGNOSIS — Z1331 Encounter for screening for depression: Secondary | ICD-10-CM

## 2021-01-01 DIAGNOSIS — R0602 Shortness of breath: Secondary | ICD-10-CM

## 2021-01-01 DIAGNOSIS — R5383 Other fatigue: Secondary | ICD-10-CM

## 2021-01-15 ENCOUNTER — Ambulatory Visit (INDEPENDENT_AMBULATORY_CARE_PROVIDER_SITE_OTHER): Payer: Managed Care, Other (non HMO) | Admitting: Family Medicine

## 2021-05-16 ENCOUNTER — Encounter: Payer: Self-pay | Admitting: Internal Medicine

## 2021-05-16 ENCOUNTER — Ambulatory Visit (INDEPENDENT_AMBULATORY_CARE_PROVIDER_SITE_OTHER): Payer: Commercial Managed Care - PPO | Admitting: Internal Medicine

## 2021-05-16 VITALS — BP 120/60 | HR 92 | Temp 98.8°F | Resp 14 | Ht 70.5 in | Wt 250.0 lb

## 2021-05-16 DIAGNOSIS — Z1389 Encounter for screening for other disorder: Secondary | ICD-10-CM

## 2021-05-16 DIAGNOSIS — G4700 Insomnia, unspecified: Secondary | ICD-10-CM

## 2021-05-16 DIAGNOSIS — Z13220 Encounter for screening for lipoid disorders: Secondary | ICD-10-CM | POA: Diagnosis not present

## 2021-05-16 DIAGNOSIS — F339 Major depressive disorder, recurrent, unspecified: Secondary | ICD-10-CM

## 2021-05-16 DIAGNOSIS — F439 Reaction to severe stress, unspecified: Secondary | ICD-10-CM

## 2021-05-16 DIAGNOSIS — E559 Vitamin D deficiency, unspecified: Secondary | ICD-10-CM

## 2021-05-16 DIAGNOSIS — E538 Deficiency of other specified B group vitamins: Secondary | ICD-10-CM | POA: Diagnosis not present

## 2021-05-16 DIAGNOSIS — F32A Depression, unspecified: Secondary | ICD-10-CM

## 2021-05-16 DIAGNOSIS — G43911 Migraine, unspecified, intractable, with status migrainosus: Secondary | ICD-10-CM

## 2021-05-16 DIAGNOSIS — F17210 Nicotine dependence, cigarettes, uncomplicated: Secondary | ICD-10-CM

## 2021-05-16 DIAGNOSIS — R7303 Prediabetes: Secondary | ICD-10-CM

## 2021-05-16 DIAGNOSIS — F419 Anxiety disorder, unspecified: Secondary | ICD-10-CM

## 2021-05-16 DIAGNOSIS — R5383 Other fatigue: Secondary | ICD-10-CM | POA: Diagnosis not present

## 2021-05-16 DIAGNOSIS — G8929 Other chronic pain: Secondary | ICD-10-CM

## 2021-05-16 DIAGNOSIS — E611 Iron deficiency: Secondary | ICD-10-CM | POA: Diagnosis not present

## 2021-05-16 DIAGNOSIS — F410 Panic disorder [episodic paroxysmal anxiety] without agoraphobia: Secondary | ICD-10-CM

## 2021-05-16 DIAGNOSIS — E669 Obesity, unspecified: Secondary | ICD-10-CM

## 2021-05-16 DIAGNOSIS — R519 Headache, unspecified: Secondary | ICD-10-CM

## 2021-05-16 LAB — COMPREHENSIVE METABOLIC PANEL
ALT: 13 U/L (ref 0–35)
AST: 13 U/L (ref 0–37)
Albumin: 4.1 g/dL (ref 3.5–5.2)
Alkaline Phosphatase: 69 U/L (ref 39–117)
BUN: 11 mg/dL (ref 6–23)
CO2: 28 mEq/L (ref 19–32)
Calcium: 8.9 mg/dL (ref 8.4–10.5)
Chloride: 103 mEq/L (ref 96–112)
Creatinine, Ser: 0.71 mg/dL (ref 0.40–1.20)
GFR: 109.83 mL/min (ref >60.00)
Glucose, Bld: 64 mg/dL — ABNORMAL LOW (ref 70–99)
Potassium: 3.9 mEq/L (ref 3.5–5.1)
Sodium: 138 mEq/L (ref 135–145)
Total Bilirubin: 0.2 mg/dL (ref 0.2–1.2)
Total Protein: 6.6 g/dL (ref 6.0–8.3)

## 2021-05-16 LAB — IBC + FERRITIN
Ferritin: 14.9 ng/mL (ref 10.0–291.0)
Iron: 36 ug/dL — ABNORMAL LOW (ref 42–145)
Saturation Ratios: 9.5 % — ABNORMAL LOW (ref 20.0–50.0)
TIBC: 378 ug/dL (ref 250.0–450.0)
Transferrin: 270 mg/dL (ref 212.0–360.0)

## 2021-05-16 LAB — LIPID PANEL
Cholesterol: 183 mg/dL (ref 0–200)
HDL: 38.9 mg/dL — ABNORMAL LOW (ref >39.00)
LDL Cholesterol: 108 mg/dL — ABNORMAL HIGH (ref 0–99)
NonHDL: 144.57
Total CHOL/HDL Ratio: 5
Triglycerides: 181 mg/dL — ABNORMAL HIGH (ref 0.0–149.0)
VLDL: 36.2 mg/dL (ref 0.0–40.0)

## 2021-05-16 LAB — CBC WITH DIFFERENTIAL/PLATELET
Basophils Absolute: 0 10*3/uL (ref 0.0–0.1)
Basophils Relative: 0.4 % (ref 0.0–3.0)
Eosinophils Absolute: 0.2 10*3/uL (ref 0.0–0.7)
Eosinophils Relative: 1.3 % (ref 0.0–5.0)
HCT: 37.1 % (ref 36.0–46.0)
Hemoglobin: 12.4 g/dL (ref 12.0–15.0)
Lymphocytes Relative: 22.9 % (ref 12.0–46.0)
Lymphs Abs: 2.7 10*3/uL (ref 0.7–4.0)
MCHC: 33.5 g/dL (ref 30.0–36.0)
MCV: 88.3 fl (ref 78.0–100.0)
Monocytes Absolute: 0.8 10*3/uL (ref 0.1–1.0)
Monocytes Relative: 6.7 % (ref 3.0–12.0)
Neutro Abs: 8.1 10*3/uL — ABNORMAL HIGH (ref 1.4–7.7)
Neutrophils Relative %: 68.7 % (ref 43.0–77.0)
Platelets: 310 10*3/uL (ref 150.0–400.0)
RBC: 4.21 Mil/uL (ref 3.87–5.11)
RDW: 14.3 % (ref 11.5–15.5)
WBC: 11.8 10*3/uL — ABNORMAL HIGH (ref 4.0–10.5)

## 2021-05-16 LAB — VITAMIN B12: Vitamin B-12: >1504 pg/mL — ABNORMAL HIGH (ref 211–911)

## 2021-05-16 LAB — HEMOGLOBIN A1C: Hgb A1c MFr Bld: 5.8 % (ref 4.6–6.5)

## 2021-05-16 LAB — TSH: TSH: 0.84 u[IU]/mL (ref 0.35–5.50)

## 2021-05-16 LAB — VITAMIN D 25 HYDROXY (VIT D DEFICIENCY, FRACTURES): VITD: 23.63 ng/mL — ABNORMAL LOW (ref 30.00–100.00)

## 2021-05-16 MED ORDER — PHENTERMINE HCL 37.5 MG PO TABS: 18.2500 mg | ORAL_TABLET | Freq: Every day | ORAL | 0 refills | Status: DC

## 2021-05-16 MED ORDER — ALPRAZOLAM 0.5 MG PO TABS: 0.2500 mg | ORAL_TABLET | Freq: Every day | ORAL | 2 refills | Status: DC | PRN

## 2021-05-16 MED ORDER — CYANOCOBALAMIN 1000 MCG/ML IJ SOLN
1000.0000 ug | Freq: Once | INTRAMUSCULAR | Status: AC
  Administered 2021-05-16: 1000 ug via INTRAMUSCULAR

## 2021-05-16 MED ORDER — NICOTINE 21 MG/24HR TD PT24: 21.0000 mg | MEDICATED_PATCH | Freq: Every day | TRANSDERMAL | 0 refills | Status: DC

## 2021-05-16 MED ORDER — NICOTINE 14 MG/24HR TD PT24: 14.0000 mg | MEDICATED_PATCH | Freq: Every day | TRANSDERMAL | 0 refills | Status: DC

## 2021-05-16 MED ORDER — NICOTINE 7 MG/24HR TD PT24: 7.0000 mg | MEDICATED_PATCH | Freq: Every day | TRANSDERMAL | 0 refills | Status: DC

## 2021-05-16 MED ORDER — HYDROXYZINE HCL 50 MG PO TABS: 50.0000 mg | ORAL_TABLET | Freq: Every evening | ORAL | 3 refills | Status: DC | PRN

## 2021-05-16 MED ORDER — VENLAFAXINE HCL ER 37.5 MG PO CP24: 37.5000 mg | ORAL_CAPSULE | Freq: Every day | ORAL | 3 refills | Status: DC

## 2021-05-16 NOTE — Patient Instructions (Addendum)
Call insurance and see who is covered as far as psychiatry and I will place referral  ?Ask weight loss medications are covered  ? ?Dr. Jean Rosenthal due 09/18/2021 pap call for appt ? ?Phone Fax E-mail Address  ?[phone] Not available [address]  ? [place] [address]  ?   ?Specialties     ?Obstetrics and Gynecology     ? ?1800 QUIT NOW call  ? ?Managing the Challenge of Quitting Smoking ?Quitting smoking is a physical and mental challenge. You may have cravings, withdrawal symptoms, and temptation to smoke. Before quitting, work with your health care provider to make a plan that can help you manage quitting. Making a plan before you quit may keep you from smoking when you have the urge to smoke while trying to quit. ?How to manage lifestyle changes ?Managing stress ?Stress can make you want to smoke, and wanting to smoke may cause stress. It is important to find ways to manage your stress. You could try some of the following: ?Practice relaxation techniques. ?Breathe slowly and deeply, in through your nose and out through your mouth. ?Listen to music. ?Soak in a bath or take a shower. ?Imagine a peaceful place or vacation. ?Get some support. ?Talk with family or friends about your stress. ?Join a support group. ?Talk with a counselor or therapist. ?Get some physical activity. ?Go for a walk, run, or bike ride. ?Play a favorite sport. ?Practice yoga. ? ?Medicines ?Talk with your health care provider about medicines that might help you deal with cravings and make quitting easier for you. ?Relationships ?Social situations can be difficult when you are quitting smoking. To manage this, you can: ?Avoid parties and other social situations where people might be smoking. ?Avoid alcohol. ?Leave right away if you have the urge to smoke. ?Explain to your family and friends that you are quitting smoking. Ask for support and let them know you might be a bit grumpy. ?Plan activities where smoking is not an  option. ?General instructions ?Be aware that many people gain weight after they quit smoking. However, not everyone does. To keep from gaining weight, have a plan in place before you quit, and stick to the plan after you quit. Your plan should include: ?Eating healthy snacks. When you have a craving, it may help to: ?Eat popcorn, or try carrots, celery, or other cut vegetables. ?Chew sugar-free gum. ?Changing how you eat. ?Eat small portion sizes at meals. ?Eat 4-6 small meals throughout the day instead of 1-2 large meals a day. ?Be mindful when you eat. You should avoid watching television or doing other things that might distract you as you eat. ?Exercising regularly. ?Make time to exercise each day. If you do not have time for a long workout, do short bouts of exercise for 5-10 minutes several times a day. ?Do some form of strengthening exercise, such as weight lifting. ?Do some exercise that gets your heart beating and causes you to breathe deeply, such as walking fast, running, swimming, or biking. This is very important. ?Drinking plenty of water or other low-calorie or no-calorie drinks. Drink enough fluid to keep your urine pale yellow. ? ?How to recognize withdrawal symptoms ?Your body and mind may experience discomfort as you try to get used to not having nicotine in your system. These effects are called withdrawal symptoms. They may include: ?Feeling hungrier than normal. ?Having trouble concentrating. ?Feeling irritable or restless. ?Having trouble sleeping. ?Feeling depressed. ?Craving a cigarette. ?These symptoms may surprise you,  but they are normal to have when quitting smoking. ?To manage withdrawal symptoms: ?Avoid places, people, and activities that trigger your cravings. ?Remember why you want to quit. ?Get plenty of sleep. ?Avoid coffee and other drinks that contain caffeine. These may worsen some of your symptoms. ?How to manage cravings ?Come up with a plan for how to deal with your cravings.  The plan should include the following: ?A definition of the specific situation you want to deal with. ?An activity or action you will take to replace smoking. ?A clear idea for how this action will help. ?The name of someone who could help you with this. ?Cravings usually last for 5-10 minutes. Consider taking the following actions to help you with your plan to deal with cravings: ?Keep your mouth busy. ?Chew sugar-free gum. ?Suck on hard candies or a straw. ?Brush your teeth. ?Keep your hands and body busy. ?Change to a different activity right away. ?Squeeze or play with a ball. ?Do an activity or a hobby, such as making bead jewelry, practicing needlepoint, or working with wood. ?Mix up your normal routine. ?Take a short exercise break. Go for a quick walk, or run up and down stairs. ?Focus on doing something kind or helpful for someone else. ?Call a friend or family member to talk during a craving. ?Join a support group. ?Contact a quitline. ?Where to find support ?To get help or find a support group: ?Call the National Cancer Institute's Smoking Quitline: 1-800-QUIT-NOW 7807435257) ?Text QUIT to SmokefreeTXT: 673419 ?Where to find more information ?Visit these websites to find more information on quitting smoking: ?U.S. Department of Health and Human Services: www.smokefree.gov ?American Lung Association: www.freedomfromsmoking.org ?Centers for Disease Control and Prevention (CDC): FootballExhibition.com.br ?American Heart Association: www.heart.org ?Contact a health care provider if: ?You want to change your plan for quitting. ?The medicines you are taking are not helping. ?Your eating feels out of control or you cannot sleep. ?You feel depressed or become very anxious. ?Summary ?Quitting smoking is a physical and mental challenge. You will face cravings, withdrawal symptoms, and temptation to smoke again. Preparation can help you as you go through these challenges. ?Try different techniques to manage stress, handle social  situations, and prevent weight gain. ?You can deal with cravings by keeping your mouth busy (such as by chewing gum), keeping your hands and body busy, calling family or friends, or contacting a quitline for people who want to quit smoking. ?You can deal with withdrawal symptoms by avoiding places where people smoke, getting plenty of rest, and avoiding drinks that contain caffeine. ?This information is not intended to replace advice given to you by your health care provider. Make sure you discuss any questions you have with your health care provider. ?Document Revised: 01/10/2021 Document Reviewed: 01/10/2021 ?Elsevier Patient Education ? 2023 [employer] ? ?

## 2021-05-16 NOTE — Progress Notes (Signed)
Chief Complaint  ?Patient presents with  ? Weight Check  ?  Pt states she can't seem to lose wt  ? Fatigue  ?  Has no appetite.  ? ?F/u  ?1. C/o fatigue but also has iron def, vit D, B12 with FH dad and brother B12 def  ?B12 given today  ?2. Recurrent depression and anxiety need refills hydroxyzine 50 mg qhs for sleep, effexor 37.5 mg qd and xanax 0.5 takes sparingly 1/2 to 1 pill qd prn  ?She sees therapy Q2 weeks  ?Given info for psych and she will call her insurance and see who is in network  ?3. Obesity wants meds for wt loss walking 1-3 miles on treadmill  ?Agreeable to adipex  ?4. Nicotine smoking cigs 1.5 ppd this is more than normal & wants to quit given info 1800 quitnow  ? ? ? ?Review of Systems  ?Constitutional:  Positive for malaise/fatigue. Negative for weight loss.  ?HENT:  Negative for hearing loss.   ?Eyes:  Negative for blurred vision.  ?Respiratory:  Negative for shortness of breath.   ?Cardiovascular:  Negative for chest pain.  ?Gastrointestinal:  Negative for abdominal pain and blood in stool.  ?Genitourinary:  Negative for dysuria.  ?Musculoskeletal:  Negative for falls and joint pain.  ?Skin:  Negative for rash.  ?Neurological:  Negative for headaches.  ?Psychiatric/Behavioral:  Positive for depression. The patient is nervous/anxious.   ?Past Medical History:  ?Diagnosis Date  ? Anemia   ? Anxiety and depression   ? lexapro 10 mg no help, zoloft like zombie  ? BRCA negative   ? per pt report, done through [facility]  ? Chronic low back pain   ? 11/05/2018 MRI L spine 3. Minimal lower lumbar degenerative disc disease without spinal  ? COVID-19   ? 09/11/19  ? COVID-19   ? 09/11/19  ? Heavy menses   ? Increased risk of breast cancer   ? IBIS=25.3%  ? Large breasts   ? Ovarian cyst 2007  ? ?Past Surgical History:  ?Procedure Laterality Date  ? BREAST REDUCTION SURGERY Bilateral 11/06/2019  ? Procedure: BILATERAL MAMMARY REDUCTION  (BREAST);  Surgeon: Wallace Going, DO;  Location: [facility];  Service: Plastics;  Laterality: Bilateral;  ? CESAREAN SECTION  2008  ? FTP  ? CESAREAN SECTION WITH BILATERAL TUBAL LIGATION N/A 02/04/2016  ? Procedure: CESAREAN SECTION WITH BILATERAL TUBAL LIGATION;  Surgeon: Will Bonnet, MD;  Location: [facility];  Service: Obstetrics;  Laterality: N/A;  ? LAPAROTOMY  2007  ? ruptured ovarian cyst Dr. Ammie Dalton   ? ?Family History  ?Problem Relation Age of Onset  ? Brain cancer Mother   ? Hypertension Mother   ? Ovarian cancer Mother 46  ? Lung cancer Mother   ? Cancer Mother   ?     ovarian to lung to brain  ? Headache Mother   ? Diabetes Mellitus I Father   ? Pancreatic cancer Father 75  ? Cancer Father   ?     stomach cancer  ? Diabetes Father   ? Hypertension Brother   ? Breast cancer Maternal Aunt 11  ? Diabetes Mellitus II Paternal Aunt   ? Diabetes Mellitus II Paternal Grandmother   ? Breast cancer Maternal Aunt 53  ? Anxiety disorder Brother   ? ?Social History  ? ?Socioeconomic History  ? Marital status: Single  ?  Spouse name: Not on file  ? Number of children: 2  ?  Years of education: Not on file  ? Highest education level: Not on file  ?Occupational History  ? Not on file  ?Tobacco Use  ? Smoking status: Some Days  ?  Packs/day: 0.25  ?  Types: Cigarettes  ?  Last attempt to quit: 09/2019  ?  Years since quitting: 1.7  ? Smokeless tobacco: Never  ?Vaping Use  ? Vaping Use: Never used  ?Substance and Sexual Activity  ? Alcohol use: No  ? Drug use: No  ? Sexual activity: Yes  ?  Partners: Male  ?  Birth control/protection: Surgical  ?Other Topics Concern  ? Not on file  ?Social History Narrative  ? DPR brother Justyn Boyson [phone]  ? 36 y.o [place] as of 01/13/19  ? 2 y.o daughter as of 01/13/2019   ? Single   ? 4 brothers   ? Smoker as of 01/13/19   ? 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  Interested in nursing   ?   ? Lives at home with her children  ? Right handed  ? Caffeine: maybe 2 cups/day  ? ?Social Determinants of Health  ? ?Financial Resource Strain: Not on file  ?Food Insecurity: Not on file  ?Transportation Needs: Not on file  ?Physical Activity: Not on file  ?Stress: Not on file  ?Social Connections: Not on file  ?Intimate Partner Violence: Not on file  ? ?Current Meds  ?Medication Sig  ? Cholecalciferol 1.25 MG (50000 UT) capsule Take 1 capsule (50,000 Units total) by mouth once a week.  ? cyanocobalamin (,VITAMIN B-12,) 1000 MCG/ML injection Inject 1 mL (1,000 mcg total) into the muscle every 30 (thirty) days.  ? nicotine (NICODERM CQ - DOSED IN MG/24 HOURS) 14 mg/24hr patch Place 1 patch (14 mg total) onto the skin daily.  ? nicotine (NICODERM CQ - DOSED IN MG/24 HOURS) 21 mg/24hr patch Place 1 patch (21 mg total) onto the skin daily. X 1 month then taper down  ? nicotine (NICODERM CQ - DOSED IN MG/24 HR) 7 mg/24hr patch Place 1 patch (7 mg total) onto the skin daily.  ? phentermine (ADIPEX-P) 37.5 MG tablet Take 0.5-1 tablets (18.75-37.5 mg total) by mouth daily before breakfast.  ? Syringe/Needle, Disp, 25G X 1-1/2" 1 ML MISC 1 Device by Does not apply route every 30 (thirty) days. Q30 days with B12 shot  ? [DISCONTINUED] ALPRAZolam (XANAX) 0.5 MG tablet Take 0.5-1 tablets (0.25-0.5 mg total) by mouth daily as needed for anxiety.  ? [DISCONTINUED] hydrOXYzine (ATARAX/VISTARIL) 25 MG tablet Take 1-2 tablets (25-50 mg total) by mouth at bedtime as needed.  ? [DISCONTINUED] venlafaxine XR (EFFEXOR XR) 37.5 MG 24 hr capsule Take 1 capsule (37.5 mg total) by mouth daily with breakfast.  ? ?Allergies  ?Allergen Reactions  ? Chantix [Varenicline]   ?  Crying ?  ? Lexapro [Escitalopram]   ?  No help ?  ? Zoloft [Sertraline]   ?  Zombie ?  ? ?Recent Results (from the past 2160 hour(s))  ?Comprehensive metabolic panel     Status: Abnormal  ? Collection Time: 05/16/21  2:09 PM  ?Result Value Ref  Range  ? Sodium 138 135 - 145 mEq/L  ? Potassium 3.9 3.5 - 5.1 mEq/L  ? Chloride 103 96 - 112 mEq/L  ? CO2 28 19 - 32  mEq/L  ? Glucose, Bld 64 (L) 70 - 99 mg/dL  ? BUN 11 6 - 23 mg/dL  ? Creatinine, Ser 0.71 0.40 - 1.20 mg/dL  ? Total Bilirubin 0.2 0.2 - 1.2 mg/dL  ? Alkaline Phosphatase 69 39 - 117 U/L  ? AST 13 0 - 37 U/L  ? ALT 13 0 - 35 U/L  ? Total Protein 6.6 6.0 - 8.3 g/dL  ? Albumin 4.1 3.5 - 5.2 g/dL  ? GFR 109.83 >60.00 mL/min  ?  Comment: Calculated using the CKD-EPI Creatinine Equation (2021)  ? Calcium 8.9 8.4 - 10.5 mg/dL  ?Lipid panel     Status: Abnormal  ? Collection Time: 05/16/21  2:09 PM  ?Result Value Ref Range  ? Cholesterol 183 0 - 200 mg/dL  ?  Comment: ATP III Classification       Desirable:  < 200 mg/dL               Borderline High:  200 - 239 mg/dL          High:  > = 240 mg/dL  ? Triglycerides 181.0 (H) 0.0 - 149.0 mg/dL  ?  Comment: Normal:  <150 mg/dLBorderline High:  150 - 199 mg/dL  ? HDL 38.90 (L) >39.00 mg/dL  ? VLDL 36.2 0.0 - 40.0 mg/dL  ? LDL Cholesterol 108 (H) 0 - 99 mg/dL  ? Total CHOL/HDL Ratio 5   ?  Comment:                Men          Women1/2 Average Risk     3.4          3.3Average Risk          5.0          4.42X Average Risk          9.6          7.13X Average Risk          15.0          11.0                      ? NonHDL 144.57   ?  Comment: NOTE:  Non-HDL goal should be 30 mg/dL higher than patient's LDL goal (i.e. LDL goal of < 70 mg/dL, would have non-HDL goal of < 100 mg/dL)  ?CBC with Differential/Platelet     Status: Abnormal  ? Collection Time: 05/16/21  2:09 PM  ?Result Value Ref Range  ? WBC 11.8 (H) 4.0 - 10.5 K/uL  ? RBC 4.21 3.87 - 5.11 Mil/uL  ? Hemoglobin 12.4 12.0 - 15.0 g/dL  ? HCT 37.1 36.0 - 46.0 %  ? MCV 88.3 78.0 - 100.0 fl  ? MCHC 33.5 30.0 - 36.0 g/dL  ? RDW 14.3 11.5 - 15.5 %  ? Platelets 310.0 150.0 - 400.0 K/uL  ? Neutrophils Relative % 68.7 43.0 - 77.0 %  ? Lymphocytes Relative 22.9 12.0 - 46.0 %  ? Monocytes Relative 6.7 3.0 - 12.0 %  ?  Eosinophils Relative 1.3 0.0 - 5.0 %  ? Basophils Relative 0.4 0.0 - 3.0 %  ? Neutro Abs 8.1 (H) 1.4 - 7.7 K/uL  ? Lymphs Abs 2.7 0.7 - 4.0 K/uL  ? Monocytes Absolute 0.8 0.1 - 1.0 K/uL  ? Eosinophils Abs

## 2021-05-17 LAB — URINALYSIS, ROUTINE W REFLEX MICROSCOPIC
Bilirubin, UA: NEGATIVE
Glucose, UA: NEGATIVE
Ketones, UA: NEGATIVE
Leukocytes,UA: NEGATIVE
Nitrite, UA: NEGATIVE
Protein,UA: NEGATIVE
RBC, UA: NEGATIVE
Specific Gravity, UA: >=1.03 — AB (ref 1.005–1.030)
Urobilinogen, Ur: 1 mg/dL (ref 0.2–1.0)
pH, UA: 6.5 (ref 5.0–7.5)

## 2021-05-19 LAB — INTRINSIC FACTOR ANTIBODIES: Intrinsic Factor: POSITIVE — AB

## 2021-05-20 ENCOUNTER — Encounter: Payer: Self-pay | Admitting: Internal Medicine

## 2021-05-20 DIAGNOSIS — D510 Vitamin B12 deficiency anemia due to intrinsic factor deficiency: Secondary | ICD-10-CM | POA: Insufficient documentation

## 2021-05-26 ENCOUNTER — Other Ambulatory Visit: Payer: Self-pay | Admitting: Internal Medicine

## 2021-05-26 DIAGNOSIS — E538 Deficiency of other specified B group vitamins: Secondary | ICD-10-CM

## 2021-05-26 MED ORDER — CYANOCOBALAMIN 1000 MCG/ML IJ SOLN: 1000.0000 ug | INTRAMUSCULAR | 1 refills | Status: DC

## 2021-05-26 MED ORDER — "BD SYRINGE/NEEDLE 25G X 5/8"" 3 ML MISC": 12.0000 | 0 refills | Status: DC

## 2021-07-16 NOTE — Progress Notes (Deleted)
PCP:  McLean-Scocuzza, Nino Glow, MD   No chief complaint on file.    HPI:      [name] is a 36 y.o. [id number] whose LMP was No LMP recorded., presents today for her annual examination.  Her menses are regular every 28-30 days, lasting 3 days.  Dysmenorrhea {dysmen:716}. She {does:18564} have intermenstrual bleeding.  Sex activity: {sex active: 315163}.  Last Pap: 09/19/18 Results were: no abnormalities /neg HPV DNA  Hx of STDs: chlamydia, trichomonas  Last mammogram: {date:304500300}  Results were: {norm/abn:13465} There is no FH of breast cancer. There is no FH of ovarian cancer. The patient {does:18564} do self-breast exams.  Tobacco use: {tob:20664} Alcohol use: {Alcohol:11675} No drug use.  Exercise: {exercise:31265}  She {does:18564} get adequate calcium and Vitamin D in her diet.  Patient Active Problem List   Diagnosis Date Noted   Pernicious anemia 05/20/2021   Iron deficiency 05/16/2021   Fatigue 05/16/2021   B12 deficiency 07/17/2020   Prediabetes 07/17/2020   Ground glass opacity present on imaging of lung 07/16/2020   Abnormal CT of the chest 07/16/2020   Persistent cough 07/16/2020   Depression, recurrent ([place]) 01/18/2020   Chronic intractable headache 01/18/2020   Intractable migraine with status migrainosus 01/18/2020   Stress 01/18/2020   Obesity (BMI 30-39.9) 10/23/2019   Skin excoriation 08/03/2019   Tobacco abuse 04/19/2019   Hyperlipidemia 04/19/2019   Anxiety and depression 01/13/2019   Vitamin D deficiency 01/13/2019   Chronic mid back pain 01/13/2019   Chronic low back pain    Symptomatic mammary hypertrophy    Anemia    Dysmenorrhea 07/22/2016   Menorrhagia with irregular cycle 07/22/2016   Annual physical exam 02/04/2016    Past Surgical History:  Procedure Laterality Date   BREAST REDUCTION SURGERY Bilateral 11/06/2019   Procedure: BILATERAL MAMMARY REDUCTION  (BREAST);  Surgeon: Wallace Going, DO;  Location:  [facility];  Service: Plastics;  Laterality: Bilateral;   CESAREAN SECTION  2008   FTP   CESAREAN SECTION WITH BILATERAL TUBAL LIGATION N/A 02/04/2016   Procedure: CESAREAN SECTION WITH BILATERAL TUBAL LIGATION;  Surgeon: Will Bonnet, MD;  Location: [facility];  Service: Obstetrics;  Laterality: N/A;   LAPAROTOMY  2007   ruptured ovarian cyst Dr. Ammie Dalton     Family History  Problem Relation Age of Onset   Brain cancer Mother    Hypertension Mother    Ovarian cancer Mother 27   Lung cancer Mother    Cancer Mother        ovarian to lung to brain   Headache Mother    Diabetes Mellitus I Father    Pancreatic cancer Father 55   Cancer Father        stomach cancer   Diabetes Father    Hypertension Brother    Breast cancer Maternal Aunt 28   Diabetes Mellitus II Paternal Aunt    Diabetes Mellitus II Paternal Grandmother    Breast cancer Maternal Aunt 49   Anxiety disorder Brother     Social History   Socioeconomic History   Marital status: Single    Spouse name: Not on file   Number of children: 2   Years of education: Not on file   Highest education level: Not on file  Occupational History   Not on file  Tobacco Use   Smoking status: Some Days    Packs/day: 0.25    Types: Cigarettes    Last attempt to quit:  09/2019    Years since quitting: 1.8   Smokeless tobacco: Never  Vaping Use   Vaping Use: Never used  Substance and Sexual Activity   Alcohol use: No   Drug use: No   Sexual activity: Yes    Partners: Male    Birth control/protection: Surgical  Other Topics Concern   Not on file  Social History Narrative   DPR brother Zoeyjane Holstad [phone] y.o [place] as of 01/13/19   2 y.o daughter as of 01/13/2019    Single    4 brothers    Smoker as of 01/13/19    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   Interested in [place] at home with her children   Right handed   Caffeine: maybe 2 cups/day   Social Determinants of Radio broadcast assistant Strain: Not on file  Food Insecurity: Not on file  Transportation Needs: Not on file  Physical Activity: Not on file  Stress: Not on file  Social Connections: Not on file  Intimate Partner Violence: Not on file     Current Outpatient Medications:    ALPRAZolam (XANAX) 0.5 MG tablet, Take 0.5-1 tablets (0.25-0.5 mg total) by mouth daily as needed for anxiety., Disp: 30 tablet, Rfl: 2   Cholecalciferol 1.25 MG (50000 UT) capsule, Take 1 capsule (50,000 Units total) by mouth once a week., Disp: 13 capsule, Rfl: 2   cyanocobalamin (,VITAMIN B-12,) 1000 MCG/ML injection, Inject 1 mL (1,000 mcg total) into the muscle every 30 (thirty) days., Disp: 12 mL, Rfl: 1   hydrOXYzine (ATARAX) 50 MG tablet, Take 1 tablet (50 mg total) by mouth at bedtime as needed., Disp: 90 tablet, Rfl: 3   nicotine (NICODERM CQ - DOSED IN MG/24 HOURS) 14 mg/24hr patch, Place 1 patch (14 mg total) onto the skin daily., Disp: 30 patch, Rfl: 0   nicotine (NICODERM CQ - DOSED IN MG/24 HOURS) 21 mg/24hr patch, Place 1 patch (21 mg total) onto the skin daily. X 1 month then taper down, Disp: 30 patch, Rfl: 0   nicotine (NICODERM CQ - DOSED IN MG/24 HR) 7 mg/24hr patch, Place 1 patch (7 mg total) onto the skin daily., Disp: 30 patch, Rfl: 0   phentermine (ADIPEX-P) 37.5 MG tablet, Take 0.5-1 tablets (18.75-37.5 mg total) by mouth daily before breakfast., Disp: 60 tablet, Rfl: 0   rizatriptan (MAXALT) 10 MG tablet, Take 1 tablet (10 mg total) by mouth as needed for migraine. May repeat in 2 hours if needed. Max 20 mg daily (Patient not taking: Reported on 05/16/2021), Disp: 10 tablet, Rfl: 11   SYRINGE-NEEDLE, DISP, 3 ML (B-D SYRINGE/NEEDLE 3CC/25GX5/8) 25G X 5/8" 3 ML MISC, 12 Devices by Does not apply route every 30 (thirty) days., Disp: 12 each,  Rfl: 0   Syringe/Needle, Disp, 25G X 1-1/2" 1 ML MISC, 1 Device by Does not apply route every 30 (thirty) days. Q30 days with B12 shot, Disp: 3 each, Rfl: 4   venlafaxine XR (EFFEXOR XR) 37.5 MG 24 hr capsule, Take 1 capsule (37.5 mg total) by mouth daily with breakfast., Disp: 90 capsule, Rfl: 3     ROS:  Review of Systems BREAST: No symptoms   Objective: There were no vitals taken for this visit.   OBGyn Exam  Results: No results found for this or any previous visit (from the past 24 hour(s)).  Assessment/Plan: No diagnosis found.  No orders of the defined types were placed in this encounter.            GYN counsel {counseling: 16159}     F/U  No follow-ups on file.  Soo Steelman B. Caroljean Monsivais, PA-C 07/16/2021 8:09 PM

## 2021-07-17 ENCOUNTER — Ambulatory Visit: Payer: Self-pay | Admitting: Obstetrics and Gynecology

## 2021-07-17 DIAGNOSIS — Z803 Family history of malignant neoplasm of breast: Secondary | ICD-10-CM

## 2021-07-17 DIAGNOSIS — Z01419 Encounter for gynecological examination (general) (routine) without abnormal findings: Secondary | ICD-10-CM

## 2021-09-10 ENCOUNTER — Encounter (INDEPENDENT_AMBULATORY_CARE_PROVIDER_SITE_OTHER): Payer: Self-pay

## 2021-10-16 ENCOUNTER — Ambulatory Visit (INDEPENDENT_AMBULATORY_CARE_PROVIDER_SITE_OTHER): Payer: Commercial Managed Care - PPO | Admitting: Internal Medicine

## 2021-10-16 ENCOUNTER — Encounter: Payer: Self-pay | Admitting: Internal Medicine

## 2021-10-16 VITALS — BP 126/80 | HR 101 | Temp 98.4°F | Ht 70.5 in | Wt 252.4 lb

## 2021-10-16 DIAGNOSIS — Z124 Encounter for screening for malignant neoplasm of cervix: Secondary | ICD-10-CM | POA: Diagnosis not present

## 2021-10-16 DIAGNOSIS — G43911 Migraine, unspecified, intractable, with status migrainosus: Secondary | ICD-10-CM

## 2021-10-16 DIAGNOSIS — L918 Other hypertrophic disorders of the skin: Secondary | ICD-10-CM

## 2021-10-16 DIAGNOSIS — Z0000 Encounter for general adult medical examination without abnormal findings: Secondary | ICD-10-CM

## 2021-10-16 DIAGNOSIS — G8929 Other chronic pain: Secondary | ICD-10-CM

## 2021-10-16 DIAGNOSIS — E538 Deficiency of other specified B group vitamins: Secondary | ICD-10-CM

## 2021-10-16 DIAGNOSIS — Z23 Encounter for immunization: Secondary | ICD-10-CM

## 2021-10-16 DIAGNOSIS — F419 Anxiety disorder, unspecified: Secondary | ICD-10-CM | POA: Diagnosis not present

## 2021-10-16 DIAGNOSIS — E559 Vitamin D deficiency, unspecified: Secondary | ICD-10-CM

## 2021-10-16 DIAGNOSIS — F439 Reaction to severe stress, unspecified: Secondary | ICD-10-CM

## 2021-10-16 DIAGNOSIS — Z0001 Encounter for general adult medical examination with abnormal findings: Secondary | ICD-10-CM | POA: Diagnosis not present

## 2021-10-16 DIAGNOSIS — F339 Major depressive disorder, recurrent, unspecified: Secondary | ICD-10-CM

## 2021-10-16 DIAGNOSIS — F410 Panic disorder [episodic paroxysmal anxiety] without agoraphobia: Secondary | ICD-10-CM

## 2021-10-16 DIAGNOSIS — G4700 Insomnia, unspecified: Secondary | ICD-10-CM

## 2021-10-16 DIAGNOSIS — F32A Depression, unspecified: Secondary | ICD-10-CM

## 2021-10-16 MED ORDER — ALPRAZOLAM 0.5 MG PO TABS: 0.2500 mg | ORAL_TABLET | Freq: Every day | ORAL | 2 refills | Status: AC | PRN

## 2021-10-16 MED ORDER — HYDROXYZINE HCL 50 MG PO TABS: 50.0000 mg | ORAL_TABLET | Freq: Every evening | ORAL | 3 refills | Status: AC | PRN

## 2021-10-16 MED ORDER — CYANOCOBALAMIN 1000 MCG/ML IJ SOLN: 1000.0000 ug | INTRAMUSCULAR | 1 refills | Status: DC

## 2021-10-16 MED ORDER — VENLAFAXINE HCL ER 75 MG PO CP24: 75.0000 mg | ORAL_CAPSULE | Freq: Every day | ORAL | 3 refills | Status: DC

## 2021-10-16 MED ORDER — TIZANIDINE HCL 4 MG PO TABS: 4.0000 mg | ORAL_TABLET | Freq: Every evening | ORAL | 0 refills | Status: DC | PRN

## 2021-10-16 MED ORDER — "SYRINGE/NEEDLE (DISP) 25G X 1-1/2"" 1 ML MISC": 1.0000 | 4 refills | Status: AC

## 2021-10-16 MED ORDER — NURTEC 75 MG PO TBDP: 1.0000 | ORAL_TABLET | Freq: Every day | ORAL | 11 refills | Status: DC

## 2021-10-16 MED ORDER — CHOLECALCIFEROL 1.25 MG (50000 UT) PO CAPS: 50000.0000 [IU] | ORAL_CAPSULE | ORAL | 2 refills | Status: DC

## 2021-10-16 NOTE — Progress Notes (Signed)
Chief Complaint  Patient presents with   Follow-up    4-6 week follow up   Annual Exam   Annual 1. Chronic intermittent h/a right eye/side of face and radiating to right neck mri in 2021 negative established neurology Dr. Jaynee Eagles  Nerve blocks in the past have not worked and tried [employer] 2 right neck mole irritated and bleeding new change  3. Anxiety/depression rec psych [facility] is too costly on effexor 37.5 mg qd prn xanax and atarax  4. Flu shot today   Review of Systems  Constitutional:  Negative for weight loss.  HENT:  Negative for hearing loss.   Eyes:  Negative for blurred vision.  Respiratory:  Negative for shortness of breath.   Cardiovascular:  Negative for chest pain.  Gastrointestinal:  Negative for abdominal pain and blood in stool.  Genitourinary:  Negative for dysuria.  Musculoskeletal:  Positive for neck pain. Negative for falls and joint pain.  Skin:  Negative for rash.  Neurological:  Positive for headaches.  Psychiatric/Behavioral:  Negative for depression.    Past Medical History:  Diagnosis Date   Anemia    Anxiety and depression    lexapro 10 mg no help, zoloft like zombie   BRCA negative    per pt report, done through [facility]   Chronic low back pain    11/05/2018 MRI L spine 3. Minimal lower lumbar degenerative disc disease without spinal   COVID-19    09/11/19   COVID-19    09/11/19   Heavy menses    Increased risk of breast cancer    IBIS=25.3%   Large breasts    Ovarian cyst 2007   Past Surgical History:  Procedure Laterality Date   BREAST REDUCTION SURGERY Bilateral 11/06/2019   Procedure: BILATERAL MAMMARY REDUCTION  (BREAST);  Surgeon: Wallace Going, DO;  Location: [facility];  Service: Plastics;  Laterality: Bilateral;   CESAREAN SECTION  2008   FTP   CESAREAN SECTION WITH BILATERAL TUBAL LIGATION N/A 02/04/2016   Procedure: CESAREAN SECTION WITH BILATERAL TUBAL LIGATION;  Surgeon: Will Bonnet, MD;  Location:  [facility];  Service: Obstetrics;  Laterality: N/A;   LAPAROTOMY  2007   ruptured ovarian cyst Dr. Ammie Dalton    Family History  Problem Relation Age of Onset   Brain cancer Mother    Hypertension Mother    Ovarian cancer Mother 13   Lung cancer Mother    Cancer Mother        ovarian to lung to brain   Headache Mother    Diabetes Mellitus I Father    Pancreatic cancer Father 60   Cancer Father        stomach cancer   Diabetes Father    Hypertension Brother    Breast cancer Maternal Aunt 2   Diabetes Mellitus II Paternal Aunt    Diabetes Mellitus II Paternal Grandmother    Breast cancer Maternal Aunt 64   Anxiety disorder Brother    Social History   Socioeconomic History   Marital status: Single    Spouse name: Not on file   Number of children: 2   Years of education: Not on file   Highest education level: Not on file  Occupational History   Not on file  Tobacco Use   Smoking status: Some Days    Packs/day: 0.25    Types: Cigarettes    Last attempt to quit: 09/2019    Years since quitting: 2.1   Smokeless tobacco: Never  Vaping Use   Vaping Use: Never used  Substance and Sexual Activity   Alcohol use: No   Drug use: No   Sexual activity: Yes    Partners: Male    Birth control/protection: Surgical  Other Topics Concern   Not on file  Social History Narrative   DPR brother Ajeenah Heiny [phone]   2 y.o [place] as of 01/13/19   2 y.o daughter as of 01/13/2019    Single    4 brothers    Smoker as of 01/13/19    Worked [place] [employer]ology >[place] and now back working [employer] in [place] and 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   nicotine (NICODERM CQ - DOSED IN MG/24 HOURS) 21 mg/24hr patch Place 1 patch (21 mg total) onto the skin daily. X 1 month then taper down   nicotine (NICODERM CQ - DOSED IN MG/24 HR) 7 mg/24hr patch Place 1 patch (7 mg total) onto the skin daily.   Rimegepant Sulfate (NURTEC) 75 MG TBDP Take 1 tablet by mouth daily. Prn migraine failed maxalt and nerve block injections   SYRINGE-NEEDLE, DISP, 3 ML (B-D SYRINGE/NEEDLE 3CC/25GX5/8) 25G X 5/8" 3 ML MISC 12 Devices by Does not apply route every 30 (thirty) days.   tiZANidine (ZANAFLEX) 4 MG tablet Take 1 tablet (4 mg total) by mouth at bedtime as needed for muscle spasms.   [DISCONTINUED] ALPRAZolam (XANAX) 0.5 MG tablet Take 0.5-1 tablets (0.25-0.5 mg total) by mouth daily as needed for anxiety.   [DISCONTINUED] Cholecalciferol 1.25 MG (50000 UT) capsule Take 1 capsule (50,000 Units total) by mouth once a week.   [DISCONTINUED] cyanocobalamin (,VITAMIN B-12,) 1000 MCG/ML injection Inject 1 mL (1,000 mcg total) into the muscle every 30 (thirty) days.   [DISCONTINUED] hydrOXYzine (ATARAX) 50 MG tablet Take 1 tablet (50 mg total) by mouth at bedtime as needed.   [DISCONTINUED] Syringe/Needle, Disp, 25G X 1-1/2" 1 ML MISC 1 Device by Does not apply route every 30 (thirty) days. Q30 days with B12 shot   [DISCONTINUED] venlafaxine XR (EFFEXOR XR) 37.5 MG 24 hr capsule Take 1 capsule (37.5 mg total) by mouth daily with breakfast.   Allergies  Allergen Reactions   Chantix [Varenicline]     Crying    Lexapro [Escitalopram]     No help    Zoloft [Sertraline]     Zombie    No results found for this or any previous visit (from the past 2160 hour(s)). Objective  Body mass index  is 35.7 kg/m. Wt Readings from Last 3 Encounters:  10/16/21 252 lb 6.4 oz (114.5 kg)  05/16/21 250 lb (113.4 kg)  09/04/20 241 lb 12.8 oz (109.7 kg)   Temp Readings from Last 3 Encounters:  10/16/21 98.4 F (36.9 C) (Oral)  05/16/21 98.8  F (37.1 C) (Oral)  07/16/20 97.8 F (36.6 C) (Oral)   BP Readings from Last 3 Encounters:  10/16/21 126/80  05/16/21 120/60  09/04/20 126/84   Pulse Readings from Last 3 Encounters:  10/16/21 (!) 101  05/16/21 92  09/04/20 92    Physical Exam Vitals and nursing note reviewed.  Constitutional:      Appearance: Normal appearance. She is well-developed and well-groomed.  HENT:     Head: Normocephalic and atraumatic.  Eyes:     Conjunctiva/sclera: Conjunctivae normal.     Pupils: Pupils are equal, round, and reactive to light.  Cardiovascular:     Rate and Rhythm: Normal rate and regular rhythm.     Heart sounds: Normal heart sounds. No murmur heard. Pulmonary:     Effort: Pulmonary effort is normal.     Breath sounds: Normal breath sounds.  Abdominal:     General: Abdomen is flat. Bowel sounds are normal.     Tenderness: There is no abdominal tenderness.  Musculoskeletal:        General: No tenderness.  Skin:    General: Skin is warm and dry.  Neurological:     General: No focal deficit present.     Mental Status: She is alert and oriented to person, place, and time. Mental status is at baseline.     Cranial Nerves: Cranial nerves 2-12 are intact.     Motor: Motor function is intact.     Coordination: Coordination is intact.     Gait: Gait is intact.  Psychiatric:        Attention and Perception: Attention and perception normal.        Mood and Affect: Mood and affect normal.        Speech: Speech normal.        Behavior: Behavior normal. Behavior is cooperative.        Thought Content: Thought content normal.        Cognition and Memory: Cognition and memory normal.        Judgment: Judgment normal.     Assessment   Plan  Annual physical exam See ortho   Chronic intractable headache, unspecified headache type - Plan: Ambulatory referral to Neurology, venlafaxine XR (EFFEXOR XR) 75 MG 24 hr capsule, tiZANidine (ZANAFLEX) 4 MG tablet, Rimegepant Sulfate (NURTEC) 75 MG TBDP  Intractable migraine with status migrainosus, unspecified migraine type - Plan: Ambulatory referral to Neurology, venlafaxine XR (EFFEXOR XR) 75 MG 24 hr capsule, Rimegepant Sulfate (NURTEC) 75 MG TBDP  Skin tag - Plan: Ambulatory referral to Dermatology  Routine cervical smear - Plan: Ambulatory referral to Obstetrics / Gynecology Dr Glennon Mac  Anxiety and depression - Plan: venlafaxine XR (EFFEXOR XR) 75 MG 24 hr capsule, ALPRAZolam (XANAX) 0.5 MG tablet  Panic attack - Plan: venlafaxine XR (EFFEXOR XR) 75 MG 24 hr capsule, ALPRAZolam (XANAX) 0.5 MG tablet  Depression, recurrent ([place]) - Plan: venlafaxine XR (EFFEXOR XR) 75 MG 24 hr capsule  Stress - Plan: venlafaxine XR (EFFEXOR XR) 75 MG 24 hr capsule  Insomnia, unspecified type - Plan: hydrOXYzine (ATARAX) 50 MG tablet  B12 deficiency - Plan: cyanocobalamin (VITAMIN B12) 1000 MCG/ML injection, Syringe/Needle, Disp, 25G X 1-1/2" 1 ML MISC  Vitamin D deficiency - Plan: Cholecalciferol 1.25 MG (50000 UT) capsule    HM-Annual Flu shot given today Tdap utd covid 19 vaccine moderna 2/2 consider 3rd dose 11/05/20 had covid 09/11/19   Pap neg 09/19/18 except trich re check; per pt had HPV vaccine  -off  seasonale due to DUB/cramping Given # to call Dr. Glennon Mac and make appt 09/2021    Smoker 1/4 th to < 1/2 ppd tried chantix which made her cry -as of 05/16/21 smoking >1.5 ppd   FH cancer mom and dad saw genetic counselor in 2012 inconclusive results/not enough knowledge consider 2nd consultation rec healthy diet and exercise    Mammogram 06/12/19 negative   Colonoscopy age 18    Rec healthy diet and exercise  Provider: Dr. Olivia Mackie McLean-Scocuzza-Internal Medicine

## 2021-10-16 NOTE — Patient Instructions (Addendum)
Dr. Thomasene Mohair call for pap smear you are due  Phone Fax E-mail Address  [phone] Not available [address]   [place] [address]     Specialties     Obstetrics and Gynecology      Sherrin Daisy, MD 4.0 4 Google reviews Dermatologist in [place], Ashley [place] Get online care: Learn more Address: [address], [place], [place] [address] Hours:  Open ? Closes 5?PM Products and Services: [email or web address] Phone: [phone]   Naomie Dean, MD [facility] [address] [place], [place] [address]   Phone [phone] Fax [phone]  F/u neurology  Take daily magnesium 250 mg daily, b complex vitamin   6-8 hours of sleep  Water 55-64 ounces daily  Reduce/avoid sugar of caffeine    Migraine Headache A migraine headache is an intense, throbbing pain on one side or both sides of the head. Migraine headaches may also cause other symptoms, such as nausea, vomiting, and sensitivity to light and noise. A migraine headache can last from 4 hours to 3 days. Talk with your doctor about what things may bring on (trigger) your migraine headaches. What are the causes? The exact cause of this condition is not known. However, a migraine may be caused when nerves in the brain become irritated and release chemicals that cause inflammation of blood vessels. This inflammation causes pain. This condition may be triggered or caused by: Drinking alcohol. Smoking. Taking medicines, such as: Medicine used to treat chest pain (nitroglycerin). Birth control pills. Estrogen. Certain blood pressure medicines. Eating or drinking products that contain nitrates, glutamate, aspartame, or tyramine. Aged cheeses, chocolate, or caffeine may also be triggers. Doing physical activity. Other things that may trigger a migraine headache include: Menstruation. Pregnancy. Hunger. Stress. Lack of sleep or too much sleep. Weather  changes. Fatigue. What increases the risk? The following factors may make you more likely to experience migraine headaches: Being a certain age. This condition is more common in people who are 45-103 years old. Being female. Having a family history of migraine headaches. Being Caucasian. Having a mental health condition, such as depression or anxiety. Being obese. What are the signs or symptoms? The main symptom of this condition is pulsating or throbbing pain. This pain may: Happen in any area of the head, such as on one side or both sides. Interfere with daily activities. Get worse with physical activity. Get worse with exposure to bright lights or loud noises. Other symptoms may include: Nausea. Vomiting. Dizziness. General sensitivity to bright lights, loud noises, or smells. Before you get a migraine headache, you may get warning signs (an aura). An aura may include: Seeing flashing lights or having blind spots. Seeing bright spots, halos, or zigzag lines. Having tunnel vision or blurred vision. Having numbness or a tingling feeling. Having trouble talking. Having muscle weakness. Some people have symptoms after a migraine headache (postdromal phase), such as: Feeling tired. Difficulty concentrating. How is this diagnosed? A migraine headache can be diagnosed based on: Your symptoms. A physical exam. Tests, such as: CT scan or an MRI of the head. These imaging tests can help rule out other causes of headaches. Taking fluid from the spine (lumbar puncture) and analyzing it (cerebrospinal fluid analysis, or CSF analysis). How is this treated? This condition may be treated with medicines that: Relieve pain. Relieve nausea. Prevent migraine headaches. Treatment for this condition may also include: Acupuncture. Lifestyle changes like avoiding foods that trigger migraine headaches. Biofeedback. Cognitive behavioral therapy.  Follow these instructions at  home: Medicines Take over-the-counter and prescription medicines only as told by your health care provider. Ask your health care provider if the medicine prescribed to you: Requires you to avoid driving or using heavy machinery. Can cause constipation. You may need to take these actions to prevent or treat constipation: Drink enough fluid to keep your urine pale yellow. Take over-the-counter or prescription medicines. Eat foods that are high in fiber, such as beans, whole grains, and fresh fruits and vegetables. Limit foods that are high in fat and processed sugars, such as fried or sweet foods. Lifestyle Do not drink alcohol. Do not use any products that contain nicotine or tobacco, such as cigarettes, e-cigarettes, and chewing tobacco. If you need help quitting, ask your health care provider. Get at least 8 hours of sleep every night. Find ways to manage stress, such as meditation, deep breathing, or yoga. General instructions     Keep a journal to find out what may trigger your migraine headaches. For example, write down: What you eat and drink. How much sleep you get. Any change to your diet or medicines. If you have a migraine headache: Avoid things that make your symptoms worse, such as bright lights. It may help to lie down in a dark, quiet room. Do not drive or use heavy machinery. Ask your health care provider what activities are safe for you while you are experiencing symptoms. Keep all follow-up visits as told by your health care provider. This is important. Contact a health care provider if: You develop symptoms that are different or more severe than your usual migraine headache symptoms. You have more than 15 headache days in one month. Get help right away if: Your migraine headache becomes severe. Your migraine headache lasts longer than 72 hours. You have a fever. You have a stiff neck. You have vision loss. Your muscles feel weak or like you cannot control  them. You start to lose your balance often. You have trouble walking. You faint. You have a seizure. Summary A migraine headache is an intense, throbbing pain on one side or both sides of the head. Migraines may also cause other symptoms, such as nausea, vomiting, and sensitivity to light and noise. This condition may be treated with medicines and lifestyle changes. You may also need to avoid certain things that trigger a migraine headache. Keep a journal to find out what may trigger your migraine headaches. Contact your health care provider if you have more than 15 headache days in a month or you develop symptoms that are different or more severe than your usual migraine headache symptoms. This information is not intended to replace advice given to you by your health care provider. Make sure you discuss any questions you have with your health care provider. Document Revised: 05/13/2018 Document Reviewed: 03/03/2018 Elsevier Patient Education  2023 [employer].

## 2021-11-20 ENCOUNTER — Encounter: Payer: Self-pay | Admitting: Family Medicine

## 2021-11-25 ENCOUNTER — Telehealth: Payer: Self-pay

## 2021-11-25 NOTE — Telephone Encounter (Signed)
Spoke to Patient she needed her Flu vaccine form. Form is printed and put up front in Dr. Volanda Napoleon folder for Patient to pick up.

## 2022-03-04 ENCOUNTER — Ambulatory Visit (INDEPENDENT_AMBULATORY_CARE_PROVIDER_SITE_OTHER): Payer: BC Managed Care – PPO | Admitting: Nurse Practitioner

## 2022-03-04 ENCOUNTER — Ambulatory Visit (INDEPENDENT_AMBULATORY_CARE_PROVIDER_SITE_OTHER): Payer: BC Managed Care – PPO

## 2022-03-04 ENCOUNTER — Encounter: Payer: Self-pay | Admitting: Nurse Practitioner

## 2022-03-04 VITALS — BP 128/70 | HR 96 | Temp 98.4°F | Ht 70.5 in | Wt 265.0 lb

## 2022-03-04 DIAGNOSIS — E785 Hyperlipidemia, unspecified: Secondary | ICD-10-CM

## 2022-03-04 DIAGNOSIS — E669 Obesity, unspecified: Secondary | ICD-10-CM

## 2022-03-04 DIAGNOSIS — G8929 Other chronic pain: Secondary | ICD-10-CM | POA: Diagnosis not present

## 2022-03-04 DIAGNOSIS — D649 Anemia, unspecified: Secondary | ICD-10-CM | POA: Diagnosis not present

## 2022-03-04 DIAGNOSIS — Z1329 Encounter for screening for other suspected endocrine disorder: Secondary | ICD-10-CM | POA: Diagnosis not present

## 2022-03-04 DIAGNOSIS — M542 Cervicalgia: Secondary | ICD-10-CM | POA: Diagnosis not present

## 2022-03-04 DIAGNOSIS — E559 Vitamin D deficiency, unspecified: Secondary | ICD-10-CM

## 2022-03-04 MED ORDER — SEMAGLUTIDE-WEIGHT MANAGEMENT 2.4 MG/0.75ML ~~LOC~~ SOAJ: 2.4000 mg | SUBCUTANEOUS | 0 refills | Status: DC

## 2022-03-04 MED ORDER — SEMAGLUTIDE-WEIGHT MANAGEMENT 1 MG/0.5ML ~~LOC~~ SOAJ: 1.0000 mg | SUBCUTANEOUS | 0 refills | Status: DC

## 2022-03-04 MED ORDER — SEMAGLUTIDE-WEIGHT MANAGEMENT 0.25 MG/0.5ML ~~LOC~~ SOAJ: 0.2500 mg | SUBCUTANEOUS | 0 refills | Status: AC

## 2022-03-04 MED ORDER — SEMAGLUTIDE-WEIGHT MANAGEMENT 0.5 MG/0.5ML ~~LOC~~ SOAJ: 0.5000 mg | SUBCUTANEOUS | 0 refills | Status: AC

## 2022-03-04 MED ORDER — CYCLOBENZAPRINE HCL 5 MG PO TABS: 5.0000 mg | ORAL_TABLET | Freq: Three times a day (TID) | ORAL | 0 refills | Status: DC | PRN

## 2022-03-04 MED ORDER — SEMAGLUTIDE-WEIGHT MANAGEMENT 1.7 MG/0.75ML ~~LOC~~ SOAJ: 1.7000 mg | SUBCUTANEOUS | 0 refills | Status: DC

## 2022-03-04 NOTE — Assessment & Plan Note (Signed)
X-ray today in office. Will trial Flexeril to see if patient gets any relief in symptoms. Area is very tight/tense on exam. Encouraged relaxation techniques, massage, and correcting posture when sitting at desk for work. Discussed possibility of physical therapy if symptoms persist.

## 2022-03-04 NOTE — Assessment & Plan Note (Addendum)
Chronic. Lab work as outlined. Continue B12 injections. Will monitor.

## 2022-03-04 NOTE — Assessment & Plan Note (Signed)
Continue OTC daily. Will check Vitamin D today.

## 2022-03-04 NOTE — Assessment & Plan Note (Signed)
Will check A1c today. History of prediabetes. Will try [facility] for help with weight loss, pending insurance approval. Encouraged to continue healthy diet and exercise.

## 2022-03-04 NOTE — Progress Notes (Signed)
Sandra Morrow, Sandra Castaneda Phone: [phone]  Sandra Castaneda is a 37 y.o. female who presents today for neck pain.   She has a history of chronic next pain. She has seen a brain and spine specialist in the past. She was previously put on a muscle relaxer without any relief in symptoms. She reports increased pain with turning her head to the right. She reports the pain as a tightness feeling that starts in her lower neck and goes up into her skull, mostly on the right side. It does cause headaches. She had a negative MRI in 2021. Nerve blocks in the past have also not worked. Denies any injury.   She also reports concerns about recent weight gain. She reports monitoring her diet and eating smaller meals but is continuing to gain weight. She has been on Phentermine in the past but would like to try something else. She has gained 13 lbs since her last visit in September. She reports walking a lot and is trying to get back into the gym. She has cut out soda.   Social History   Tobacco Use  Smoking Status Some Days   Packs/day: 0.25   Types: Cigarettes   Last attempt to quit: 09/2019   Years since quitting: 2.5  Smokeless Tobacco Never    Current Outpatient Medications on File Prior to Visit  Medication Sig Dispense Refill   ALPRAZolam (XANAX) 0.5 MG tablet Take 0.5-1 tablets (0.25-0.5 mg total) by mouth daily as needed for anxiety. (Patient taking differently: Take 0.25-0.5 mg by mouth daily as needed for anxiety (pt taking 2 full tablets a day).) 30 tablet 2   Cholecalciferol 1.25 MG (50000 UT) capsule Take 1 capsule (50,000 Units total) by mouth once a week. 13 capsule 2   cyanocobalamin (VITAMIN B12) 1000 MCG/ML injection Inject 1 mL (1,000 mcg total) into the muscle every 30 (thirty) days. 12 mL 1   hydrOXYzine (ATARAX) 50 MG tablet Take 1 tablet (50 mg total) by mouth at bedtime as needed. 90 tablet 3   SYRINGE-NEEDLE, DISP, 3 ML (B-D SYRINGE/NEEDLE 3CC/25GX5/8) 25G X 5/8" 3 ML MISC 12  Devices by Does not apply route every 30 (thirty) days. 12 each 0   Syringe/Needle, Disp, 25G X 1-1/2" 1 ML MISC 1 Device by Does not apply route every 30 (thirty) days. Q30 days with B12 shot 3 each 4   No current facility-administered medications on file prior to visit.     ROS see history of present illness  Objective  Physical Exam Vitals:   03/04/22 1555  BP: 128/70  Pulse: 96  Temp: 98.4 F (36.9 C)  SpO2: 98%    BP Readings from Last 3 Encounters:  03/04/22 128/70  10/16/21 126/80  05/16/21 120/60   Wt Readings from Last 3 Encounters:  03/04/22 265 lb (120.2 kg)  10/16/21 252 lb 6.4 oz (114.5 kg)  05/16/21 250 lb (113.4 kg)    Physical Exam Constitutional:      General: She is not in acute distress.    Appearance: Normal appearance. She is obese.  HENT:     Head: Normocephalic.  Cardiovascular:     Rate and Rhythm: Normal rate and regular rhythm.     Heart sounds: Normal heart sounds.  Pulmonary:     Effort: Pulmonary effort is normal.     Breath sounds: Normal breath sounds.  Musculoskeletal:     Cervical back: Normal range of motion. Pain with movement (turning to the right) and muscular tenderness present.  Skin:    General: Skin is warm and dry.  Neurological:     General: No focal deficit present.     Mental Status: She is alert.  Psychiatric:        Mood and Affect: Mood normal.        Behavior: Behavior normal.    Assessment/Plan: Please see individual problem list.  Neck pain, chronic Assessment & Plan: X-ray today in office. Will trial Flexeril to see if patient gets any relief in symptoms. Area is very tight/tense on exam. Encouraged relaxation techniques, massage, and correcting posture when sitting at desk for work. Discussed possibility of physical therapy if symptoms persist.   Orders: -     DG Cervical Spine Complete; Future -     Cyclobenzaprine HCl; Take 1-2 tablets (5-10 mg total) by mouth 3 (three) times daily as needed for  muscle spasms.  Dispense: 30 tablet; Refill: 0  Obesity (BMI 30-39.9) Assessment & Plan: Will check A1c today. History of prediabetes. Will try [facility] for help with weight loss, pending insurance approval. Encouraged to continue healthy diet and exercise.   Orders: -     Hemoglobin A1c  Hyperlipidemia, unspecified hyperlipidemia type Assessment & Plan: Chronic. Will check lipids today. Not currently on medication, trying to control with diet.   Orders: -     Comprehensive metabolic panel -     Lipid panel  Anemia, unspecified type Assessment & Plan: Chronic. Lab work as outlined. Continue B12 injections. Will monitor.  Orders: -     CBC with Differential/Platelet -     IBC + Ferritin -     B12 and Folate Panel  Vitamin D deficiency Assessment & Plan: Continue OTC daily. Will check Vitamin D today.   Orders: -     VITAMIN D 25 Hydroxy (Vit-D Deficiency, Fractures)  Thyroid disorder screen -     TSH   Return in about 2 months (around 05/18/2022), or if symptoms worsen or fail to improve.   Sandra Morrow, Sandra Castaneda [place]

## 2022-03-04 NOTE — Assessment & Plan Note (Signed)
Chronic. Will check lipids today. Not currently on medication, trying to control with diet.

## 2022-03-05 ENCOUNTER — Encounter: Payer: Self-pay | Admitting: Nurse Practitioner

## 2022-03-05 LAB — CBC WITH DIFFERENTIAL/PLATELET
Basophils Absolute: 0.1 10*3/uL (ref 0.0–0.1)
Basophils Relative: 0.7 % (ref 0.0–3.0)
Eosinophils Absolute: 0.2 10*3/uL (ref 0.0–0.7)
Eosinophils Relative: 1.9 % (ref 0.0–5.0)
HCT: 36.8 % (ref 36.0–46.0)
Hemoglobin: 12.4 g/dL (ref 12.0–15.0)
Lymphocytes Relative: 28.6 % (ref 12.0–46.0)
Lymphs Abs: 2.7 10*3/uL (ref 0.7–4.0)
MCHC: 33.8 g/dL (ref 30.0–36.0)
MCV: 87.5 fl (ref 78.0–100.0)
Monocytes Absolute: 0.6 10*3/uL (ref 0.1–1.0)
Monocytes Relative: 6.9 % (ref 3.0–12.0)
Neutro Abs: 5.8 10*3/uL (ref 1.4–7.7)
Neutrophils Relative %: 61.9 % (ref 43.0–77.0)
Platelets: 297 10*3/uL (ref 150.0–400.0)
RBC: 4.21 Mil/uL (ref 3.87–5.11)
RDW: 14.1 % (ref 11.5–15.5)
WBC: 9.4 10*3/uL (ref 4.0–10.5)

## 2022-03-05 LAB — B12 AND FOLATE PANEL
Folate: 19.1 ng/mL (ref >5.9)
Vitamin B-12: 421 pg/mL (ref 211–911)

## 2022-03-05 LAB — COMPREHENSIVE METABOLIC PANEL
ALT: 19 U/L (ref 0–35)
AST: 15 U/L (ref 0–37)
Albumin: 4 g/dL (ref 3.5–5.2)
Alkaline Phosphatase: 58 U/L (ref 39–117)
BUN: 7 mg/dL (ref 6–23)
CO2: 28 mEq/L (ref 19–32)
Calcium: 8.8 mg/dL (ref 8.4–10.5)
Chloride: 106 mEq/L (ref 96–112)
Creatinine, Ser: 0.74 mg/dL (ref 0.40–1.20)
GFR: 103.92 mL/min (ref >60.00)
Glucose, Bld: 109 mg/dL — ABNORMAL HIGH (ref 70–99)
Potassium: 3.9 mEq/L (ref 3.5–5.1)
Sodium: 141 mEq/L (ref 135–145)
Total Bilirubin: 0.2 mg/dL (ref 0.2–1.2)
Total Protein: 6.9 g/dL (ref 6.0–8.3)

## 2022-03-05 LAB — VITAMIN D 25 HYDROXY (VIT D DEFICIENCY, FRACTURES): VITD: 34.77 ng/mL (ref 30.00–100.00)

## 2022-03-05 LAB — LIPID PANEL
Cholesterol: 177 mg/dL (ref 0–200)
HDL: 37.3 mg/dL — ABNORMAL LOW (ref >39.00)
LDL Cholesterol: 106 mg/dL — ABNORMAL HIGH (ref 0–99)
NonHDL: 139.71
Total CHOL/HDL Ratio: 5
Triglycerides: 170 mg/dL — ABNORMAL HIGH (ref 0.0–149.0)
VLDL: 34 mg/dL (ref 0.0–40.0)

## 2022-03-05 LAB — IBC + FERRITIN
Ferritin: 10.8 ng/mL (ref 10.0–291.0)
Iron: 38 ug/dL — ABNORMAL LOW (ref 42–145)
Saturation Ratios: 10.7 % — ABNORMAL LOW (ref 20.0–50.0)
TIBC: 355.6 ug/dL (ref 250.0–450.0)
Transferrin: 254 mg/dL (ref 212.0–360.0)

## 2022-03-05 LAB — HEMOGLOBIN A1C: Hgb A1c MFr Bld: 5.9 % (ref 4.6–6.5)

## 2022-03-05 LAB — TSH: TSH: 1.47 u[IU]/mL (ref 0.35–5.50)

## 2022-03-06 ENCOUNTER — Other Ambulatory Visit: Payer: Self-pay | Admitting: Nurse Practitioner

## 2022-03-06 DIAGNOSIS — G8929 Other chronic pain: Secondary | ICD-10-CM

## 2022-03-06 MED ORDER — METHOCARBAMOL 500 MG PO TABS: 500.0000 mg | ORAL_TABLET | Freq: Four times a day (QID) | ORAL | 0 refills | Status: DC | PRN

## 2022-04-03 ENCOUNTER — Other Ambulatory Visit (HOSPITAL_COMMUNITY): Payer: Self-pay

## 2022-04-06 ENCOUNTER — Other Ambulatory Visit (HOSPITAL_COMMUNITY): Payer: Self-pay

## 2022-04-07 ENCOUNTER — Telehealth: Payer: Self-pay

## 2022-04-07 NOTE — Telephone Encounter (Signed)
Pharmacy Patient Advocate Encounter   Received notification that prior authorization for [facility] is required/requested.   PA submitted on 04/07/22 to (ins) [place] via CoverMyMeds Key  #  [id number] Status is pending

## 2022-04-16 NOTE — Telephone Encounter (Signed)
My chart msg has been sent informing pt of the determination. Pt has a TOC w/ [facility]: 05-18-22

## 2022-04-16 NOTE — Telephone Encounter (Signed)
Received a fax regarding Prior Authorization from [place] for [facility] 0.'25mg'$ /0.20m.   Authorization has been DENIED because you must have been on a weight loss regimen consisting of a low calorie diet, increased physical activity and lifestyle changed for at least 6 months and while using Wegovy, must not have an FDA labeled contraindication to the drug and must not have a history of pancreatitis.  Phone# [phone] *Please be advised we currently do not have a pharmacist to review denials, therefore you will need to process appeals accordingly as needed. Thanks for your support at this time.

## 2022-05-18 ENCOUNTER — Ambulatory Visit (INDEPENDENT_AMBULATORY_CARE_PROVIDER_SITE_OTHER): Payer: BC Managed Care – PPO | Admitting: Family Medicine

## 2022-05-18 ENCOUNTER — Encounter: Payer: Self-pay | Admitting: Family Medicine

## 2022-05-18 VITALS — BP 118/70 | HR 85 | Temp 98.4°F | Ht 70.0 in | Wt 262.6 lb

## 2022-05-18 DIAGNOSIS — E0590 Thyrotoxicosis, unspecified without thyrotoxic crisis or storm: Secondary | ICD-10-CM | POA: Diagnosis not present

## 2022-05-18 DIAGNOSIS — F39 Unspecified mood [affective] disorder: Secondary | ICD-10-CM

## 2022-05-18 DIAGNOSIS — E559 Vitamin D deficiency, unspecified: Secondary | ICD-10-CM

## 2022-05-18 DIAGNOSIS — E538 Deficiency of other specified B group vitamins: Secondary | ICD-10-CM

## 2022-05-18 DIAGNOSIS — Z6835 Body mass index (BMI) 35.0-35.9, adult: Secondary | ICD-10-CM

## 2022-05-18 MED ORDER — CHOLECALCIFEROL 1.25 MG (50000 UT) PO CAPS: 50000.0000 [IU] | ORAL_CAPSULE | ORAL | 2 refills | Status: DC

## 2022-05-18 MED ORDER — CYANOCOBALAMIN 1000 MCG/ML IJ SOLN: 1000.0000 ug | INTRAMUSCULAR | 1 refills | Status: DC

## 2022-05-18 NOTE — Patient Instructions (Addendum)
It was a pleasure meeting you today. Thank you for allowing me to take part in your health care.  Our goals for today as we discussed include:  Refill sent for vitamin B12 and vitamin D  Schedule annual physical for 3 months  Follow-up with OB/GYN as scheduled   If you have any questions or concerns, please do not hesitate to call the office at [phone].  I look forward to our next visit and until then take care and stay safe.  Regards,   Dana Allan, MD   [facility]
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
## 2022-05-23 ENCOUNTER — Encounter: Payer: Self-pay | Admitting: Family Medicine

## 2022-05-23 DIAGNOSIS — F39 Unspecified mood [affective] disorder: Secondary | ICD-10-CM | POA: Insufficient documentation

## 2022-05-23 NOTE — Assessment & Plan Note (Addendum)
Chronic.  Asymptomatic.  Recent TSH within normal limits. Check TSH annually

## 2022-05-23 NOTE — Assessment & Plan Note (Signed)
Chronic.  Reviewed side effects of Wegovy with patient.  Given knowledge of low risk of medullary thyroid cancer seen and animal studies patient preferred not to continue with pursuit of medication. Encouraged healthy lifestyle, low carbohydrate diet, increase protein Increase water intake Encourage increase activity

## 2022-05-23 NOTE — Assessment & Plan Note (Signed)
Chronic.  Long-term use of Xanax, Atarax.  Previously tried SSRIs but did not help. Encouraged CBT Encouraged psychiatry referral, patient politely declined Discussed side effects of long-term use of Xanax including increased confusion, falls, association with cognitive decline with advancing age.  Patient would prefer to continue with current medication. Continue Xanax 0.5 mg twice daily as needed Continue Atarax 50 mg nightly Would caution with increasing medications given previous history of suicidal attempts using multiple medications.

## 2022-05-23 NOTE — Assessment & Plan Note (Signed)
Chronic.  On vitamin D supplements Refill vitamin D 1.25 mg weekly

## 2022-05-23 NOTE — Assessment & Plan Note (Signed)
Chronic.  True pernicious anemia, intrinsic factor antibodies positive. Continue vitamin B12 1000 mcg IM q. monthly

## 2022-06-24 ENCOUNTER — Encounter: Payer: BC Managed Care – PPO | Admitting: Family Medicine

## 2022-08-18 ENCOUNTER — Ambulatory Visit: Payer: BC Managed Care – PPO | Admitting: Family Medicine

## 2022-08-25 ENCOUNTER — Encounter: Payer: Self-pay | Admitting: Family Medicine

## 2022-08-25 ENCOUNTER — Ambulatory Visit (INDEPENDENT_AMBULATORY_CARE_PROVIDER_SITE_OTHER): Payer: BC Managed Care – PPO | Admitting: Family Medicine

## 2022-08-25 ENCOUNTER — Telehealth: Payer: Self-pay | Admitting: Family Medicine

## 2022-08-25 VITALS — BP 124/82 | HR 81 | Temp 98.5°F | Ht 70.0 in | Wt 255.0 lb

## 2022-08-25 DIAGNOSIS — D510 Vitamin B12 deficiency anemia due to intrinsic factor deficiency: Secondary | ICD-10-CM

## 2022-08-25 DIAGNOSIS — F39 Unspecified mood [affective] disorder: Secondary | ICD-10-CM | POA: Diagnosis not present

## 2022-08-25 DIAGNOSIS — N912 Amenorrhea, unspecified: Secondary | ICD-10-CM

## 2022-08-25 DIAGNOSIS — E559 Vitamin D deficiency, unspecified: Secondary | ICD-10-CM | POA: Diagnosis not present

## 2022-08-25 LAB — POCT URINE PREGNANCY: Preg Test, Ur: NEGATIVE

## 2022-08-25 MED ORDER — CHOLECALCIFEROL 1.25 MG (50000 UT) PO CAPS
50000.0000 [IU] | ORAL_CAPSULE | ORAL | 2 refills | Status: AC
Start: 2022-08-25 — End: ?

## 2022-08-25 MED ORDER — CYANOCOBALAMIN 1000 MCG/ML IJ SOLN
1000.0000 ug | INTRAMUSCULAR | 1 refills | Status: AC
Start: 2022-08-25 — End: ?

## 2022-08-25 NOTE — Assessment & Plan Note (Signed)
History of irregular menses.  Less likely pregnancy given TL and recent home pregnancy test negative.  Recheck UPT and negative Check TSH, FSH/LH, Prolactin Previously followed with OBGYN for irregular menses.  Can continue to follow Will follow up with lab results

## 2022-08-25 NOTE — Telephone Encounter (Signed)
Pt called stating she would like a TOC from [facility] to [facility] because the pt does not feel like [facility] is a good fit for her

## 2022-08-25 NOTE — Assessment & Plan Note (Signed)
Chronic.  On vitamin D supplements Refill vitamin D 1.25 mg weekly

## 2022-08-25 NOTE — Telephone Encounter (Signed)
Ok with me 

## 2022-08-25 NOTE — Assessment & Plan Note (Signed)
Chronic. Refill vitamin B12 1000 mcg IM q. monthly

## 2022-08-25 NOTE — Assessment & Plan Note (Signed)
Chronic.  Continue to follow up with psychiatry

## 2022-08-25 NOTE — Patient Instructions (Addendum)
It was a pleasure meeting you today. Thank you for allowing me to take part in your health care.  Our goals for today as we discussed include:  Will check hormones today  Follow up as needed  If you have any questions or concerns, please do not hesitate to call the office at [phone].  I look forward to our next visit and until then take care and stay safe.  Regards,   Dana Allan, MD   [facility]

## 2022-08-25 NOTE — Progress Notes (Signed)
   SUBJECTIVE:   Chief Complaint  Patient presents with   Follow-up    3 month f/u, pt states no other concerns   HPI Patient presents to clinic follow up for chronic disease management  Mood disorder Continues to follow up with psychiatry.  No acute concerns  Pernicious anemia Intrinsic factor antibodies positive Requesting refill Vitamin B 12  Obesity class II Concerned for increased weight.  Has been working out at gym for 1 year without much results.  Eats 2 meals day.  Not much protein. Recently started protein shakes.  Concerned for hormonal imbalance and would like hormones checked. LMP beginning of June.  History of TL.  Took 2 home pregnancy tests and negative.     PERTINENT PMH / PSH: History of migraine Mood disorder PTSD Obesity B12 deficiency IDA  History of maternal ovarian cancer at age 81. OBJECTIVE:  BP 124/82 (BP Location: Left Arm, Patient Position: Sitting, Cuff Size: Normal)   Pulse 81   Temp 98.5 F (36.9 C) (Oral)   Ht 5\' 10"  (1.778 m)   Wt 255 lb (115.7 kg)   LMP 07/03/2022   SpO2 98%   BMI 36.59 kg/m    Physical Exam Vitals reviewed.  Constitutional:      General: She is not in acute distress.    Appearance: She is obese. She is not ill-appearing or toxic-appearing.  HENT:     Head: Normocephalic.     Nose: Nose normal.  Eyes:     Conjunctiva/sclera: Conjunctivae normal.  Neck:     Thyroid: No thyromegaly or thyroid tenderness.  Cardiovascular:     Rate and Rhythm: Normal rate.  Pulmonary:     Effort: Pulmonary effort is normal.  Musculoskeletal:        General: Normal range of motion.     Cervical back: Normal range of motion.  Neurological:     Mental Status: She is alert and oriented to person, place, and time. Mental status is at baseline.  Psychiatric:        Mood and Affect: Mood normal.        Behavior: Behavior normal.        Thought Content: Thought content normal.        Judgment: Judgment normal.      ASSESSMENT/PLAN:  Amenorrhea Assessment & Plan: History of irregular menses.  Less likely pregnancy given TL and recent home pregnancy test negative.  Recheck UPT and negative Check TSH, FSH/LH, Prolactin Previously followed with OBGYN for irregular menses.  Can continue to follow Will follow up with lab results  Orders: -     POCT urine pregnancy -     FSH/LH -     Prolactin -     TSH  Vitamin D deficiency Assessment & Plan: Chronic.  On vitamin D supplements Refill vitamin D 1.25 mg weekly  Orders: -     Cholecalciferol; Take 1 capsule (50,000 Units total) by mouth once a week.  Dispense: 13 capsule; Refill: 2  Pernicious anemia Assessment & Plan: Chronic. Refill vitamin B12 1000 mcg IM q. monthly  Orders: -     Cyanocobalamin; Inject 1 mL (1,000 mcg total) into the muscle every 30 (thirty) days.  Dispense: 12 mL; Refill: 1  Mood disorder (HCC) Assessment & Plan: Chronic.  Continue to follow up with psychiatry     PDMP reviewed  Return if symptoms worsen or fail to improve, for PCP.  Dana Allan, MD

## 2022-08-26 LAB — TSH: TSH: 0.96 u[IU]/mL (ref 0.35–5.50)

## 2022-08-26 LAB — FSH/LH
FSH: 3.1 m[IU]/mL
LH: 5.3 m[IU]/mL

## 2022-08-26 LAB — PROLACTIN: Prolactin: 9.5 ng/mL

## 2022-09-14 NOTE — Telephone Encounter (Signed)
LVMTCB

## 2022-11-03 ENCOUNTER — Other Ambulatory Visit: Payer: Self-pay

## 2022-11-03 DIAGNOSIS — G4700 Insomnia, unspecified: Secondary | ICD-10-CM

## 2023-02-02 ENCOUNTER — Telehealth: Payer: Self-pay

## 2023-02-02 NOTE — Telephone Encounter (Signed)
 Patient has established care with Bodnar, Genevieve Norlander, MD   at [facility] medicine. She would need to contact them to ask if this could be done.

## 2023-02-02 NOTE — Telephone Encounter (Signed)
Copied from CRM [id number]. Topic: Clinical - Request for Lab/Test Order >> Feb 02, 2023  9:26 AM Steele Sizer wrote: Reason for CRM: Pt is requesting a callback regarding if she can come and get a TB skin test or quantiferon gold test.

## 2023-02-02 NOTE — Telephone Encounter (Signed)
 Left message to return call to our office.

## 2023-12-18 ENCOUNTER — Other Ambulatory Visit: Payer: Self-pay

## 2023-12-18 ENCOUNTER — Emergency Department

## 2023-12-18 ENCOUNTER — Emergency Department
Admission: EM | Admit: 2023-12-18 | Discharge: 2023-12-19 | Disposition: A | Discharge status: Home / Self Care | Attending: Emergency Medicine | Admitting: Emergency Medicine

## 2023-12-18 DIAGNOSIS — G43809 Other migraine, not intractable, without status migrainosus: Secondary | ICD-10-CM | POA: Diagnosis not present

## 2023-12-18 DIAGNOSIS — M542 Cervicalgia: Secondary | ICD-10-CM | POA: Diagnosis not present

## 2023-12-18 DIAGNOSIS — L03011 Cellulitis of right finger: Secondary | ICD-10-CM | POA: Insufficient documentation

## 2023-12-18 DIAGNOSIS — R42 Dizziness and giddiness: Secondary | ICD-10-CM

## 2023-12-18 LAB — URINALYSIS, ROUTINE W REFLEX MICROSCOPIC
Bacteria, UA: NONE SEEN
Bilirubin Urine: NEGATIVE
Glucose, UA: NEGATIVE mg/dL
Ketones, ur: NEGATIVE mg/dL
Leukocytes,Ua: NEGATIVE
Nitrite: NEGATIVE
Protein, ur: NEGATIVE mg/dL
Specific Gravity, Urine: 1.027 (ref 1.005–1.030)
pH: 5 (ref 5.0–8.0)

## 2023-12-18 LAB — CBC
HCT: 34.9 % — ABNORMAL LOW (ref 36.0–46.0)
Hemoglobin: 11 g/dL — ABNORMAL LOW (ref 12.0–15.0)
MCH: 28.1 pg (ref 26.0–34.0)
MCHC: 31.5 g/dL (ref 30.0–36.0)
MCV: 89 fL (ref 80.0–100.0)
Platelets: 292 K/uL (ref 150–400)
RBC: 3.92 MIL/uL (ref 3.87–5.11)
RDW: 13.9 % (ref 11.5–15.5)
WBC: 9.6 K/uL (ref 4.0–10.5)
nRBC: 0 % (ref 0.0–0.2)

## 2023-12-18 LAB — COMPREHENSIVE METABOLIC PANEL WITH GFR
ALT: 11 U/L (ref 0–44)
AST: 14 U/L — ABNORMAL LOW (ref 15–41)
Albumin: 4 g/dL (ref 3.5–5.0)
Alkaline Phosphatase: 75 U/L (ref 38–126)
Anion gap: 10 (ref 5–15)
BUN: 11 mg/dL (ref 6–20)
CO2: 21 mmol/L — ABNORMAL LOW (ref 22–32)
Calcium: 8.7 mg/dL — ABNORMAL LOW (ref 8.9–10.3)
Chloride: 108 mmol/L (ref 98–111)
Creatinine, Ser: 0.62 mg/dL (ref 0.44–1.00)
GFR, Estimated: >60 mL/min (ref >60)
Glucose, Bld: 130 mg/dL — ABNORMAL HIGH (ref 70–99)
Potassium: 3.7 mmol/L (ref 3.5–5.1)
Sodium: 138 mmol/L (ref 135–145)
Total Bilirubin: <0.2 mg/dL (ref 0.0–1.2)
Total Protein: 6.7 g/dL (ref 6.5–8.1)

## 2023-12-18 LAB — POC URINE PREG, ED: Preg Test, Ur: NEGATIVE

## 2023-12-18 NOTE — ED Triage Notes (Signed)
 Pt to ed from home via POV for dizziness x 1 week with HX of vertigo. Pt is caox4, in no acute distress and ambulatory in triage. Pt also has possible ingrown fingernail to be assessed,

## 2023-12-19 ENCOUNTER — Emergency Department

## 2023-12-19 MED ORDER — DEXAMETHASONE SOD PHOSPHATE PF 10 MG/ML IJ SOLN
10.0000 mg | Freq: Once | INTRAMUSCULAR | Status: AC
  Administered 2023-12-19: 10 mg via INTRAVENOUS

## 2023-12-19 MED ORDER — LIDOCAINE HCL (PF) 1 % IJ SOLN
10.0000 mL | Freq: Once | INTRAMUSCULAR | Status: AC
  Administered 2023-12-19: 10 mL
  Filled 2023-12-19: qty 10

## 2023-12-19 MED ORDER — SODIUM CHLORIDE 0.9 % IV BOLUS
1000.0000 mL | Freq: Once | INTRAVENOUS | Status: AC
  Administered 2023-12-19: 1000 mL via INTRAVENOUS

## 2023-12-19 MED ORDER — ACETAMINOPHEN 500 MG PO TABS
1000.0000 mg | ORAL_TABLET | Freq: Once | ORAL | Status: AC
  Administered 2023-12-19: 1000 mg via ORAL
  Filled 2023-12-19: qty 2

## 2023-12-19 MED ORDER — DIPHENHYDRAMINE HCL 50 MG/ML IJ SOLN
25.0000 mg | Freq: Once | INTRAMUSCULAR | Status: AC
  Administered 2023-12-19: 25 mg via INTRAVENOUS
  Filled 2023-12-19: qty 1

## 2023-12-19 MED ORDER — IOHEXOL 350 MG/ML SOLN
75.0000 mL | Freq: Once | INTRAVENOUS | Status: AC | PRN
  Administered 2023-12-19: 75 mL via INTRAVENOUS

## 2023-12-19 MED ORDER — PROCHLORPERAZINE EDISYLATE 10 MG/2ML IJ SOLN
10.0000 mg | Freq: Once | INTRAMUSCULAR | Status: AC
  Administered 2023-12-19: 10 mg via INTRAVENOUS
  Filled 2023-12-19: qty 2

## 2023-12-19 MED ORDER — MECLIZINE HCL 25 MG PO TABS
25.0000 mg | ORAL_TABLET | Freq: Once | ORAL | Status: AC
  Administered 2023-12-19: 25 mg via ORAL
  Filled 2023-12-19: qty 1

## 2023-12-19 MED ORDER — KETOROLAC TROMETHAMINE 15 MG/ML IJ SOLN
15.0000 mg | Freq: Once | INTRAMUSCULAR | Status: AC
  Administered 2023-12-19: 15 mg via INTRAVENOUS
  Filled 2023-12-19: qty 1

## 2023-12-19 NOTE — Discharge Instructions (Addendum)
 Fortunately your imaging did not show any dangerous conditions like blood vessel problems, blockages, stroke or bleeding.    Please follow-up with your neurologist in [place] for an appointment for your headache syndrome.  You may also call Dr. Lane who is local to [facility] for an appointment for your headaches.  Take acetaminophen  650 mg and ibuprofen  400 mg every 6 hours for pain.  Take with food.  Please keep your wound clean by washing at least daily with soap and water.  Apply antibiotic ointment daily. If you see any signs of infection like spreading redness, pus coming from the wound, extreme pain, fevers, chills or any other worsening doctor right away or come back to the emergency department   Thank you for choosing us  for your health care today!  Please see your primary doctor this week for a follow up appointment.   If you have any new, worsening, or unexpected symptoms call your doctor right away or come back to the emergency department for reevaluation.  It was my pleasure to care for you today.   Ginnie EDISON Cyrena, MD

## 2023-12-19 NOTE — ED Notes (Signed)
 Pt endorses dizziness x 1 week with headache and right hand 4th finger pain. Pt states dizziness worsens with movement.  I feel like I'm sitting in an ocean.  Denies head trauma.  States H/A has been constant for past week.  Was seen at [facility] on 11/11 and was told if it got worse to come to ED for CT/MRI. EDP Cyrena notified.

## 2023-12-19 NOTE — ED Notes (Signed)
 Patient transported to CT

## 2023-12-19 NOTE — ED Notes (Signed)
 EDP Cyrena notified that per pt, pain medication ineffective.

## 2023-12-19 NOTE — ED Notes (Signed)
 Patient transported to MRI

## 2023-12-19 NOTE — ED Provider Notes (Signed)
 [facility] Provider Note    Event Date/Time   First MD Initiated Contact with Patient 12/18/23 2353     (approximate)   History   Dizziness   HPI  Sandra Castaneda is a 38 y.o. female   Past medical history of anxiety and depression, chronic lower back pain, history of headaches with reported neurology doctor in [place] who has talked about Botox injections for headaches in the past, here with 1 week of gradual onset base of skull/left-sided head and neck pain associated with dizziness/vertigo.  She was seen as an outpatient about a week ago, and asked to come to the emergency department for advanced imaging should her symptoms continue to progress.  They have not gotten worse but they have not gotten better and now it has been 1 week.  She denies visual changes.  Denies trauma.  Denies fever or any other acute medical illnesses or complaints.  No motor or sensory changes.  She also has a paronychia on the fourth finger of the right.  No fever  External Medical Documents Reviewed: Prior outpatient notes      Physical Exam   Triage Vital Signs: ED Triage Vitals  Encounter Vitals Group     BP 12/18/23 2220 (!) 142/75     Girls Systolic BP Percentile --      Girls Diastolic BP Percentile --      Boys Systolic BP Percentile --      Boys Diastolic BP Percentile --      Pulse Rate 12/18/23 2220 89     Resp 12/18/23 2220 17     Temp 12/18/23 2220 98.6 F (37 C)     Temp Source 12/18/23 2220 Oral     SpO2 12/18/23 2220 100 %     Weight 12/18/23 2220 250 lb (113.4 kg)     Height 12/18/23 2211 5' 10 (1.778 m)     Head Circumference --      Peak Flow --      Pain Score 12/18/23 2211 2     Pain Loc --      Pain Education --      Exclude from Growth Chart --     Most recent vital signs: Vitals:   12/19/23 0253 12/19/23 0322  BP:    Pulse:    Resp:  16  Temp: 98.1 F (36.7 C)   SpO2:      General: Awake, no distress. CV:  Good  peripheral perfusion.  Resp:  Normal effort.  Abd:  No distention.  Other:  No midline tenderness to the C-spine, neck supple full range of motion, afebrile nontoxic-appearing.  Awake alert oriented pleasant woman in no acute distress.  No signs of head trauma.  Clear lungs, soft nontender abdomen, no focal neurologic deficits including facial asymmetry dysarthria finger-nose coordination or motor or sensory deficits.  Did not test her gait.   ED Results / Procedures / Treatments   Labs (all labs ordered are listed, but only abnormal results are displayed) Labs Reviewed  COMPREHENSIVE METABOLIC PANEL WITH GFR - Abnormal; Notable for the following components:      Result Value   CO2 21 (*)    Glucose, Bld 130 (*)    Calcium 8.7 (*)    AST 14 (*)    All other components within normal limits  CBC - Abnormal; Notable for the following components:   Hemoglobin 11.0 (*)    HCT 34.9 (*)    All other  components within normal limits  URINALYSIS, ROUTINE W REFLEX MICROSCOPIC - Abnormal; Notable for the following components:   Color, Urine YELLOW (*)    APPearance CLEAR (*)    Hgb urine dipstick SMALL (*)    All other components within normal limits  POC URINE PREG, ED     I ordered and reviewed the above labs they are notable for cell counts & electrolytes unremarkable.  EKG  ED ECG REPORT I, Ginnie Shams, the attending physician, personally viewed and interpreted this ECG.   Date: 12/19/2023  EKG Time: 2217  Rate: 92  Rhythm: sinus  Axis: nl  Intervals:nl  ST&T Change: no stemi    RADIOLOGY I independently reviewed and interpreted MRI of the brain and see no large area of ischemia or bleeding related I also reviewed radiologist's formal read.   PROCEDURES:  Critical Care performed: No  .Incision and Drainage  Date/Time: 12/19/2023 4:00 AM  Performed by: Shams Ginnie, MD Authorized by: Shams Ginnie, MD   Consent:    Consent obtained:  Verbal   Consent given by:   Patient   Risks discussed:  Bleeding, incomplete drainage, infection and damage to other organs   Alternatives discussed:  No treatment Universal protocol:    Procedure explained and questions answered to patient or proxy's satisfaction: yes     Patient identity confirmed:  Verbally with patient Location:    Indications for incision and drainage: Paronychia.   Size:  0.5   Location:  Upper extremity   Upper extremity location:  Finger   Finger location:  R ring finger Pre-procedure details:    Skin preparation:  Chlorhexidine  with alcohol Sedation:    Sedation type:  None Anesthesia:    Anesthesia method:  Nerve block   Block location:  Digital block   Block anesthetic:  Lidocaine  1% w/o epi   Block injection procedure:  Anatomic landmarks identified, introduced needle, anatomic landmarks palpated, negative aspiration for blood and incremental injection   Block outcome:  Anesthesia achieved Procedure type:    Complexity:  Simple Procedure details:    Incision types:  Stab incision   Drainage:  Purulent   Drainage amount:  Scant   Wound treatment:  Wound left open   Packing materials:  None Post-procedure details:    Procedure completion:  Tolerated    MEDICATIONS ORDERED IN ED: Medications  meclizine (ANTIVERT) tablet 25 mg (25 mg Oral Given 12/19/23 0133)  sodium chloride  0.9 % bolus 1,000 mL (1,000 mLs Intravenous New Bag/Given 12/19/23 0141)  prochlorperazine (COMPAZINE) injection 10 mg (10 mg Intravenous Given 12/19/23 0142)  diphenhydrAMINE  (BENADRYL ) injection 25 mg (25 mg Intravenous Given 12/19/23 0142)  ketorolac  (TORADOL ) 15 MG/ML injection 15 mg (15 mg Intravenous Given 12/19/23 0142)  acetaminophen  (TYLENOL ) tablet 1,000 mg (1,000 mg Oral Given 12/19/23 0132)  lidocaine  (PF) (XYLOCAINE ) 1 % injection 10 mL (10 mLs Other Given 12/19/23 0346)  iohexol  (OMNIPAQUE ) 350 MG/ML injection 75 mL (75 mLs Intravenous Contrast Given 12/19/23 0154)  dexamethasone   (DECADRON ) injection 10 mg (10 mg Intravenous Given 12/19/23 0254)     IMPRESSION / MDM / ASSESSMENT AND PLAN / ED COURSE  I reviewed the triage vital signs and the nursing notes.                                Patient's presentation is most consistent with acute presentation with potential threat to life or bodily function.  Differential diagnosis includes,  but is not limited to, vertebral or carotid dissection, intracranial bleeding, stroke, peripheral vertigo, complex migraine, paronychia.   The patient is on the cardiac monitor to evaluate for evidence of arrhythmia and/or significant heart rate changes.  MDM:    Concern for stroke or dissection given the quality and location of headache as well as the vertiginous symptoms, fortunately CT angiogram of the head and neck as well as MRI brain are negative for these pathologies.  Considered but less likely SAH given no thunderclap headache, gradual onset, location of pain.  Considered complex migraine, with her history of migraine headaches, should follow-up with neurologist for the previously discussed Botox injections.  Pain was mostly refractory to her migraine cocktail is here.  Paronychia drained without complication no evidence of advanced cellulitis will defer antibiotics.  Tolerated well.  I considered hospitalization for admission or observation however given unremarkable workup as above, plan will be for follow-up with neurologist for ongoing headache evaluation and management.        FINAL CLINICAL IMPRESSION(S) / ED DIAGNOSES   Final diagnoses:  Other migraine without status migrainosus, not intractable  Paronychia of finger of right hand  Dizziness     Rx / DC Orders   ED Discharge Orders     None        Note:  This document was prepared using Dragon voice recognition software and may include unintentional dictation errors.    Cyrena Mylar, MD 12/19/23 [phone]
